# Patient Record
Sex: Female | Born: 1970 | Race: Black or African American | Hispanic: No | Marital: Married | Smoking: Never smoker
Health system: Southern US, Community
[De-identification: ages and names within clinical notes are randomized; demographics above are authoritative.]

## PROBLEM LIST (undated history)

## (undated) DIAGNOSIS — F419 Anxiety disorder, unspecified: Secondary | ICD-10-CM

## (undated) DIAGNOSIS — R6889 Other general symptoms and signs: Secondary | ICD-10-CM

## (undated) DIAGNOSIS — K219 Gastro-esophageal reflux disease without esophagitis: Secondary | ICD-10-CM

## (undated) DIAGNOSIS — M238X9 Other internal derangements of unspecified knee: Secondary | ICD-10-CM

## (undated) DIAGNOSIS — J069 Acute upper respiratory infection, unspecified: Secondary | ICD-10-CM

## (undated) DIAGNOSIS — M5432 Sciatica, left side: Secondary | ICD-10-CM

## (undated) DIAGNOSIS — Z8632 Personal history of gestational diabetes: Secondary | ICD-10-CM

## (undated) DIAGNOSIS — I10 Essential (primary) hypertension: Secondary | ICD-10-CM

## (undated) DIAGNOSIS — IMO0002 Reserved for concepts with insufficient information to code with codable children: Secondary | ICD-10-CM

## (undated) DIAGNOSIS — E079 Disorder of thyroid, unspecified: Secondary | ICD-10-CM

## (undated) DIAGNOSIS — R011 Cardiac murmur, unspecified: Secondary | ICD-10-CM

## (undated) DIAGNOSIS — B9789 Other viral agents as the cause of diseases classified elsewhere: Secondary | ICD-10-CM

## (undated) DIAGNOSIS — I499 Cardiac arrhythmia, unspecified: Secondary | ICD-10-CM

## (undated) DIAGNOSIS — R002 Palpitations: Secondary | ICD-10-CM

## (undated) DIAGNOSIS — M170 Bilateral primary osteoarthritis of knee: Secondary | ICD-10-CM

## (undated) DIAGNOSIS — N912 Amenorrhea, unspecified: Secondary | ICD-10-CM

## (undated) DIAGNOSIS — N189 Chronic kidney disease, unspecified: Secondary | ICD-10-CM

## (undated) DIAGNOSIS — N390 Urinary tract infection, site not specified: Secondary | ICD-10-CM

## (undated) DIAGNOSIS — H9209 Otalgia, unspecified ear: Secondary | ICD-10-CM

## (undated) DIAGNOSIS — H6090 Unspecified otitis externa, unspecified ear: Secondary | ICD-10-CM

## (undated) DIAGNOSIS — R1013 Epigastric pain: Secondary | ICD-10-CM

## (undated) DIAGNOSIS — N926 Irregular menstruation, unspecified: Secondary | ICD-10-CM

## (undated) DIAGNOSIS — M533 Sacrococcygeal disorders, not elsewhere classified: Secondary | ICD-10-CM

## (undated) DIAGNOSIS — M545 Low back pain: Secondary | ICD-10-CM

## (undated) HISTORY — DX: Other viral agents as the cause of diseases classified elsewhere: B97.89

## (undated) HISTORY — DX: Epigastric pain: R10.13

## (undated) HISTORY — DX: Anxiety disorder, unspecified: F41.9

## (undated) HISTORY — DX: Essential (primary) hypertension: I10

## (undated) HISTORY — DX: Cardiac arrhythmia, unspecified: I49.9

## (undated) HISTORY — DX: Chronic kidney disease, unspecified: N18.9

## (undated) HISTORY — DX: Sciatica, left side: M54.32

## (undated) HISTORY — DX: Personal history of gestational diabetes: Z86.32

## (undated) HISTORY — DX: Gastro-esophageal reflux disease without esophagitis: K21.9

## (undated) HISTORY — DX: Unspecified otitis externa, unspecified ear: H60.90

## (undated) HISTORY — DX: Sacrococcygeal disorders, not elsewhere classified: M53.3

## (undated) HISTORY — DX: Reserved for concepts with insufficient information to code with codable children: IMO0002

## (undated) HISTORY — DX: Bilateral primary osteoarthritis of knee: M17.0

## (undated) HISTORY — DX: Disorder of thyroid, unspecified: E07.9

## (undated) HISTORY — DX: Urinary tract infection, site not specified: N39.0

## (undated) HISTORY — DX: Cardiac murmur, unspecified: R01.1

## (undated) HISTORY — DX: Other general symptoms and signs: R68.89

## (undated) HISTORY — DX: Palpitations: R00.2

## (undated) HISTORY — DX: Acute upper respiratory infection, unspecified: J06.9

## (undated) HISTORY — PX: UMBILICAL HERNIA REPAIR: SHX196

## (undated) HISTORY — DX: Otalgia, unspecified ear: H92.09

## (undated) HISTORY — DX: Other internal derangements of unspecified knee: M23.8X9

## (undated) HISTORY — DX: Irregular menstruation, unspecified: N92.6

## (undated) HISTORY — DX: Amenorrhea, unspecified: N91.2

## (undated) HISTORY — DX: Low back pain: M54.5

## (undated) HISTORY — PX: HERNIA REPAIR: SHX51

---

## 2005-10-29 ENCOUNTER — Ambulatory Visit: Payer: Self-pay | Admitting: Family Medicine

## 2006-01-14 ENCOUNTER — Ambulatory Visit: Payer: Self-pay | Admitting: Family Medicine

## 2006-01-14 ENCOUNTER — Other Ambulatory Visit: Admission: RE | Admit: 2006-01-14 | Discharge: 2006-01-14 | Payer: Self-pay | Admitting: Family Medicine

## 2006-03-26 ENCOUNTER — Ambulatory Visit (HOSPITAL_BASED_OUTPATIENT_CLINIC_OR_DEPARTMENT_OTHER): Admission: RE | Admit: 2006-03-26 | Discharge: 2006-03-26 | Payer: Self-pay | Admitting: Surgery

## 2008-04-27 ENCOUNTER — Ambulatory Visit: Payer: Self-pay | Admitting: Family Medicine

## 2008-07-27 ENCOUNTER — Ambulatory Visit: Payer: Self-pay | Admitting: Family Medicine

## 2010-09-04 ENCOUNTER — Ambulatory Visit: Payer: Self-pay | Admitting: Oncology

## 2010-09-12 ENCOUNTER — Ambulatory Visit: Payer: Self-pay | Admitting: Oncology

## 2010-10-05 ENCOUNTER — Ambulatory Visit: Payer: Self-pay | Admitting: Family Medicine

## 2010-10-19 ENCOUNTER — Ambulatory Visit: Payer: Self-pay | Admitting: Family Medicine

## 2011-05-22 ENCOUNTER — Ambulatory Visit: Payer: Self-pay | Admitting: General Surgery

## 2011-06-05 ENCOUNTER — Ambulatory Visit: Payer: Self-pay | Admitting: Family Medicine

## 2011-06-07 ENCOUNTER — Ambulatory Visit (INDEPENDENT_AMBULATORY_CARE_PROVIDER_SITE_OTHER): Payer: Self-pay | Admitting: Family Medicine

## 2011-06-07 ENCOUNTER — Encounter: Payer: Self-pay | Admitting: Family Medicine

## 2011-06-07 ENCOUNTER — Ambulatory Visit (INDEPENDENT_AMBULATORY_CARE_PROVIDER_SITE_OTHER)
Admission: RE | Admit: 2011-06-07 | Discharge: 2011-06-07 | Disposition: A | Discharge status: Home / Self Care | Payer: Self-pay | Source: Ambulatory Visit | Attending: Family Medicine | Admitting: Family Medicine

## 2011-06-07 ENCOUNTER — Other Ambulatory Visit (HOSPITAL_COMMUNITY)
Admission: RE | Admit: 2011-06-07 | Discharge: 2011-06-07 | Disposition: A | Discharge status: Home / Self Care | Payer: 59 | Source: Ambulatory Visit | Attending: Family Medicine | Admitting: Family Medicine

## 2011-06-07 ENCOUNTER — Other Ambulatory Visit: Payer: Self-pay | Admitting: Family Medicine

## 2011-06-07 ENCOUNTER — Encounter: Payer: Self-pay | Admitting: *Deleted

## 2011-06-07 VITALS — BP 110/70 | HR 67 | Temp 98.7°F | Ht 63.0 in | Wt 154.5 lb

## 2011-06-07 DIAGNOSIS — Z0000 Encounter for general adult medical examination without abnormal findings: Secondary | ICD-10-CM | POA: Insufficient documentation

## 2011-06-07 DIAGNOSIS — H60399 Other infective otitis externa, unspecified ear: Secondary | ICD-10-CM

## 2011-06-07 DIAGNOSIS — M545 Low back pain, unspecified: Secondary | ICD-10-CM

## 2011-06-07 DIAGNOSIS — H6090 Unspecified otitis externa, unspecified ear: Secondary | ICD-10-CM

## 2011-06-07 DIAGNOSIS — Z136 Encounter for screening for cardiovascular disorders: Secondary | ICD-10-CM

## 2011-06-07 DIAGNOSIS — Z1159 Encounter for screening for other viral diseases: Secondary | ICD-10-CM | POA: Insufficient documentation

## 2011-06-07 DIAGNOSIS — Z01419 Encounter for gynecological examination (general) (routine) without abnormal findings: Secondary | ICD-10-CM | POA: Insufficient documentation

## 2011-06-07 DIAGNOSIS — E079 Disorder of thyroid, unspecified: Secondary | ICD-10-CM | POA: Insufficient documentation

## 2011-06-07 HISTORY — DX: Unspecified otitis externa, unspecified ear: H60.90

## 2011-06-07 HISTORY — DX: Low back pain, unspecified: M54.50

## 2011-06-07 LAB — LIPID PANEL
Cholesterol: 124 mg/dL (ref 0–200)
HDL: 52.7 mg/dL (ref >39.00)
VLDL: 6 mg/dL (ref 0.0–40.0)

## 2011-06-07 LAB — BASIC METABOLIC PANEL
Calcium: 9.3 mg/dL (ref 8.4–10.5)
GFR: 97.34 mL/min (ref >60.00)
Potassium: 3.6 mEq/L (ref 3.5–5.1)
Sodium: 140 mEq/L (ref 135–145)

## 2011-06-07 LAB — TSH: TSH: 0.45 u[IU]/mL (ref 0.35–5.50)

## 2011-06-07 MED ORDER — CIPROFLOXACIN-DEXAMETHASONE 0.3-0.1 % OT SUSP: 4.0000 [drp] | Freq: Two times a day (BID) | OTIC | Status: AC

## 2011-06-07 NOTE — Patient Instructions (Signed)
Wonderful to meet you. We will call you with results of lab tests next week.

## 2011-06-07 NOTE — Progress Notes (Signed)
Subjective:    Patient ID: Jennifer Ware, female    DOB: 12-Aug-1971, 40 y.o.   MRN: [id number]  HPI  40 yo here female here to establish care and for CPX.  G3P3, no h/o abnormal pap smears. Had mammogram last week, normal.  Per pt, getting them every 6 months to follow some benign appearing calcifications (awaiting records).  Ear itching- started swimming a couple of months ago.  Since then, itching bilateral ear canals, right>left.  Sometimes painful to manipulation.  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  Noticed a knot at left lower lumbar area, becoming painful to touch and manipulation. At time, she feels some pain radiating to both sides, never down her legs. No LE weakness.  Patient Active Problem List  Diagnoses  . Thyroid disease  . Otitis externa  . Low back pain  . Routine general medical examination at a health care facility   Past Medical History  Diagnosis Date  . Thyroid disease   . Hx gestational diabetes    No past surgical history on file. History  Substance Use Topics  . Smoking status: Never Smoker   . Smokeless tobacco: Not on file  . Alcohol Use: Not on file   No family history on file. No Known Allergies No current outpatient prescriptions on file prior to visit.   The PMH, PSH, Social History, Family History, Medications, and allergies have been reviewed in [facility], and have been updated if relevant.    Review of Systems See HPI Patient reports no  vision/ hearing changes,anorexia, weight change, fever ,adenopathy, persistant / recurrent hoarseness, swallowing issues, chest pain, edema,persistant / recurrent cough, hemoptysis, dyspnea(rest, exertional, paroxysmal nocturnal), gastrointestinal  bleeding (melena, rectal bleeding), abdominal pain, excessive heart burn, GU symptoms(dysuria, hematuria, pyuria, voiding/incontinence  Issues) syncope, focal weakness, severe memory loss, concerning skin lesions, depression,  anxiety, abnormal bruising/bleeding, major joint swelling, breast masses or abnormal vaginal bleeding.       Objective:   Physical Exam BP 110/70  Pulse 67  Temp(Src) 98.7 F (37.1 C) (Oral)  Ht 5\' 3"  (1.6 m)  Wt 154 lb 8 oz (70.081 kg)  BMI 27.37 kg/m2  LMP 06/04/2011  General:  Well-developed,well-nourished,in no acute distress; alert,appropriate and cooperative throughout examination Head:  normocephalic and atraumatic.   Eyes:  vision grossly intact, pupils equal, pupils round, and pupils reactive to light.   Ears:  Crusting and erythema of extrernal canals bilaterally, mild tenderness of manipulation   Nose:  no external deformity.   Mouth:  good dentition.   Neck:  No deformities, masses, or tenderness noted. Breasts:  No mass, nodules, thickening, tenderness, bulging, retraction, inflamation, nipple discharge or skin changes noted.   Lungs:  Normal respiratory effort, chest expands symmetrically. Lungs are clear to auscultation, no crackles or wheezes. Heart:  Normal rate and regular rhythm. S1 and S2 normal without gallop, murmur, click, rub or other extra sounds. Abdomen:  Bowel sounds positive,abdomen soft and non-tender without masses, organomegaly or hernias noted. Rectal:  no external abnormalities.   Genitalia:  Pelvic Exam:        External: normal female genitalia without lesions or masses        Vagina: normal without lesions or masses        Cervix: normal without lesions or masses        Adnexa: normal bimanual exam without masses or fullness        Uterus: normal by palpation  Pap smear: performed Msk: palpable firm area, left LSC, neg slr bilaterally Extremities:  No clubbing, cyanosis, edema, or deformity noted with normal full range of motion of all joints.   Neurologic:  alert & oriented X3 and gait normal.   Skin:  Intact without suspicious lesions or rashes Cervical Nodes:  No lymphadenopathy noted Axillary Nodes:  No palpable  lymphadenopathy Psych:  Cognition and judgment appear intact. Alert and cooperative with normal attention span and concentration. No apparent delusions, illusions, hallucinations    Assessment & Plan:   1. Routine general medical examination at a health care facility  Reviewed preventive care protocols, scheduled due services, and updated immunizations Discussed nutrition, exercise, diet, and healthy lifestyle.  Cytology -Pap Smear, Basic Metabolic Panel (BMET)  2. Otitis externa   New.  Ciprodex x 7 days.  Advised trying to keep ears dry after swimming. The patient indicates understanding of these issues and agrees with the plan.    3. Low back pain   Unclear if this is cyst vs muscle spasm. Will get xray to evaluate further. DG Lumbar Spine Complete

## 2011-06-12 ENCOUNTER — Telehealth: Payer: Self-pay | Admitting: Family Medicine

## 2011-06-12 ENCOUNTER — Encounter: Payer: Self-pay | Admitting: *Deleted

## 2011-06-12 NOTE — Telephone Encounter (Signed)
That is a good question and I'm not sure. It felt muscular to me especially since the pain can go into here legs.  I cannot say for sure that it is not a cyst. We could try to do an ultrasound for further evaluation. It is good that the xray was negative.

## 2011-06-12 NOTE — Telephone Encounter (Signed)
Called Pt to schedule Ortho appt. Patient says that you were unclear whether the knot on her back was a cyst so you recommended the xray to confirm. After having the xray now its still not clear what the knot is. She feels like she needs to see a different Dr and not an Ortho. A Dr told her once that it was a cyst, could you pls advise her how to confirm what the knot is. Derm or Surgeon she was thinking but wanted to hear it from you.

## 2011-06-13 NOTE — Telephone Encounter (Signed)
Noted.  It should be for 7-14 days depending on how she is feeling. OK to stop after 7 days if symptoms improved.

## 2011-06-13 NOTE — Telephone Encounter (Signed)
Patient advised as instructed via telephone.  She will continue the drops for another 7 days and contact us if she is no better.

## 2011-06-13 NOTE — Telephone Encounter (Signed)
Called patient to give her your response about the knot on her back. She now wants to think about the [place] and will call me back. But she also asked me to ask you how long she should use the antibiotic ear drops, it just tells her how many drops to put in each ear but not for how long to use it? Pls advise.

## 2011-07-18 ENCOUNTER — Telehealth: Payer: Self-pay | Admitting: Internal Medicine

## 2011-07-18 DIAGNOSIS — H9209 Otalgia, unspecified ear: Secondary | ICD-10-CM

## 2011-07-18 DIAGNOSIS — H6090 Unspecified otitis externa, unspecified ear: Secondary | ICD-10-CM

## 2011-07-18 HISTORY — DX: Otalgia, unspecified ear: H92.09

## 2011-07-18 NOTE — Telephone Encounter (Signed)
Referral placed.

## 2011-07-18 NOTE — Telephone Encounter (Signed)
Patient advised as instructed via telephone.  She would like a referral to ENT.  She will wait to hear from [facility].

## 2011-07-18 NOTE — Telephone Encounter (Signed)
Patient called and stated she finished her antibiotic but both of her ears are still itching and would like to know what to do because the problem is still there.  Please advise.

## 2011-07-18 NOTE — Telephone Encounter (Signed)
Left message on machine at home for patient to return call. 

## 2011-07-18 NOTE — Telephone Encounter (Signed)
I last saw her in October so I would need to re evaluate her ears to be able to advise.

## 2011-09-18 ENCOUNTER — Ambulatory Visit: Payer: 59 | Admitting: Family Medicine

## 2011-09-24 ENCOUNTER — Encounter: Payer: Self-pay | Admitting: Family Medicine

## 2011-09-24 ENCOUNTER — Ambulatory Visit (INDEPENDENT_AMBULATORY_CARE_PROVIDER_SITE_OTHER): Payer: 59 | Admitting: Family Medicine

## 2011-09-24 VITALS — BP 122/80 | HR 69 | Temp 98.1°F | Ht 65.0 in | Wt 155.5 lb

## 2011-09-24 DIAGNOSIS — Z020 Encounter for examination for admission to educational institution: Secondary | ICD-10-CM

## 2011-09-24 DIAGNOSIS — Z0289 Encounter for other administrative examinations: Secondary | ICD-10-CM

## 2011-09-24 LAB — POCT URINALYSIS DIPSTICK
Blood, UA: NEGATIVE
Ketones, UA: NEGATIVE
Protein, UA: NEGATIVE
Spec Grav, UA: 1.01

## 2011-09-24 NOTE — Progress Notes (Signed)
  Subjective:    Patient ID: Jennifer Ware, female    DOB: Dec 12, 1970, 41 y.o.   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  Brings forms with her today. Requires UA and UTD immunizations.    Patient Active Problem List  Diagnoses  . Thyroid disease  . Otitis externa  . Low back pain  . Routine general medical examination at a health care facility  . Ear discomfort   Past Medical History  Diagnosis Date  . Thyroid disease   . Hx gestational diabetes    No past surgical history on file. History  Substance Use Topics  . Smoking status: Never Smoker   . Smokeless tobacco: Not on file  . Alcohol Use: Not on file   No family history on file. No Known Allergies Current Outpatient Prescriptions on File Prior to Visit  Medication Sig Dispense Refill  . Multiple Vitamin (MULTIVITAMIN) tablet Take 1 tablet by mouth daily.         The PMH, PSH, Social History, Family History, Medications, and allergies have been reviewed in [facility], and have been updated if relevant.    Review of Systems See HPI      Objective:   Physical Exam BP 122/80  Pulse 69  Temp(Src) 98.1 F (36.7 C) (Oral)  Ht 5\' 5"  (1.651 m)  Wt 155 lb 8 oz (70.534 kg)  BMI 25.88 kg/m2  General:  Well-developed,well-nourished,in no acute distress; alert,appropriate and cooperative throughout examination Head:  normocephalic and atraumatic.   Eyes:  vision grossly intact, pupils equal, pupils round, and pupils reactive to light.   Nose:  no external deformity.   Mouth:  good dentition.   Neck:  No deformities, masses, or tenderness noted. Breasts:  No mass, nodules, thickening, tenderness, bulging, retraction, inflamation, nipple discharge or skin changes noted.   Lungs:  Normal respiratory effort, chest expands symmetrically. Lungs are clear to auscultation, no crackles or wheezes. Heart:  Normal rate and regular rhythm. S1 and S2 normal without gallop, murmur,  click, rub or other extra sounds. Extremities:  No clubbing, cyanosis, edema, or deformity noted with normal full range of motion of all joints.   Neurologic:  alert & oriented X3 and gait normal.   Skin:  Intact without suspicious lesions or rashes Psych:  Cognition and judgment appear intact. Alert and cooperative with normal attention span and concentration. No apparent delusions, illusions, hallucinations    Assessment & Plan:   1.  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 Immunzations UTD. Forms filled out and returned to pt.

## 2011-09-24 NOTE — Progress Notes (Signed)
Addended by: Gilmer Mor on: 09/24/2011 10:00 AM   Modules accepted: Orders

## 2011-10-22 ENCOUNTER — Ambulatory Visit (INDEPENDENT_AMBULATORY_CARE_PROVIDER_SITE_OTHER): Payer: 59 | Admitting: Family Medicine

## 2011-10-22 ENCOUNTER — Encounter: Payer: Self-pay | Admitting: Family Medicine

## 2011-10-22 VITALS — BP 120/80 | HR 64 | Temp 98.7°F | Wt 160.0 lb

## 2011-10-22 DIAGNOSIS — R300 Dysuria: Secondary | ICD-10-CM

## 2011-10-22 DIAGNOSIS — N912 Amenorrhea, unspecified: Secondary | ICD-10-CM | POA: Insufficient documentation

## 2011-10-22 DIAGNOSIS — R1013 Epigastric pain: Secondary | ICD-10-CM

## 2011-10-22 HISTORY — DX: Amenorrhea, unspecified: N91.2

## 2011-10-22 LAB — POCT URINALYSIS DIPSTICK
Bilirubin, UA: NEGATIVE
Ketones, UA: NEGATIVE
Spec Grav, UA: >=1.03
pH, UA: 6

## 2011-10-22 LAB — POCT URINE PREGNANCY: Preg Test, Ur: NEGATIVE

## 2011-10-22 MED ORDER — ESOMEPRAZOLE MAGNESIUM 40 MG PO CPDR: 40.0000 mg | DELAYED_RELEASE_CAPSULE | Freq: Every day | ORAL | Status: DC

## 2011-10-22 MED ORDER — CIPROFLOXACIN HCL 500 MG PO TABS: 500.0000 mg | ORAL_TABLET | Freq: Two times a day (BID) | ORAL | Status: AC

## 2011-10-22 NOTE — Progress Notes (Signed)
SUBJECTIVE: Jennifer Ware is a 41 y.o. female who complains of urinary frequency, urgency and dysuria x 10 days, without flank pain, fever, chills, or abnormal vaginal discharge or bleeding.   LMP was 09/15/11.  Also, when stomach is empty, feels a "burning sensation" in her stomach and throat. No nausea, vomiting, fevers or diarrhea.  Patient Active Problem List  Diagnoses  . Thyroid disease  . Otitis externa  . Low back pain  . Routine general medical examination at a health care facility  . Ear discomfort  . Amenorrhea   Past Medical History  Diagnosis Date  . Thyroid disease   . Hx gestational diabetes    No past surgical history on file. History  Substance Use Topics  . Smoking status: Never Smoker   . Smokeless tobacco: Not on file  . Alcohol Use: Not on file   No family history on file. No Known Allergies Current Outpatient Prescriptions on File Prior to Visit  Medication Sig Dispense Refill  . Multiple Vitamin (MULTIVITAMIN) tablet Take 1 tablet by mouth daily.         The PMH, PSH, Social History, Family History, Medications, and allergies have been reviewed in [facility], and have been updated if relevant.  OBJECTIVE:  BP 120/80  Pulse 64  Temp(Src) 98.7 F (37.1 C) (Oral)  Wt 160 lb (72.576 kg)  Appears well, in no apparent distress.  Vital signs are normal. The abdomen is soft without tenderness, guarding, mass, rebound or organomegaly. No CVA tenderness or inguinal adenopathy noted. Urine dipstick shows positive for RBC's and positive for leukocytes.    ASSESSMENT/Plan  1.  Dysuria- :UTI uncomplicated without evidence of pyelonephritis-  cipro 500 mg twice daily x 3 days   - also push fluids, may use Pyridium OTC prn. Call or return to clinic prn if these symptoms worsen or fail to improve as anticipated.  2.  Amenorrhea- ? Perimenopause.  Advised to keep journal and we will also want to repeat her Upreg in a few weeks. The patient indicates understanding of  these issues and agrees with the plan.   3.  Dyspepsia-  Trial of nexium 40 mg daily (samples given). See pt instructions for details.

## 2011-10-22 NOTE — Patient Instructions (Signed)
Good to see you. Take your cipro as directed for your urinary tract infection. Increase fiber and water, and try over the counter gas-x or beano for bloating.  Nexium- 1 tablet daily  for next 2 weeks. Let me know how you are feeling in two weeks.

## 2011-10-28 ENCOUNTER — Telehealth: Payer: Self-pay | Admitting: Family Medicine

## 2011-10-28 NOTE — Telephone Encounter (Signed)
[facility] Triage Call Report Triage Record Num: [id number] Operator: Jennifer Ware Patient Name: Jennifer Ware Call Date & Time: 10/28/2011 11:34:06AM Patient Phone: [phone] PCP: Jennifer Ware Patient Gender: Female PCP Fax : [phone] Patient DOB: 08/25/1970 Practice Name: [facility] Reason for Call: Caller: Jennifer Ware/Patient; PCP: Jennifer Ware Nestor Ramp); CB#: [phone]; ; ; Call regarding follow up Reflux Indigestion; states has been taking nexium for ~ 1 week with no improvement in symptoms. Was triaged 10/25/11 and note was sent to office for callback by staff for possible workin appt, but did not hear back from staff at all. Would like appt today if possible. Per Epic, no appts available today; appt sched 10/29/11 0900 with Dr. Dayton Ware. Protocol(s) Used: Heartburn Recommended Outcome per Protocol: See Provider within 24 hours Reason for Outcome: Provider diagnosed heartburn AND symptoms not responding to recommended treatment plan Care Advice: Call EMS 911 if having any of the following symptoms: chest, neck, jaw, arm, shoulder, or upper back pain; or shortness of breath at rest. ~ Do not take aspirin, ibuprofen, ketoprofen, naproxen, etc., or other pain relieving medications until consulting with provider. ~ Talk to provider immediately if having repeated episodes pain/discomfort in lower chest or back; indigestion not relieved with antacids; shortness of breath without chest pain; or unexplained fatigue, weakness or anxiety. ~ Call provider immediately if develop severe pain, black, tarry stools, bloody stools, blood-streaked or coffee ground-looking vomitus, or abdomen swollen. ~ ~ SYMPTOM / CONDITION MANAGEMENT ~ CAUTIONS Discontinue nonprescribed medications and complementary/alternative medications. Continue prescribed medication(s) at ordered dosage/frequency until discussed with provider. ~ Heartburn Relief (Dietary): - Avoid overeating. -  Eat smaller, more frequent meals and chew each bite thoroughly. - Avoid high fat, spicy or gas-producing foods. - Limit liquids/foods that are gastric irritants (fruit juices, caffeinated, carbonated or alcoholic beverages, chocolate and dairy products). - Eat at least three hours before bedtime. ~ Heartburn Relief (Positioning): - Avoid bending over at the waist or lying flat soon after eating. - Avoid clothing that is tight around the abdomen and waist. - Sleeping on stacked pillows or raising the head of bed on 6 inch blocks may help prevent reflux. ~ 03/

## 2011-10-29 ENCOUNTER — Encounter: Payer: Self-pay | Admitting: Family Medicine

## 2011-10-29 ENCOUNTER — Other Ambulatory Visit: Payer: Self-pay | Admitting: Family Medicine

## 2011-10-29 ENCOUNTER — Ambulatory Visit (INDEPENDENT_AMBULATORY_CARE_PROVIDER_SITE_OTHER): Payer: 59 | Admitting: Family Medicine

## 2011-10-29 ENCOUNTER — Encounter: Payer: Self-pay | Admitting: Gastroenterology

## 2011-10-29 DIAGNOSIS — R768 Other specified abnormal immunological findings in serum: Secondary | ICD-10-CM

## 2011-10-29 DIAGNOSIS — R1013 Epigastric pain: Secondary | ICD-10-CM

## 2011-10-29 DIAGNOSIS — R1084 Generalized abdominal pain: Secondary | ICD-10-CM | POA: Insufficient documentation

## 2011-10-29 DIAGNOSIS — R109 Unspecified abdominal pain: Secondary | ICD-10-CM

## 2011-10-29 LAB — COMPREHENSIVE METABOLIC PANEL
AST: 30 U/L (ref 0–37)
Albumin: 3.9 g/dL (ref 3.5–5.2)
Alkaline Phosphatase: 64 U/L (ref 39–117)
Glucose, Bld: 97 mg/dL (ref 70–99)
Potassium: 4.2 mEq/L (ref 3.5–5.1)
Sodium: 138 mEq/L (ref 135–145)
Total Protein: 7.1 g/dL (ref 6.0–8.3)

## 2011-10-29 MED ORDER — AMOXICILLIN 500 MG PO CAPS: 1000.0000 mg | ORAL_CAPSULE | Freq: Two times a day (BID) | ORAL | Status: AC

## 2011-10-29 MED ORDER — CLARITHROMYCIN 500 MG PO TABS: 500.0000 mg | ORAL_TABLET | Freq: Two times a day (BID) | ORAL | Status: AC

## 2011-10-29 NOTE — Progress Notes (Signed)
Addended by: Consuello Masse on: 10/29/2011 04:14 PM   Modules accepted: Orders

## 2011-10-29 NOTE — Progress Notes (Signed)
SUBJECTIVE: Jennifer Ware is a 41 y.o. female here for follow up.  Evaluated on 10/22/2011 with complaint of urinary frequency, urgency and dysuria x 10 days, without flank pain, fever, chills, or abnormal vaginal discharge or bleeding.  UA pos for LE- treated with 3 day course of cipro. Those symptoms have resolved.  Dyspepsia- was also complaining of a "burning sensation" in her stomach and throat when he stomach is empty. No nausea, vomiting, fevers or diarrhea.  No dark stools. Given samples of nexium and advised 2 week trial. She is here today after 1 week trial and burning no improved.  Also now has new symptoms- sharp attacks of diffuse abdominal pain that radiates to her back.  Last for seconds then goes away. When asked where she feels it, she points to her entire abdomen and pelvis.  Periods have also been very irregular- neg U preg last week.  Patient Active Problem List  Diagnoses  . Thyroid disease  . Otitis externa  . Low back pain  . Routine general medical examination at a health care facility  . Ear discomfort  . Amenorrhea  . Dyspepsia   Past Medical History  Diagnosis Date  . Thyroid disease   . Hx gestational diabetes    No past surgical history on file. History  Substance Use Topics  . Smoking status: Never Smoker   . Smokeless tobacco: Not on file  . Alcohol Use: Not on file   No family history on file. No Known Allergies Current Outpatient Prescriptions on File Prior to Visit  Medication Sig Dispense Refill  . ciprofloxacin (CIPRO) 500 MG tablet Take 1 tablet (500 mg total) by mouth 2 (two) times daily.  6 tablet  0  . esomeprazole (NEXIUM) 40 MG capsule Take 1 capsule (40 mg total) by mouth daily.  30 capsule  3  . Multiple Vitamin (MULTIVITAMIN) tablet Take 1 tablet by mouth daily.         The PMH, PSH, Social History, Family History, Medications, and allergies have been reviewed in [facility], and have been updated if relevant.  OBJECTIVE:  BP 120/70   Pulse 86  Temp(Src) 97.9 F (36.6 C) (Oral)  Ht 5\' 3"  (1.6 m)  Wt 159 lb 12.8 oz (72.485 kg)  BMI 28.31 kg/m2  SpO2 99%  Appears well, in no apparent distress.  Vital signs are normal. The abdomen is soft without tenderness, guarding, mass, rebound or organomegaly. No CVA tenderness or inguinal adenopathy noted.     ASSESSMENT/Plan 1. Abdominal pain  US Abdomen Complete, CT Abdomen Pelvis W Contrast, Comprehensive metabolic panel   Differential is wide and etiology unclear- less likely PUD given that PPI has not helped with symptoms. Given the intense and diffuse nature of pain, will order CT of abdomen and pelvis to evaluate further. CMET.

## 2011-10-29 NOTE — Patient Instructions (Signed)
Good to you. Please stop by to see Jennifer Ware on your way out.

## 2011-10-30 ENCOUNTER — Other Ambulatory Visit: Payer: 59

## 2011-10-31 ENCOUNTER — Telehealth: Payer: Self-pay | Admitting: *Deleted

## 2011-10-31 NOTE — Telephone Encounter (Signed)
On my desk

## 2011-10-31 NOTE — Telephone Encounter (Signed)
Pt has brought in a 2nd school form that she needs completed.  I filled in immunizations yesterday, now she needs the doctor's side completed.  Form is on your desk.

## 2011-11-19 ENCOUNTER — Telehealth: Payer: Self-pay | Admitting: Family Medicine

## 2011-11-19 DIAGNOSIS — IMO0002 Reserved for concepts with insufficient information to code with codable children: Secondary | ICD-10-CM

## 2011-11-19 NOTE — Telephone Encounter (Signed)
Advised pt, she agrees to referral, prefers to see someone in [place].

## 2011-11-19 NOTE — Telephone Encounter (Signed)
Noted. Will place referral to GYN.

## 2011-11-19 NOTE — Telephone Encounter (Signed)
Left message asking that patient call back 

## 2011-11-19 NOTE — Telephone Encounter (Signed)
Caller: Aliegha/Patient; PCP: Ruthe Mannan (Nestor Ramp); CB#: [phone]; ; ; Call regarding Vaginal Pain With Intercourse; Has Done All Suggestions by Dr. Dayton Martes; seen in office 10/29/11.  States would like referral to specialist if Dr. Dayton Martes believes it is necessary.  States has excruciating pain on intercourse.  Declines new triage or followup appt at this time.   INFO TO OFFICE FOR PROVIDER REVIEW/REFERRAL/CALLBACK.  MAY REACH PATIENT AT [phone] home, or [phone] cell.

## 2011-11-22 ENCOUNTER — Ambulatory Visit: Payer: Self-pay | Admitting: General Surgery

## 2011-11-25 ENCOUNTER — Encounter: Payer: Self-pay | Admitting: Gastroenterology

## 2011-11-25 ENCOUNTER — Ambulatory Visit (INDEPENDENT_AMBULATORY_CARE_PROVIDER_SITE_OTHER): Payer: 59 | Admitting: Gastroenterology

## 2011-11-25 ENCOUNTER — Ambulatory Visit (INDEPENDENT_AMBULATORY_CARE_PROVIDER_SITE_OTHER): Payer: 59

## 2011-11-25 ENCOUNTER — Other Ambulatory Visit: Payer: 59

## 2011-11-25 VITALS — BP 110/70 | HR 72 | Ht 63.0 in | Wt 160.6 lb

## 2011-11-25 DIAGNOSIS — A048 Other specified bacterial intestinal infections: Secondary | ICD-10-CM

## 2011-11-25 DIAGNOSIS — R52 Pain, unspecified: Secondary | ICD-10-CM

## 2011-11-25 DIAGNOSIS — R1013 Epigastric pain: Secondary | ICD-10-CM

## 2011-11-25 DIAGNOSIS — Z111 Encounter for screening for respiratory tuberculosis: Secondary | ICD-10-CM

## 2011-11-25 NOTE — Progress Notes (Signed)
HPI: This is a  very pleasant 41 year old woman who was born in [place]  Starting about 1 month ago.  She had these symptoms in past but thought it was gas pain.  Eating when very hungry will cause burning, distension.  Tried gas ex type medicines.  Eventually went to PCP, H. Pylori abx for 10 days.  She noticed an improvement after the abx (only one episode of pain since then) still has burning.   Currently not on PPI.  She noticed a mild loosening of her stools after the antibiotics. Not exactly diarrhea. Intermittent tylenol only.  No nsaids.  No caffeine.  Blood tests last month showed H. pylori antigen was positive. She was put on Prevpac type antibiotics.  Review of systems: Pertinent positive and negative review of systems were noted in the above HPI section. Complete review of systems was performed and was otherwise normal.    Past Medical History  Diagnosis Date  . Thyroid disease   . Hx gestational diabetes     Past Surgical History  Procedure Date  . Cesarean section   . Umbilical hernia repair     Current Outpatient Prescriptions  Medication Sig Dispense Refill  . Multiple Vitamin (MULTIVITAMIN) tablet Take 1 tablet by mouth daily.          Allergies as of 11/25/2011  . (No Known Allergies)    Family History  Problem Relation Age of Onset  . Asthma Father     History   Social History  . Marital Status: Married    Spouse Name: N/A    Number of Children: 3  . Years of Education: N/A   Occupational History  . LPN    Social History Main Topics  . Smoking status: Never Smoker   . Smokeless tobacco: Never Used  . Alcohol Use: No  . Drug Use: No  . Sexually Active: Not on file   Other Topics Concern  . 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         Physical Exam: BP 110/70  Pulse 72  Ht 5\' 3"  (1.6 m)  Wt 160 lb 9.6 oz (72.848 kg)  BMI  28.45 kg/m2  LMP 11/11/2011 Constitutional: generally well-appearing Psychiatric: alert and oriented x3 Eyes: extraocular movements intact Mouth: oral pharynx moist, no lesions Neck: supple no lymphadenopathy Cardiovascular: heart regular rate and rhythm Lungs: clear to auscultation bilaterally Abdomen: soft, nontender, nondistended, no obvious ascites, no peritoneal signs, normal bowel sounds Extremities: no lower extremity edema bilaterally Skin: no lesions on visible extremities    Assessment and plan: 41 y.o. female with  dyspepsia, abdominal burning, H. pylori positive by serology.  Prevpac type antibiotics clearly helped her symptoms but have not completely resolved them. She is still bothered by burning periodically. We will proceed with EGD at her soonest convenience to check for persistent H. pyloric, peptic ulcer disease. I recommended she try once daily proton pump inhibitor for now.   I explained to her the antibiotics will often change the character of the bowel movements but this is usually temporary. She is not describing significant diarrhea that is generally seen with Clostridium difficile. I recommended she just observe her bowels and if they worsen or do not get back to normal the next few weeks and we consider stool testing.

## 2011-11-25 NOTE — Patient Instructions (Signed)
You will be set up for an upper endoscopy in [facility] for epiagstric pain, burning. Start once daily prevacid or prilosec or protonix (or their generic equivalent). Best taken 20-30 min before breakfast meal.

## 2011-11-26 ENCOUNTER — Encounter: Payer: 59 | Admitting: Obstetrics & Gynecology

## 2011-11-27 ENCOUNTER — Encounter: Payer: Self-pay | Admitting: Obstetrics & Gynecology

## 2011-11-27 ENCOUNTER — Telehealth: Payer: Self-pay | Admitting: Gastroenterology

## 2011-11-27 ENCOUNTER — Encounter: Payer: Self-pay | Admitting: *Deleted

## 2011-11-27 ENCOUNTER — Ambulatory Visit (INDEPENDENT_AMBULATORY_CARE_PROVIDER_SITE_OTHER): Payer: 59 | Admitting: Obstetrics & Gynecology

## 2011-11-27 VITALS — BP 114/78 | HR 72 | Ht 63.0 in | Wt 155.0 lb

## 2011-11-27 DIAGNOSIS — IMO0002 Reserved for concepts with insufficient information to code with codable children: Secondary | ICD-10-CM

## 2011-11-27 DIAGNOSIS — N941 Unspecified dyspareunia: Secondary | ICD-10-CM

## 2011-11-27 MED ORDER — NITROFURANTOIN MONOHYD MACRO 100 MG PO CAPS: ORAL_CAPSULE | ORAL | Status: DC

## 2011-11-27 NOTE — Telephone Encounter (Signed)
no

## 2011-11-27 NOTE — Patient Instructions (Signed)
Dyspareunia Dyspareunia is pain during sexual intercourse. It is most common in women, but it also happens in men.  CAUSES  Female The pain from this condition is usually felt when anything is put into the vagina, but any part of the genitals may cause pain during sex. Even sitting or wearing pants can cause pain. Sometimes, a cause cannot be found. Some causes of pain during intercourse are:  Infections of the skin around the vagina.   Vaginal infections, such as a yeast, bacterial, or viral infection.   Vaginismus. This is the inability to have anything put in the vagina even when the woman wants it to happen. There is an automatic muscle contraction and pain. The pain of the muscle contraction can be so severe that intercourse is impossible.   Allergic reaction from spermicides, semen, condoms, scented tampons, soaps, douches, and vaginal sprays.   A fluid-filled sac (cyst) on the Bartholin or Skene glands, located at the opening of the vagina.   Scar tissue in the vagina from a surgically enlarged opening (episiotomy) or tearing after delivering a baby.   Vaginal dryness. This is more common in menopause. The normal secretions of the vagina are decreased. Changes in estrogen levels and increased difficulty becoming aroused can cause painful sex. Vaginal dryness can also happen when taking birth control pills.   Thinning of the tissue (atrophy) of the vulva and vagina. This makes the area thinner, smaller, unable to stretch to accommodate a penis, and prone to infection and tearing.   Vulvar vestibulitis or vestibulodynia.This is a condition that causes pain involving the area around the entrance to the vagina.The most common cause in young women is birth control pills.Women with low estrogen levels (postmenopausal women) may also experience this.Other causes include allergic reactions, too many nerve endings, skin conditions, and pelvic muscles that cannot relax.   Vulvar dermatoses.  This includes skin conditions such as lichen sclerosus and lichen planus.   Lack of foreplay to lubricate the vagina. This can cause vaginal dryness.   Noncancerous tumors (fibroids) in the uterus.   Uterus lining tissue growing outside the uterus (endometriosis).   Pregnancy that starts in the fallopian tube (tubal pregnancy).   Pregnancy or breastfeeding your baby. This can cause vaginal dryness.   A tilting or prolapse of the uterus. Prolapse is when weak and stretched muscles around the uterus allow it to fall into the vagina.   Problems with the ovaries, cysts, or scar tissue. This may be worse with certain sexual positions.   Previous surgeries causing adhesions or scar tissue in the vagina or pelvis.   Bladder and intestinal problems.   Psychological problems (such as depression or anxiety). This may make pain worse.   Negative attitudes about sex, experiencing rape, sexual assault, and misinformation about sex. These issues are often related to some types of pain.   Previous pelvic infection, causing scar tissue in the pelvis and on the female organs.   Cyst or tumor on the ovary.   Cancer of the female organs.   Certain medicines.   Medical problems such as diabetes, arthritis, or thyroid disease.  DIAGNOSIS   Your caregiver will take a history and have you describe where the pain is located (outside the vagina, in the vagina, in the pelvis). You may be asked when you experience pain, such as with penetration or with thrusting.   Following this, your caregiver will do a physical exam. Let your caregiver know if the exam is too painful.  During the final part of the female exam, your caregiver will feel your uterus and ovaries with one hand on the abdomen and one finger in your vagina. This is a pelvic exam.   Blood tests, a Pap test, cultures for infection, an ultrasound test, and X-rays may be done. You may need to see a specialist for female problems  (gynecologist).   Your caregiver may do a CT scan, MRI, or laparoscopy. Laparoscopy is a procedure to look into the pelvis with a lighted tube, through a cut (incision) in the abdomen.  TREATMENT  Your caregiver can help you determine the best course of treatment. Sometimes, more testing is done. Continue with the suggested testing until your caregiver feels sure about your diagnosis and how to treat it. Sometimes, it is difficult to find the reason for the pain. The search for the cause and treatment can be frustrating. Treatment often takes several weeks to a few months before you notice any improvement. You may also need to avoid sexual activity until symptoms improve.Continuing to have sex when it hurts can delay healing and actually make the problem worse. The treatment depends on the cause of the pain. Treatment may include:  Medicines such as antibiotics, vaginal or skin creams, hormones, or antidepressants.   Minor or major surgery.   Psychological counseling or group therapy.   Kegel exercises and vaginal dilators to help certain cases of vaginismus (spasms). Do this only if recommended by your caregiver.Kegel exercises can make some problems worse.   Applying lubrication as recommended by your caregiver if you have dryness.   Sex therapy for you and your sex partner.  It is common for the pain to continue after the reason for the pain has been treated. Some reasons for this include a conditioned response. This means the person having the pain becomes so familiar with the pain that the pain continues as a response, even though the cause is removed. Sex therapy can help with this problem. HOME CARE INSTRUCTIONS   Follow your caregiver's instructions about taking medicines, tests, counseling, and follow-up treatment.   Do not use scented tampons, douches, vaginal sprays, or soaps.   Use water-based lubricants for dryness. Oil lubricants can cause irritation.   Do not use  spermicides or condoms that irritate you.   Openly discuss with your partner your sexual experience, your desires, foreplay, and different sexual positions for a more comfortable and enjoyable sexual relationship.   Join group sessions for therapy, if needed.   Practice safe sex at all times.   Empty your bladder before having intercourse.   Try different positions during sexual intercourse.   Take over-the-counter pain medicine recommended by your caregiver before having sexual intercourse.   Do not wear pantyhose. Knee-high and thigh-high hose are okay.   Avoid scrubbing your vulva with a washcloth. Wash the area gently and pat dry with a towel.  SEEK MEDICAL CARE IF:   You develop vaginal bleeding after sexual intercourse.   You develop a lump at the opening of your vagina, even if it is not painful.   You have abnormal vaginal discharge.   You have vaginal dryness.   You have itching or irritation of the vulva or vagina.   You develop a rash or reaction to your medicine.  SEEK IMMEDIATE MEDICAL CARE IF:   You develop severe abdominal pain during or shortly after sexual intercourse. You could have a ruptured ovarian cyst or ruptured tubal pregnancy.   You have a fever.  You have painful or bloody urination.   You have painful sexual intercourse, and you never had it before.   You pass out after having sexual intercourse.  Document Released: 08/18/2007 Document Revised: 07/18/2011 Document Reviewed: 10/29/2010 [facility] Patient Information 2012 [place], [place].

## 2011-11-27 NOTE — Progress Notes (Signed)
History:  41 y.o. [id number] here today for evaluation of dyspareunia and an irregular menstrual period last month.  Dyspareunia has been present since her teenage years, pain is anterior and happens after intercourse.  She reports also being diagnosed with UTIs after intercourse needing multiple doses of antibiotics; reports she was suppression therapy with Macrobid for one year several years ago. Pain is not associated with fevers, nausea, vomiting, or other systemic symptoms. As for her irregular menstrual period; her period last month was two weeks late, had negative urine HCG; next period came around usual time of the months which was only two weeks after the irregular period.  LMP 11/11/11.  No other irregular bleeding, abdominal pain or other concerns.  The following portions of the patient's history were reviewed and updated as appropriate: allergies, current medications, past family history, past medical history, past social history, past surgical history and problem list.  Review of Systems:  Pertinent items are noted in HPI.  Objective:  Physical Exam Blood pressure 114/78, pulse 72, height 5\' 3"  (1.6 m), weight 155 lb (70.308 kg), last menstrual period 11/11/2011. Gen: NAD Abd: Soft, nontender and nondistended Pelvic: Normal appearing external genitalia; normal appearing vaginal mucosa and cervix.  Good bladder and rectum support. No lesions seen. Normal discharge.   Unable to palpate uterus/adnexae secondary to voluntary guarding. No palpable abnormal masses.  Assessment & Plan:  1) UTIs after intercourse: Patient was advised that this can be treated by taking antibiotics with each act of intercourse; Macrobid 100 mg recommended. Also recommended emptying bladder after intercourse.  If UTIs persist, may need evaluation by cystoscopy.  Will also get pelvic ultrasound given equivocal pelvic examination to rule out structural anomalies. 2) Irregular menstrual period x 1: Likely isolated  anovulatory bleeding, will follow up pelvic ultrasound for further evaluation.  If continues, will need endometrial biopsy.  Bleeding precautions reviewed.

## 2011-11-29 ENCOUNTER — Other Ambulatory Visit: Payer: 59 | Admitting: Gastroenterology

## 2011-11-29 ENCOUNTER — Ambulatory Visit (HOSPITAL_COMMUNITY): Payer: 59

## 2011-12-05 ENCOUNTER — Telehealth: Payer: Self-pay | Admitting: Family Medicine

## 2011-12-05 NOTE — Telephone Encounter (Signed)
I am out of the office until Monday but will review it when I get back.  I have not heard an irregular heartbeat when I evaluated her but these arrythmias can come and go.  Let's have her come in to see me and we can do an EKG.

## 2011-12-05 NOTE — Telephone Encounter (Signed)
Pt is returning from an appt with General Surgeon Dr. Evette Cristal on [address]. He advised pt during exam she had an irregular heart beat. He advised her to f/u with her MD. Pt states is is sending over his evaluation. She wants to know if it has arrived yet. Pt wants to know the next step. Pt is asymptomatic at this time. Rn agreed to send this info to MD and advised pt to f/u.

## 2011-12-06 ENCOUNTER — Ambulatory Visit (AMBULATORY_SURGERY_CENTER): Payer: 59 | Admitting: Gastroenterology

## 2011-12-06 ENCOUNTER — Encounter: Payer: Self-pay | Admitting: Gastroenterology

## 2011-12-06 ENCOUNTER — Other Ambulatory Visit: Payer: 59 | Admitting: Gastroenterology

## 2011-12-06 ENCOUNTER — Telehealth: Payer: Self-pay | Admitting: Family Medicine

## 2011-12-06 VITALS — BP 117/78 | HR 76 | Temp 97.5°F | Resp 22

## 2011-12-06 DIAGNOSIS — K2970 Gastritis, unspecified, without bleeding: Secondary | ICD-10-CM

## 2011-12-06 DIAGNOSIS — A048 Other specified bacterial intestinal infections: Secondary | ICD-10-CM

## 2011-12-06 DIAGNOSIS — K2960 Other gastritis without bleeding: Secondary | ICD-10-CM

## 2011-12-06 DIAGNOSIS — R1013 Epigastric pain: Secondary | ICD-10-CM

## 2011-12-06 DIAGNOSIS — R52 Pain, unspecified: Secondary | ICD-10-CM

## 2011-12-06 MED ORDER — SODIUM CHLORIDE 0.9 % IV SOLN: 500.0000 mL | INTRAVENOUS | Status: DC

## 2011-12-06 NOTE — Patient Instructions (Addendum)
You received fentanyl and versed during today's procedure/test. One of these is a narcotic pain medicine and the other is a benzodiazepine similar to valium.  Discharge instructions given with verbal understanding. Handout on Gastritis given. Resume previous medications.YOU HAD AN ENDOSCOPIC PROCEDURE TODAY AT THE [facility]: Refer to the procedure report that was given to you for any specific questions about what was found during the examination.  If the procedure report does not answer your questions, please call your gastroenterologist to clarify.  If you requested that your care partner not be given the details of your procedure findings, then the procedure report has been included in a sealed envelope for you to review at your convenience later.  YOU SHOULD EXPECT: Some feelings of bloating in the abdomen. Passage of more gas than usual.  Walking can help get rid of the air that was put into your GI tract during the procedure and reduce the bloating. If you had a lower endoscopy (such as a colonoscopy or flexible sigmoidoscopy) you may notice spotting of blood in your stool or on the toilet paper. If you underwent a bowel prep for your procedure, then you may not have a normal bowel movement for a few days.  DIET: Your first meal following the procedure should be a light meal and then it is ok to progress to your normal diet.  A half-sandwich or bowl of soup is an example of a good first meal.  Heavy or fried foods are harder to digest and may make you feel nauseous or bloated.  Likewise meals heavy in dairy and vegetables can cause extra gas to form and this can also increase the bloating.  Drink plenty of fluids but you should avoid alcoholic beverages for 24 hours.  ACTIVITY: Your care partner should take you home directly after the procedure.  You should plan to take it easy, moving slowly for the rest of the day.  You can resume normal activity the day after the procedure however  you should NOT DRIVE or use heavy machinery for 24 hours (because of the sedation medicines used during the test).    SYMPTOMS TO REPORT IMMEDIATELY: A gastroenterologist can be reached at any hour.  During normal business hours, 8:30 AM to 5:00 PM Monday through Friday, call [phone].  After hours and on weekends, please call the GI answering service at [phone] who will take a message and have the physician on call contact you.   Following upper endoscopy (EGD)  Vomiting of blood or coffee ground material  New chest pain or pain under the shoulder blades  Painful or persistently difficult swallowing  New shortness of breath  Fever of 100F or higher  Black, tarry-looking stools  FOLLOW UP: If any biopsies were taken you will be contacted by phone or by letter within the next 1-3 weeks.  Call your gastroenterologist if you have not heard about the biopsies in 3 weeks.  Our staff will call the home number listed on your records the next business day following your procedure to check on you and address any questions or concerns that you may have at that time regarding the information given to you following your procedure. This is a courtesy call and so if there is no answer at the home number and we have not heard from you through the emergency physician on call, we will assume that you have returned to your regular daily activities without incident.  SIGNATURES/CONFIDENTIALITY: You and/or your care partner  have signed paperwork which will be entered into your electronic medical record.  These signatures attest to the fact that that the information above on your After Visit Summary has been reviewed and is understood.  Full responsibility of the confidentiality of this discharge information lies with you and/or your care-partner.

## 2011-12-06 NOTE — Telephone Encounter (Signed)
Caller: Colleen/Patient; PCP: Ruthe Mannan (Nestor Ramp); CB#: [phone]; Call regarding; Pt missed a call back from the ofc. Per EPIC: Delilah Shan, CMA left msg. Per Dr. Jovita Gamma, may sched pt an EKG to evaluate an irr heart beat noted per her surgeon. Pls call.

## 2011-12-06 NOTE — Op Note (Signed)
[facility] [address] [place], [place]  [address]  ENDOSCOPY PROCEDURE REPORT  PATIENT:  Jennifer, Ware  MR#:  [id number] BIRTHDATE:  1971/04/02, 40 yrs. old  GENDER:  female ENDOSCOPIST:  Rachael Fee, MD Referred by:  Ruthe Mannan, M.D. PROCEDURE DATE:  12/06/2011 PROCEDURE:  EGD with biopsy, 43239 ASA CLASS:  Class II INDICATIONS:  GERD like dyspepsia, recent H. pylori serology +, treated with prevpac Abx but still with symptoms MEDICATIONS:  Fentanyl 75 mcg IV, These medications were titrated to patient response per physician's verbal order, Versed 8 mg IV TOPICAL ANESTHETIC:  Cetacaine Spray  DESCRIPTION OF PROCEDURE:   After the risks benefits and alternatives of the procedure were thoroughly explained, informed consent was obtained.  The LB GIF-H180 [id number] endoscope was introduced through the mouth and advanced to the second portion of the duodenum, without limitations.  The instrument was slowly withdrawn as the mucosa was fully examined. <<PROCEDUREIMAGES>> There was mild, non-specific gastritis. Biopsies taken and sent to pathology (jar 1) (see image3).  Otherwise the examination was normal (see image1, image2, image4, and image5).    Retroflexed views revealed no abnormalities.    The scope was then withdrawn from the patient and the procedure completed. COMPLICATIONS:  None  ENDOSCOPIC IMPRESSION: 1) Mild gastritis, biopsies taken to check for residual h. pylori 2) Otherwise normal examination  RECOMMENDATIONS: 1) Await pathology results  ______________________________ Rachael Fee, MD  n. eSIGNED:   Rachael Fee at 12/06/2011 01:44 PM  [place]ris[place], [id number]

## 2011-12-06 NOTE — Telephone Encounter (Signed)
LMOVM to return call.

## 2011-12-06 NOTE — Progress Notes (Signed)
Patient did not experience any of the following events: a burn prior to discharge; a fall within the facility; wrong site/side/patient/procedure/implant event; or a hospital transfer or hospital admission upon discharge from the facility. (G8907) Patient did not have preoperative order for IV antibiotic SSI prophylaxis. (G8918)  

## 2011-12-06 NOTE — Telephone Encounter (Signed)
Please make sure she has appt scheduled for EKG and follow up

## 2011-12-09 ENCOUNTER — Telehealth: Payer: Self-pay

## 2011-12-09 NOTE — Telephone Encounter (Signed)
  Follow up Call-  Call back number 12/06/2011  Post procedure Call Back phone  # [phone]  Permission to leave phone message Yes     Patient questions:  Do you have a fever, pain , or abdominal swelling? no Pain Score  0 *  Have you tolerated food without any problems? yes  Have you been able to return to your normal activities? yes  Do you have any questions about your discharge instructions: Diet   no Medications  no Follow up visit  no  Do you have questions or concerns about your Care? no  Actions: * If pain score is 4 or above: No action needed, pain <4.

## 2011-12-09 NOTE — Telephone Encounter (Signed)
Spoke with patient.  Appt scheduled for tomorrow morning

## 2011-12-10 ENCOUNTER — Encounter: Payer: Self-pay | Admitting: Family Medicine

## 2011-12-10 ENCOUNTER — Encounter: Payer: Self-pay | Admitting: Gastroenterology

## 2011-12-10 ENCOUNTER — Ambulatory Visit (INDEPENDENT_AMBULATORY_CARE_PROVIDER_SITE_OTHER): Payer: 59 | Admitting: Family Medicine

## 2011-12-10 VITALS — BP 120/90 | HR 68 | Temp 98.4°F | Wt 156.0 lb

## 2011-12-10 DIAGNOSIS — I499 Cardiac arrhythmia, unspecified: Secondary | ICD-10-CM

## 2011-12-10 NOTE — Progress Notes (Signed)
  Subjective:    Patient ID: Jennifer Ware, female    DOB: 15-Jan-1971, 41 y.o.   MRN: [id number]  HPI  41 yo here for ? Abnormal heart rhythm.  Was at another physician's office, Dr  Evette Cristal and was told that she had an abnormal heart rhythm. Was told to follow up with PCP.  No CP, SOB, dizziness, blurred vision. No DOE.  NO h/o cardiac arythmias.  Patient Active Problem List  Diagnoses  . Thyroid disease  . Otitis externa  . Low back pain  . Ear discomfort  . Amenorrhea  . Dyspepsia  . Abdominal pain  . Abnormal heart rhythm   Past Medical History  Diagnosis Date  . Thyroid disease   . Hx gestational diabetes   . Anxiety    Past Surgical History  Procedure Date  . Umbilical hernia repair   . Hernia repair     umbilical  . Cesarean section     x3   History  Substance Use Topics  . Smoking status: Never Smoker   . Smokeless tobacco: Never Used  . Alcohol Use: No   Family History  Problem Relation Age of Onset  . Asthma Father   . Colon cancer Neg Hx    No Known Allergies Current Outpatient Prescriptions on File Prior to Visit  Medication Sig Dispense Refill  . Cranberry 1000 MG CAPS Take 500 mg by mouth once.      . Fluocinolone Acetonide 0.01 % OIL       . Multiple Vitamin (MULTIVITAMIN) tablet Take 1 tablet by mouth daily.        . nitrofurantoin, macrocrystal-monohydrate, (MACROBID) 100 MG capsule Take as directed in office  30 capsule  5  . vitamin C (ASCORBIC ACID) 500 MG tablet Take 500 mg by mouth daily.      Review of Systems See HPI     Objective:   Physical Exam BP 120/90  Pulse 68  Temp(Src) 98.4 F (36.9 C) (Oral)  Wt 156 lb (70.761 kg)  LMP 11/11/2011  General:  Well-developed,well-nourished,in no acute distress; alert,appropriate and cooperative throughout examination Head:  normocephalic and atraumatic.   Nose:  no external deformity.   Mouth:  good dentition.   Neck:  No deformities, masses, or tenderness noted. Breasts:  No  mass, nodules, thickening, tenderness, bulging, retraction, inflamation, nipple discharge or skin changes noted.   Lungs:  Normal respiratory effort, chest expands symmetrically. Lungs are clear to auscultation, no crackles or wheezes. Heart:  Normal rate and regular rhythm. S1 and S2 normal without gallop, murmur, click, rub or other extra sounds. Psych:  Cognition and judgment appear intact. Alert and cooperative with normal attention span and concentration. No apparent delusions, illusions, hallucinations     Assessment & Plan:   1. Abnormal heart rhythm  EKG 12-Lead   Reassurance provided- NSR on exam and EKG. EKG does show occasional PACs which may be what Dr. Evette Cristal heard on exam. No further work up needed.

## 2011-12-13 ENCOUNTER — Ambulatory Visit (HOSPITAL_COMMUNITY): Payer: 59

## 2011-12-17 ENCOUNTER — Ambulatory Visit: Payer: 59 | Admitting: Obstetrics & Gynecology

## 2011-12-19 ENCOUNTER — Ambulatory Visit (HOSPITAL_COMMUNITY)
Admission: RE | Admit: 2011-12-19 | Discharge: 2011-12-19 | Disposition: A | Discharge status: Home / Self Care | Payer: 59 | Source: Ambulatory Visit | Attending: Obstetrics & Gynecology | Admitting: Obstetrics & Gynecology

## 2011-12-19 DIAGNOSIS — N941 Unspecified dyspareunia: Secondary | ICD-10-CM

## 2011-12-19 DIAGNOSIS — IMO0002 Reserved for concepts with insufficient information to code with codable children: Secondary | ICD-10-CM | POA: Insufficient documentation

## 2011-12-19 DIAGNOSIS — N949 Unspecified condition associated with female genital organs and menstrual cycle: Secondary | ICD-10-CM | POA: Insufficient documentation

## 2011-12-19 DIAGNOSIS — N938 Other specified abnormal uterine and vaginal bleeding: Secondary | ICD-10-CM | POA: Insufficient documentation

## 2011-12-19 DIAGNOSIS — D259 Leiomyoma of uterus, unspecified: Secondary | ICD-10-CM | POA: Insufficient documentation

## 2011-12-23 ENCOUNTER — Telehealth: Payer: Self-pay | Admitting: Gastroenterology

## 2011-12-23 NOTE — Telephone Encounter (Signed)
Pt was notified that a letter was mailed to her with her results.  I did read her the letter with the results.  The pt still complained of epigastric pain and burning.  She is taking omeprazole once daily.  I advised her to increase to twice daily and call back if no better in a week.  Pt agreed

## 2011-12-24 ENCOUNTER — Encounter: Payer: Self-pay | Admitting: Family Medicine

## 2011-12-24 ENCOUNTER — Ambulatory Visit (INDEPENDENT_AMBULATORY_CARE_PROVIDER_SITE_OTHER): Payer: 59 | Admitting: Family Medicine

## 2011-12-24 VITALS — BP 111/82 | HR 77 | Ht 63.0 in | Wt 157.0 lb

## 2011-12-24 DIAGNOSIS — N390 Urinary tract infection, site not specified: Secondary | ICD-10-CM | POA: Insufficient documentation

## 2011-12-24 HISTORY — DX: Urinary tract infection, site not specified: N39.0

## 2011-12-24 NOTE — Patient Instructions (Signed)

## 2011-12-24 NOTE — Progress Notes (Signed)
  Subjective:    Patient ID: Jennifer Ware, female    DOB: 21-Feb-1971, 41 y.o.   MRN: [id number]  HPI Returns for f/u.  Previously seen and treated by Dr. Macon Large for recurrent UTI following intercourse and dyspareunia.  Placed on macrobid po x 1 post intercourse.  Pelvic sonogram was negative.  Pt. Reports no abd. Pain since starting on Macrobid.  She is concerned about chronic use.   Review of Systems Neg. For fever, chills, nausea,vomiting, abd or pelvic pain.    Objective:   Physical Exam  Alert, cooperative, NAD Abd-soft, NT     Assessment & Plan:   1. Chronic UTI    Sx's improved with Macrobid prophylaxis.  Will continue for now.  Reassured about chronic use of medication which is primarily concentrated and excreted in urine.  Additionally, she is using so sporadically, should be fine.

## 2013-06-21 ENCOUNTER — Encounter: Payer: 59 | Admitting: Internal Medicine

## 2013-06-21 ENCOUNTER — Encounter: Payer: Self-pay | Admitting: Internal Medicine

## 2013-06-21 ENCOUNTER — Ambulatory Visit (INDEPENDENT_AMBULATORY_CARE_PROVIDER_SITE_OTHER): Payer: BC Managed Care – PPO | Admitting: Internal Medicine

## 2013-06-21 VITALS — BP 112/78 | HR 64 | Temp 98.1°F | Ht 63.0 in | Wt 158.5 lb

## 2013-06-21 DIAGNOSIS — M791 Myalgia, unspecified site: Secondary | ICD-10-CM

## 2013-06-21 DIAGNOSIS — IMO0001 Reserved for inherently not codable concepts without codable children: Secondary | ICD-10-CM

## 2013-06-21 DIAGNOSIS — Z0000 Encounter for general adult medical examination without abnormal findings: Secondary | ICD-10-CM

## 2013-06-21 DIAGNOSIS — R002 Palpitations: Secondary | ICD-10-CM

## 2013-06-21 MED ORDER — FLUOCINOLONE ACETONIDE 0.01 % OT OIL: 2.0000 [drp] | TOPICAL_OIL | Freq: Two times a day (BID) | OTIC | Status: DC

## 2013-06-21 MED ORDER — DICLOFENAC SODIUM 1 % TD GEL: 2.0000 g | Freq: Four times a day (QID) | TRANSDERMAL | Status: DC

## 2013-06-21 NOTE — Patient Instructions (Signed)

## 2013-06-21 NOTE — Progress Notes (Signed)
Pre-visit discussion using our clinic review tool. No additional management support is needed unless otherwise documented below in the visit note.  

## 2013-06-21 NOTE — Progress Notes (Signed)
Subjective:    Patient ID: Jennifer Ware, female    DOB: 03-22-1971, 42 y.o.   MRN: [id number]  HPI  Pt presents to the clinic today for her annual exam. She does c/o frequent anxiety attacks and heart palpitations. Her last ECG was 04/2013. She wants a referral to a cardiologist. She does not want to take anything for her anxiety because she doesn't want to be "dependant upon a pill".  Flu: 05/2013 Tetanus: 2012 LMP: 06/2013 Pap Smear: 2013 Mammogram: 2013 Eye Doctor: 2012 Dentist: 2012  Review of Systems      Past Medical History  Diagnosis Date  . Thyroid disease   . Hx gestational diabetes   . Anxiety     Current Outpatient Prescriptions  Medication Sig Dispense Refill  . BIOTIN 5000 PO Take by mouth daily.      . Calcium 600-200 MG-UNIT per tablet Take 2 tablets by mouth daily.      . Cranberry 1000 MG CAPS Take 500 mg by mouth as needed.       . Fluocinolone Acetonide 0.01 % OIL       . Multiple Vitamins-Minerals (ANTIOXIDANT PO) Take by mouth daily.      . nitrofurantoin, macrocrystal-monohydrate, (MACROBID) 100 MG capsule as needed. Take as directed in office      . vitamin C (ASCORBIC ACID) 500 MG tablet Take 500 mg by mouth daily.      . Multiple Vitamin (MULTIVITAMIN) tablet Take 1 tablet by mouth daily.         No current facility-administered medications for this visit.    No Known Allergies  Family History  Problem Relation Age of Onset  . Asthma Father   . Colon cancer Neg Hx     History   Social History  . Marital Status: Married    Spouse Name: N/A    Number of Children: 3  . Years of Education: N/A   Occupational History  . LPN    Social History Main Topics  . Smoking status: Never Smoker   . Smokeless tobacco: Never Used  . Alcohol Use: No  . Drug Use: No  . Sexual Activity: Yes    Partners: Male    Birth Control/ Protection: None   Other Topics Concern  . Not on file   Social History Narrative   Originally from [place].   Has  lived in area for 14 years.   3 children.   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       Constitutional: Denies fever, malaise, fatigue, headache or abrupt weight changes.  HEENT: Denies eye pain, eye redness, ear pain, ringing in the ears, wax buildup, runny nose, nasal congestion, bloody nose, or sore throat. Respiratory: Denies difficulty breathing, shortness of breath, cough or sputum production.   Cardiovascular: Denies chest pain, chest tightness, palpitations or swelling in the hands or feet.  Gastrointestinal: Denies abdominal pain, bloating, constipation, diarrhea or blood in the stool.  GU: Denies urgency, frequency, pain with urination, burning sensation, blood in urine, odor or discharge. Musculoskeletal: Denies decrease in range of motion, difficulty with gait, muscle pain or joint pain and swelling.  Skin: Denies redness, rashes, lesions or ulcercations.  Neurological: Denies dizziness, difficulty with memory, difficulty with speech or problems with balance and coordination.   No other specific complaints in a complete review of systems (except as listed in HPI above).  Objective:   Physical Exam  BP 112/78  Pulse 64  Temp(Src) 98.1 F (36.7  C) (Oral)  Ht 5\' 3"  (1.6 m)  Wt 158 lb 8 oz (71.895 kg)  BMI 28.08 kg/m2  LMP 06/20/2013 Wt Readings from Last 3 Encounters:  06/21/13 158 lb 8 oz (71.895 kg)  12/24/11 157 lb (71.215 kg)  12/10/11 156 lb (70.761 kg)    General: Appears her stated age, well developed, well nourished in NAD. Skin: Warm, dry and intact. No rashes, lesions or ulcerations noted. HEENT: Head: normal shape and size; Eyes: sclera white, no icterus, conjunctiva pink, PERRLA and EOMs intact; Ears: Tm's gray and intact, normal light reflex; Nose: mucosa pink and moist, septum midline; Throat/Mouth: Teeth present, mucosa pink and moist, no exudate, lesions or ulcerations noted.  Neck: Normal range of motion. Neck supple, trachea midline. No massses,  lumps or present. Thyromegaly noted. Cardiovascular: Normal rate and rhythm. S1,S2 noted.  No murmur, rubs or gallops noted. No JVD or BLE edema. No carotid bruits noted. Pulmonary/Chest: Normal effort and positive vesicular breath sounds. No respiratory distress. No wheezes, rales or ronchi noted.  Abdomen: Soft and nontender. Normal bowel sounds, no bruits noted. No distention or masses noted. Liver, spleen and kidneys non palpable. Musculoskeletal: Normal range of motion. No signs of joint swelling. No difficulty with gait.  Neurological: Alert and oriented. Cranial nerves II-XII intact. Coordination normal. +DTRs bilaterally. Psychiatric: Mood and affect normal. Behavior is normal. Judgment and thought content normal.          Assessment & Plan:   Preventative Health Maintenance:  All HM UTD Will obtain screening blood work including TSH for thyromegaly EKG- mild atrial enlargement otherwise normal Referred to cardiology per pt request although palpitations likely anxiety related  RTC in 1 year for annual exam or sooner if needed

## 2013-06-22 ENCOUNTER — Ambulatory Visit: Payer: BC Managed Care – PPO | Admitting: Cardiovascular Disease

## 2013-06-22 LAB — LIPID PANEL
Cholesterol: 159 mg/dL (ref 0–200)
HDL: 56.6 mg/dL (ref >39.00)
LDL Cholesterol: 98 mg/dL (ref 0–99)
Total CHOL/HDL Ratio: 3
Triglycerides: 23 mg/dL (ref 0.0–149.0)
VLDL: 4.6 mg/dL (ref 0.0–40.0)

## 2013-06-22 LAB — CBC
HCT: 40.2 % (ref 36.0–46.0)
Hemoglobin: 13.3 g/dL (ref 12.0–15.0)
MCHC: 33.2 g/dL (ref 30.0–36.0)
MCV: 82.7 fl (ref 78.0–100.0)
Platelets: 109 10*3/uL — ABNORMAL LOW (ref 150.0–400.0)
RBC: 4.86 Mil/uL (ref 3.87–5.11)

## 2013-06-22 LAB — COMPREHENSIVE METABOLIC PANEL
AST: 21 U/L (ref 0–37)
Alkaline Phosphatase: 67 U/L (ref 39–117)
BUN: 10 mg/dL (ref 6–23)
Creatinine, Ser: 0.8 mg/dL (ref 0.4–1.2)
Glucose, Bld: 81 mg/dL (ref 70–99)
Total Bilirubin: 1.4 mg/dL — ABNORMAL HIGH (ref 0.3–1.2)
Total Protein: 7.5 g/dL (ref 6.0–8.3)

## 2013-06-24 ENCOUNTER — Telehealth: Payer: Self-pay

## 2013-06-24 NOTE — Telephone Encounter (Signed)
Pt left v/m and is concerned about platelet level and wants to know what to do to increase platelets. Please advise.

## 2013-06-25 NOTE — Telephone Encounter (Signed)
Her levels are not low enough to be concerned. If she is taking an aspirin daily she can stop that. Other than that, we will just continue to keep an eye on her levels.

## 2013-06-28 NOTE — Telephone Encounter (Signed)
Patient notified as instructed by telephone. 

## 2013-07-12 ENCOUNTER — Telehealth: Payer: Self-pay

## 2013-07-12 NOTE — Telephone Encounter (Signed)
I cannot find it in the data base for epic- I found it in up to date-it is basically the active ingredient in ben gay and icy hot ---perhaps because of that it is not in the epic data base  Any ideas?  Thanks

## 2013-07-12 NOTE — Telephone Encounter (Signed)
Pt having back pain; voltaren gel is too expensive for pt to purchase and pt wants Dr to order analgesic balm, Methyl salicylate menthol to [facility] in [place] phone # [phone] or fax # [phone]. Pt request cb.

## 2013-07-13 NOTE — Telephone Encounter (Signed)
Not sure if it's the same but there is a few Rxs in Epic for plain Methyl salicylate with out the menthol, please advise

## 2013-07-13 NOTE — Telephone Encounter (Signed)
I saw that - if she wants to try that I will px   Otherwise I adv she get ben gay otc - that is equiv to the prior

## 2013-07-14 ENCOUNTER — Ambulatory Visit: Payer: BC Managed Care – PPO | Admitting: Cardiovascular Disease

## 2013-07-14 ENCOUNTER — Encounter: Payer: Self-pay | Admitting: Family Medicine

## 2013-07-14 ENCOUNTER — Ambulatory Visit: Payer: Self-pay | Admitting: Family Medicine

## 2013-07-14 MED ORDER — METHYL SALICYLATE 25 % EX CREA: TOPICAL_CREAM | CUTANEOUS | Status: DC

## 2013-07-14 NOTE — Telephone Encounter (Signed)
Pt notified and she does want to try the plain Methyl Salicylate, please send Rx, pt will check with pharmacy tomorrow to see when ready for pick up (doesn't need call back if you are going to write Rx)  Pt declined to try to use the OTC med she wants Rx

## 2013-07-14 NOTE — Telephone Encounter (Signed)
done

## 2013-08-10 ENCOUNTER — Encounter: Payer: 59 | Admitting: Family Medicine

## 2013-09-07 ENCOUNTER — Ambulatory Visit (INDEPENDENT_AMBULATORY_CARE_PROVIDER_SITE_OTHER): Payer: BC Managed Care – PPO | Admitting: Family Medicine

## 2013-09-07 ENCOUNTER — Encounter: Payer: Self-pay | Admitting: *Deleted

## 2013-09-07 ENCOUNTER — Encounter: Payer: Self-pay | Admitting: Family Medicine

## 2013-09-07 VITALS — BP 118/82 | HR 82 | Temp 98.5°F | Wt 161.0 lb

## 2013-09-07 DIAGNOSIS — J069 Acute upper respiratory infection, unspecified: Secondary | ICD-10-CM | POA: Insufficient documentation

## 2013-09-07 DIAGNOSIS — B9789 Other viral agents as the cause of diseases classified elsewhere: Principal | ICD-10-CM

## 2013-09-07 HISTORY — DX: Acute upper respiratory infection, unspecified: J06.9

## 2013-09-07 MED ORDER — FLUOCINOLONE ACETONIDE 0.01 % OT OIL: 2.0000 [drp] | TOPICAL_OIL | Freq: Two times a day (BID) | OTIC | Status: DC

## 2013-09-07 MED ORDER — GUAIFENESIN-CODEINE 100-10 MG/5ML PO SYRP: 5.0000 mL | ORAL_SOLUTION | Freq: Every evening | ORAL | Status: DC | PRN

## 2013-09-07 NOTE — Patient Instructions (Addendum)
Sounds like you have a viral upper respiratory infection. Antibiotics are not needed for this.  Viral infections usually take 7-10 days to resolve.  The cough can last a few weeks to go away. Use medication as prescribed: cheratussin for cough at night time. May use ibuporfen 400-600 mg at a time with food for throat pain. May also use nasal saline irrigation. Push fluids and plenty of rest. If you are not improving as expected, or if you have high fevers (>101.5) or difficulty swallowing or worsening productive cough or if prolonged symptoms past 10 days, let me know. Call clinic with questions.  Good to see you today.

## 2013-09-07 NOTE — Progress Notes (Signed)
Pre-visit discussion using our clinic review tool. No additional management support is needed unless otherwise documented below in the visit note.  

## 2013-09-07 NOTE — Assessment & Plan Note (Addendum)
Anticipate viral given short duration. cheratussin for cough at night time. See pt instructions for plan Red flags to return discussed.

## 2013-09-07 NOTE — Progress Notes (Signed)
   Subjective:    Patient ID: Jennifer Ware, female    DOB: Aug 25, 1970, 43 y.o.   MRN: [id number]  HPI CC: cough  4d h/o cough, congestion in head and chest, choking sensation on left side of neck, sore throat.  +PNdrainage.  Some L ear pain.  No fevers/chills,HA, tooth pain, nausea, abd pain.  No body aches or myalgias.  Has tried OTC cough medicines which haven't helped.  Has tried tylenol.  + sick contacts at work - works at [employer]. No smokers at home. Did get flu shot this year. No h/o asthma.  Past Medical History  Diagnosis Date  . Thyroid disease   . Hx gestational diabetes   . Anxiety   . GERD (gastroesophageal reflux disease)      Review of Systems Per HPI    Objective:   Physical Exam  Nursing note and vitals reviewed. Constitutional: She appears well-developed and well-nourished. No distress.  HENT:  Head: Normocephalic and atraumatic.  Right Ear: Hearing, external ear and ear canal normal.  Left Ear: Hearing, external ear and ear canal normal.  Nose: Mucosal edema present. No rhinorrhea. Right sinus exhibits no maxillary sinus tenderness and no frontal sinus tenderness. Left sinus exhibits no maxillary sinus tenderness and no frontal sinus tenderness.  Mouth/Throat: Uvula is midline, oropharynx is clear and moist and mucous membranes are normal. No oropharyngeal exudate, posterior oropharyngeal edema, posterior oropharyngeal erythema or tonsillar abscesses.  Mild fluid behind bilateral TMs Nasal mucosal inflammation  Eyes: Conjunctivae and EOM are normal. Pupils are equal, round, and reactive to light. No scleral icterus.  Neck: Normal range of motion. Neck supple.  Cardiovascular: Normal rate, regular rhythm, normal heart sounds and intact distal pulses.   No murmur heard. Pulmonary/Chest: Effort normal and breath sounds normal. No respiratory distress. She has no wheezes. She has no rales.  Lymphadenopathy:    She has no cervical adenopathy.  Skin: Skin is  warm and dry. No rash noted.       Assessment & Plan:

## 2013-09-07 NOTE — Addendum Note (Signed)
Addended by: Ria Bush on: 09/07/2013 04:41 PM   Modules accepted: Orders

## 2013-09-10 ENCOUNTER — Telehealth: Payer: Self-pay | Admitting: Family Medicine

## 2013-09-10 ENCOUNTER — Encounter: Payer: Self-pay | Admitting: *Deleted

## 2013-09-10 NOTE — Telephone Encounter (Signed)
Ok to write work note as requested.  If she wants to stop rx for cough syrup she can.  Has she tried OTC Delsym?  We could try rx for tussionex which is also a codeine cough syrup but a little stronger.  Let me know

## 2013-09-10 NOTE — Telephone Encounter (Signed)
Pt saw Dr. Danise Mina on 09/07/13.  She called and said that guaiFENesin-codeine syrup has not helped and that she thinks it has increased her cough.  She is still having trouble sleeping r/t her cough.    She is also working this Saturday and Sunday 7a-7p and would like a work excuse since she is not feeling better.

## 2013-09-10 NOTE — Telephone Encounter (Signed)
Spoke to pt and informed her that work note is available for her to pickup at the front desk. She states that she will d/c Rx for cough med and try delsym otc

## 2013-10-27 ENCOUNTER — Other Ambulatory Visit: Payer: Self-pay | Admitting: Obstetrics & Gynecology

## 2013-11-18 ENCOUNTER — Ambulatory Visit: Payer: Self-pay

## 2014-03-04 ENCOUNTER — Encounter: Payer: Self-pay | Admitting: Family Medicine

## 2014-03-04 ENCOUNTER — Ambulatory Visit (INDEPENDENT_AMBULATORY_CARE_PROVIDER_SITE_OTHER): Payer: 59 | Admitting: Family Medicine

## 2014-03-04 VITALS — BP 104/74 | HR 85 | Temp 98.2°F | Ht 63.0 in | Wt 155.5 lb

## 2014-03-04 DIAGNOSIS — R300 Dysuria: Secondary | ICD-10-CM

## 2014-03-04 DIAGNOSIS — N926 Irregular menstruation, unspecified: Secondary | ICD-10-CM

## 2014-03-04 DIAGNOSIS — N390 Urinary tract infection, site not specified: Secondary | ICD-10-CM

## 2014-03-04 HISTORY — DX: Irregular menstruation, unspecified: N92.6

## 2014-03-04 LAB — POCT UA - MICROSCOPIC ONLY

## 2014-03-04 LAB — POCT URINALYSIS DIPSTICK
BILIRUBIN UA: NEGATIVE
GLUCOSE UA: NEGATIVE
KETONES UA: NEGATIVE
Leukocytes, UA: NEGATIVE
Nitrite, UA: NEGATIVE
Protein, UA: NEGATIVE
Spec Grav, UA: 1.025
Urobilinogen, UA: 0.2
pH, UA: 6

## 2014-03-04 LAB — POCT URINE PREGNANCY: PREG TEST UR: NEGATIVE

## 2014-03-04 MED ORDER — FLUOCINOLONE ACETONIDE 0.01 % OT OIL: 2.0000 [drp] | TOPICAL_OIL | Freq: Two times a day (BID) | OTIC | Status: DC

## 2014-03-04 MED ORDER — CIPROFLOXACIN HCL 250 MG PO TABS: 250.0000 mg | ORAL_TABLET | Freq: Two times a day (BID) | ORAL | Status: DC

## 2014-03-04 NOTE — Assessment & Plan Note (Signed)
Pt has neutral ua dip but pos micro for rbc and wbc  Takes macrobid proph after intercourse Will give cipro 5 d course for this uti Update if no imp  Disc water intake

## 2014-03-04 NOTE — Progress Notes (Signed)
Pre visit review using our clinic review tool, if applicable. No additional management support is needed unless otherwise documented below in the visit note. 

## 2014-03-04 NOTE — Patient Instructions (Signed)
Take the cipro as directed for uti Drink lots of water  Update if not starting to improve in a week or if worsening   I sent medicines to the pharmacy  Make appt with your gyn doctor for an annual visit since your periods are irregular

## 2014-03-04 NOTE — Assessment & Plan Note (Signed)
Missed menses times one ? If perimenopausal change Neg upreg today  Disc contraception-not interested/ briefly disc sterilization as well She will f/u with her gyn

## 2014-03-04 NOTE — Progress Notes (Signed)
Subjective:    Patient ID: Jennifer Ware, female    DOB: Nov 12, 1970, 43 y.o.   MRN: [id number]  HPI Here for urinary symptoms   Started burning to urinate yesterday , some mild frequency Some itching (she usually gets this with uti)  No vaginal d/c -does not think she has yeast  No nausea or vomiting  No fever   Missed a period last month   One before that was shorter than usual (june 9th)  She does not use any birth control   Has a hx of chronic uti On macrobid - took one yesterday (she is supposed to take after intercourse or with symptoms)  Results for orders placed in visit on 03/04/14  POCT URINALYSIS DIPSTICK      Result Value Ref Range   Color, UA Amber     Clarity, UA cloudy     Glucose, UA neg.     Bilirubin, UA neg.     Ketones, UA neg.     Spec Grav, UA 1.025     Blood, UA Trace     pH, UA 6.0     Protein, UA neg.     Urobilinogen, UA 0.2     Nitrite, UA neg.     Leukocytes, UA Negative    POCT URINE PREGNANCY      Result Value Ref Range   Preg Test, Ur Negative    urine micro showed wbc and bacteria   Also needs refill for fluoicnolone oil .01% - helps her ear itching while she is here  Patient Active Problem List   Diagnosis Date Noted  . Irregular periods/menstrual cycles 03/04/2014  . Viral URI with cough 09/07/2013  . Chronic UTI 12/24/2011  . Abnormal heart rhythm 12/10/2011  . Thyroid disease    Past Medical History  Diagnosis Date  . Thyroid disease   . Hx gestational diabetes   . Anxiety   . GERD (gastroesophageal reflux disease)    Past Surgical History  Procedure Laterality Date  . Umbilical hernia repair    . Hernia repair      umbilical  . Cesarean section      x3   History  Substance Use Topics  . Smoking status: Never Smoker   . Smokeless tobacco: Never Used  . Alcohol Use: No   Family History  Problem Relation Age of Onset  . Asthma Father   . Colon cancer Neg Hx    No Known Allergies Current Outpatient  Prescriptions on File Prior to Visit  Medication Sig Dispense Refill  . BIOTIN 5000 PO Take by mouth daily.      . Calcium 600-200 MG-UNIT per tablet Take 2 tablets by mouth daily.      . Cranberry 1000 MG CAPS Take 500 mg by mouth as needed.       . diclofenac sodium (VOLTAREN) 1 % GEL Apply 2 g topically 4 (four) times daily.  1 Tube  0  . Methyl Salicylate 25 % CREA Apply to affected areas three times daily as needed  1 Tube  3  . Multiple Vitamin (MULTIVITAMIN) tablet Take 1 tablet by mouth daily.        . Multiple Vitamins-Minerals (ANTIOXIDANT PO) Take by mouth daily.      . nitrofurantoin, macrocrystal-monohydrate, (MACROBID) 100 MG capsule TAKE AS DIRECTED IN OFFICE  30 capsule  1  . vitamin C (ASCORBIC ACID) 500 MG tablet Take 500 mg by mouth daily.       No  current facility-administered medications on file prior to visit.    Review of Systems    Review of Systems  Constitutional: Negative for fever, appetite change, fatigue and unexpected weight change.  Eyes: Negative for pain and visual disturbance.  Respiratory: Negative for cough and shortness of breath.   Cardiovascular: Negative for cp or palpitations    Gastrointestinal: Negative for nausea, diarrhea and constipation.  Genitourinary: pos  for urgency and frequency. pos for dysuria/neg for hematuria or flank pain , neg for vaginal d/c Skin: Negative for pallor or rash   Neurological: Negative for weakness, light-headedness, numbness and headaches.  Hematological: Negative for adenopathy. Does not bruise/bleed easily.  Psychiatric/Behavioral: Negative for dysphoric mood. The patient is not nervous/anxious.      Objective:   Physical Exam  Constitutional: She appears well-developed and well-nourished. No distress.  overwt and well app  HENT:  Head: Normocephalic and atraumatic.  Mouth/Throat: Oropharynx is clear and moist.  Eyes: Conjunctivae and EOM are normal. Pupils are equal, round, and reactive to light.  Neck:  Normal range of motion. Neck supple.  Cardiovascular: Normal rate and regular rhythm.   Pulmonary/Chest: Effort normal and breath sounds normal.  Abdominal: Soft. Bowel sounds are normal. She exhibits no distension and no mass. There is tenderness in the suprapubic area. There is no rebound, no guarding and no CVA tenderness.  Musculoskeletal: She exhibits no edema.  Neurological: She is alert.  Skin: Skin is warm and dry. No rash noted.  Psychiatric: She has a normal mood and affect.          Assessment & Plan:   Problem List Items Addressed This Visit     Genitourinary   Chronic UTI     Pt has neutral ua dip but pos micro for rbc and wbc  Takes macrobid proph after intercourse Will give cipro 5 d course for this uti Update if no imp  Disc water intake     Relevant Orders      POCT UA - Microscopic Only (Completed)     Other   Irregular periods/menstrual cycles     Missed menses times one ? If perimenopausal change Neg upreg today  Disc contraception-not interested/ briefly disc sterilization as well She will f/u with her gyn      Other Visit Diagnoses   Dysuria    -  Primary    Relevant Orders       POCT urinalysis dipstick (Completed)       POCT UA - Microscopic Only (Completed)    Missed period        Relevant Orders       POCT urine pregnancy (Completed)

## 2014-03-08 ENCOUNTER — Telehealth: Payer: Self-pay | Admitting: Family Medicine

## 2014-03-08 MED ORDER — FLUCONAZOLE 150 MG PO TABS: 150.0000 mg | ORAL_TABLET | Freq: Once | ORAL | Status: DC

## 2014-03-08 NOTE — Telephone Encounter (Signed)
Pt.notified

## 2014-03-08 NOTE — Telephone Encounter (Signed)
Pt is in office, she said it is just vaginal itching, she thinks she does have a yeast infection. Pt has tried OTC medications for vaginal itching/yeast and it's not helping. Pt is requesting Rx sent to [facility]

## 2014-03-08 NOTE — Telephone Encounter (Signed)
Left message with family requesting pt to call office back 

## 2014-03-08 NOTE — Telephone Encounter (Signed)
Vaginal itching or all over itching ? Does she think she has a yeast infection? What med did she try otc? Thanks

## 2014-03-08 NOTE — Telephone Encounter (Signed)
Thanks - I will send a px for diflucan to her pharmacy Follow up if no improvement

## 2014-03-08 NOTE — Telephone Encounter (Signed)
Pt was seen 7/24 for uti. Pt is now experiencing itching, no burning. Pt has tried OTC medications. Please advise. Tax adviser.  [phone]

## 2014-04-12 ENCOUNTER — Ambulatory Visit: Payer: 59 | Admitting: Obstetrics & Gynecology

## 2014-04-19 ENCOUNTER — Ambulatory Visit (INDEPENDENT_AMBULATORY_CARE_PROVIDER_SITE_OTHER): Payer: 59 | Admitting: Obstetrics & Gynecology

## 2014-04-19 ENCOUNTER — Encounter: Payer: Self-pay | Admitting: Obstetrics & Gynecology

## 2014-04-19 VITALS — BP 117/89 | HR 80 | Ht 63.0 in | Wt 152.0 lb

## 2014-04-19 DIAGNOSIS — N390 Urinary tract infection, site not specified: Secondary | ICD-10-CM

## 2014-04-19 DIAGNOSIS — Z01419 Encounter for gynecological examination (general) (routine) without abnormal findings: Secondary | ICD-10-CM

## 2014-04-19 DIAGNOSIS — IMO0002 Reserved for concepts with insufficient information to code with codable children: Secondary | ICD-10-CM

## 2014-04-19 DIAGNOSIS — Z124 Encounter for screening for malignant neoplasm of cervix: Secondary | ICD-10-CM

## 2014-04-19 DIAGNOSIS — B3731 Acute candidiasis of vulva and vagina: Secondary | ICD-10-CM

## 2014-04-19 DIAGNOSIS — B373 Candidiasis of vulva and vagina: Secondary | ICD-10-CM

## 2014-04-19 DIAGNOSIS — Z113 Encounter for screening for infections with a predominantly sexual mode of transmission: Secondary | ICD-10-CM

## 2014-04-19 DIAGNOSIS — Z1151 Encounter for screening for human papillomavirus (HPV): Secondary | ICD-10-CM

## 2014-04-19 DIAGNOSIS — N926 Irregular menstruation, unspecified: Secondary | ICD-10-CM

## 2014-04-19 HISTORY — DX: Reserved for concepts with insufficient information to code with codable children: IMO0002

## 2014-04-19 MED ORDER — NITROFURANTOIN MONOHYD MACRO 100 MG PO CAPS: ORAL_CAPSULE | ORAL | Status: DC

## 2014-04-19 MED ORDER — FLUCONAZOLE 150 MG PO TABS: 150.0000 mg | ORAL_TABLET | Freq: Once | ORAL | Status: DC

## 2014-04-19 MED ORDER — MICONAZOLE NITRATE 200 MG VA SUPP: 200.0000 mg | Freq: Every day | VAGINAL | Status: DC

## 2014-04-19 NOTE — Progress Notes (Signed)
    GYNECOLOGY CLINIC ANNUAL PREVENTATIVE CARE ENCOUNTER NOTE  Subjective:     Jennifer Ware is a 43 y.o. G7P3003 female here for a routine annual gynecologic exam.  Current complaints: irregular menstrual cycles with irregular intervals between cycles.  Since 12/2013; her periods came 12/23/13, 01/18/14, 03/04/14, 03/17/14, 04/10/14 and is still having spotting from last period. Periods last about one week. No heaving bleeding, no dysmenorrhea.  Normal recent TSH analysis.  Pelvic ultrasound in 12/2011 showed 2 cm solitary myometrial leiomyoma at the right fundus.  Reports significant dyspareunia despite using lubrication, has been having it since coitarche.  Had three cesarean sections, no alleviation of pain. Pain has worsened somewhat over the years.   Also wants refill of Macrobid for UTI suppression; gets frequent UTIs after intercourse.    Also reports frequent yeast infections, wants prescribed.  Gynecologic History Patient's last menstrual period was 04/10/2014. Contraception: none Last Pap: 06/07/11. Results were: normal with negative HRHPV Last mammogram: 07/14/13. Results were: normal. Done at [facility], report scanned into EPIC.  Obstetric History OB History  Gravida Para Term Preterm AB SAB TAB Ectopic Multiple Living  3 3 3       3     # Outcome Date GA Lbr Len/2nd Weight Sex Delivery Anes PTL Lv  3 TRM 2005    M LTCS     2 TRM 2002    M LTCS     1 TRM 1999    F LTCS        The following portions of the patient's history were reviewed and updated as appropriate: allergies, current medications, past family history, past medical history, past social history, past surgical history and problem list.  Review of Systems Pertinent items are noted in HPI.    Objective:   BP 117/89  Pulse 80  Ht 5\' 3"  (1.6 m)  Wt 152 lb (68.947 kg)  BMI 26.93 kg/m2  LMP 04/10/2014 GENERAL: Well-developed, well-nourished female in no acute distress.  HEENT: Normocephalic, atraumatic. Sclerae  anicteric.  NECK: Supple. Normal thyroid.  LUNGS: Clear to auscultation bilaterally.  HEART: Regular rate and rhythm. BREASTS: Symmetric in size. No masses, skin changes, nipple drainage, or lymphadenopathy. ABDOMEN: Soft, nontender, nondistended. No organomegaly. PELVIC: Normal external female genitalia. Vagina is pink and rugated, diffuse tenderness palpation of all sides of the vagina.  Pink discharge, no active bleeding. Normal cervix contour.  Pap smear obtained. Uterus is normal in size. No adnexal mass or tenderness.  EXTREMITIES: No cyanosis, clubbing, or edema, 2+ distal pulses.   Assessment:   Annual gynecologic examination Dyspareunia Frequent UTIs after intercourse/frequent yeast infections   Plan:   Pap done, will follow up results and manage accordingly. Macrobid refilled, Miconazole and Diflucan ordered to be taken as needed. Irregular menses could be secondary to age, perimenopausal.  Normal TSH.  No menorrhagia. Will continue to observe. Declines hormonal intervention. Unclear etiology of dyspareunia, no structural anomaly seen. Patient is deciding whether she wants a pelvic ultrasound for further evaluation of her pain/irregular menses, undecided for now. Will call once she makes decision. If she desires this, it will be ordered for her.   Verita Schneiders, MD, [place] Attending [place] for [employer], [place]

## 2014-04-19 NOTE — Patient Instructions (Signed)
Preventive Care for Adults A healthy lifestyle and preventive care can promote health and wellness. Preventive health guidelines for women include the following key practices.  A routine yearly physical is a good way to check with your health care provider about your health and preventive screening. It is a chance to share any concerns and updates on your health and to receive a thorough exam.  Visit your dentist for a routine exam and preventive care every 6 months. Brush your teeth twice a day and floss once a day. Good oral hygiene prevents tooth decay and gum disease.  The frequency of eye exams is based on your age, health, family medical history, use of contact lenses, and other factors. Follow your health care provider's recommendations for frequency of eye exams.  Eat a healthy diet. Foods like vegetables, fruits, whole grains, low-fat dairy products, and lean protein foods contain the nutrients you need without too many calories. Decrease your intake of foods high in solid fats, added sugars, and salt. Eat the right amount of calories for you.Get information about a proper diet from your health care provider, if necessary.  Regular physical exercise is one of the most important things you can do for your health. Most adults should get at least 150 minutes of moderate-intensity exercise (any activity that increases your heart rate and causes you to sweat) each week. In addition, most adults need muscle-strengthening exercises on 2 or more days a week.  Maintain a healthy weight. The body mass index (BMI) is a screening tool to identify possible weight problems. It provides an estimate of body fat based on height and weight. Your health care provider can find your BMI and can help you achieve or maintain a healthy weight.For adults 20 years and older:  A BMI below 18.5 is considered underweight.  A BMI of 18.5 to 24.9 is normal.  A BMI of 25 to 29.9 is considered overweight.  A BMI of  30 and above is considered obese.  Maintain normal blood lipids and cholesterol levels by exercising and minimizing your intake of saturated fat. Eat a balanced diet with plenty of fruit and vegetables. Blood tests for lipids and cholesterol should begin at age 20 and be repeated every 5 years. If your lipid or cholesterol levels are high, you are over 50, or you are at high risk for heart disease, you may need your cholesterol levels checked more frequently.Ongoing high lipid and cholesterol levels should be treated with medicines if diet and exercise are not working.  If you smoke, find out from your health care provider how to quit. If you do not use tobacco, do not start.  Lung cancer screening is recommended for adults aged 55-80 years who are at high risk for developing lung cancer because of a history of smoking. A yearly low-dose CT scan of the lungs is recommended for people who have at least a 30-pack-year history of smoking and are a current smoker or have quit within the past 15 years. A pack year of smoking is smoking an average of 1 pack of cigarettes a day for 1 year (for example: 1 pack a day for 30 years or 2 packs a day for 15 years). Yearly screening should continue until the smoker has stopped smoking for at least 15 years. Yearly screening should be stopped for people who develop a health problem that would prevent them from having lung cancer treatment.  If you are pregnant, do not drink alcohol. If you are breastfeeding,   be very cautious about drinking alcohol. If you are not pregnant and choose to drink alcohol, do not have more than 1 drink per day. One drink is considered to be 12 ounces (355 mL) of beer, 5 ounces (148 mL) of wine, or 1.5 ounces (44 mL) of liquor.  Avoid use of street drugs. Do not share needles with anyone. Ask for help if you need support or instructions about stopping the use of drugs.  High blood pressure causes heart disease and increases the risk of  stroke. Your blood pressure should be checked at least every 1 to 2 years. Ongoing high blood pressure should be treated with medicines if weight loss and exercise do not work.  If you are 75-52 years old, ask your health care provider if you should take aspirin to prevent strokes.  Diabetes screening involves taking a blood sample to check your fasting blood sugar level. This should be done once every 3 years, after age 15, if you are within normal weight and without risk factors for diabetes. Testing should be considered at a younger age or be carried out more frequently if you are overweight and have at least 1 risk factor for diabetes.  Breast cancer screening is essential preventive care for women. You should practice "breast self-awareness." This means understanding the normal appearance and feel of your breasts and may include breast self-examination. Any changes detected, no matter how small, should be reported to a health care provider. Women in their 58s and 30s should have a clinical breast exam (CBE) by a health care provider as part of a regular health exam every 1 to 3 years. After age 16, women should have a CBE every year. Starting at age 53, women should consider having a mammogram (breast X-ray test) every year. Women who have a family history of breast cancer should talk to their health care provider about genetic screening. Women at a high risk of breast cancer should talk to their health care providers about having an MRI and a mammogram every year.  Breast cancer gene (BRCA)-related cancer risk assessment is recommended for women who have family members with BRCA-related cancers. BRCA-related cancers include breast, ovarian, tubal, and peritoneal cancers. Having family members with these cancers may be associated with an increased risk for harmful changes (mutations) in the breast cancer genes BRCA1 and BRCA2. Results of the assessment will determine the need for genetic counseling and  BRCA1 and BRCA2 testing.  Routine pelvic exams to screen for cancer are no longer recommended for nonpregnant women who are considered low risk for cancer of the pelvic organs (ovaries, uterus, and vagina) and who do not have symptoms. Ask your health care provider if a screening pelvic exam is right for you.  If you have had past treatment for cervical cancer or a condition that could lead to cancer, you need Pap tests and screening for cancer for at least 20 years after your treatment. If Pap tests have been discontinued, your risk factors (such as having a new sexual partner) need to be reassessed to determine if screening should be resumed. Some women have medical problems that increase the chance of getting cervical cancer. In these cases, your health care provider may recommend more frequent screening and Pap tests.  The HPV test is an additional test that may be used for cervical cancer screening. The HPV test looks for the virus that can cause the cell changes on the cervix. The cells collected during the Pap test can be  tested for HPV. The HPV test could be used to screen women aged 30 years and older, and should be used in women of any age who have unclear Pap test results. After the age of 30, women should have HPV testing at the same frequency as a Pap test.  Colorectal cancer can be detected and often prevented. Most routine colorectal cancer screening begins at the age of 50 years and continues through age 75 years. However, your health care provider may recommend screening at an earlier age if you have risk factors for colon cancer. On a yearly basis, your health care provider may provide home test kits to check for hidden blood in the stool. Use of a small camera at the end of a tube, to directly examine the colon (sigmoidoscopy or colonoscopy), can detect the earliest forms of colorectal cancer. Talk to your health care provider about this at age 50, when routine screening begins. Direct  exam of the colon should be repeated every 5-10 years through age 75 years, unless early forms of pre-cancerous polyps or small growths are found.  People who are at an increased risk for hepatitis B should be screened for this virus. You are considered at high risk for hepatitis B if:  You were born in a country where hepatitis B occurs often. Talk with your health care provider about which countries are considered high risk.  Your parents were born in a high-risk country and you have not received a shot to protect against hepatitis B (hepatitis B vaccine).  You have HIV or AIDS.  You use needles to inject street drugs.  You live with, or have sex with, someone who has hepatitis B.  You get hemodialysis treatment.  You take certain medicines for conditions like cancer, organ transplantation, and autoimmune conditions.  Hepatitis C blood testing is recommended for all people born from 1945 through 1965 and any individual with known risks for hepatitis C.  Practice safe sex. Use condoms and avoid high-risk sexual practices to reduce the spread of sexually transmitted infections (STIs). STIs include gonorrhea, chlamydia, syphilis, trichomonas, herpes, HPV, and human immunodeficiency virus (HIV). Herpes, HIV, and HPV are viral illnesses that have no cure. They can result in disability, cancer, and death.  You should be screened for sexually transmitted illnesses (STIs) including gonorrhea and chlamydia if:  You are sexually active and are younger than 24 years.  You are older than 24 years and your health care provider tells you that you are at risk for this type of infection.  Your sexual activity has changed since you were last screened and you are at an increased risk for chlamydia or gonorrhea. Ask your health care provider if you are at risk.  If you are at risk of being infected with HIV, it is recommended that you take a prescription medicine daily to prevent HIV infection. This is  called preexposure prophylaxis (PrEP). You are considered at risk if:  You are a heterosexual woman, are sexually active, and are at increased risk for HIV infection.  You take drugs by injection.  You are sexually active with a partner who has HIV.  Talk with your health care provider about whether you are at high risk of being infected with HIV. If you choose to begin PrEP, you should first be tested for HIV. You should then be tested every 3 months for as long as you are taking PrEP.  Osteoporosis is a disease in which the bones lose minerals and strength   with aging. This can result in serious bone fractures or breaks. The risk of osteoporosis can be identified using a bone density scan. Women ages 65 years and over and women at risk for fractures or osteoporosis should discuss screening with their health care providers. Ask your health care provider whether you should take a calcium supplement or vitamin D to reduce the rate of osteoporosis.  Menopause can be associated with physical symptoms and risks. Hormone replacement therapy is available to decrease symptoms and risks. You should talk to your health care provider about whether hormone replacement therapy is right for you.  Use sunscreen. Apply sunscreen liberally and repeatedly throughout the day. You should seek shade when your shadow is shorter than you. Protect yourself by wearing long sleeves, pants, a wide-brimmed hat, and sunglasses year round, whenever you are outdoors.  Once a month, do a whole body skin exam, using a mirror to look at the skin on your back. Tell your health care provider of new moles, moles that have irregular borders, moles that are larger than a pencil eraser, or moles that have changed in shape or color.  Stay current with required vaccines (immunizations).  Influenza vaccine. All adults should be immunized every year.  Tetanus, diphtheria, and acellular pertussis (Td, Tdap) vaccine. Pregnant women should  receive 1 dose of Tdap vaccine during each pregnancy. The dose should be obtained regardless of the length of time since the last dose. Immunization is preferred during the 27th-36th week of gestation. An adult who has not previously received Tdap or who does not know her vaccine status should receive 1 dose of Tdap. This initial dose should be followed by tetanus and diphtheria toxoids (Td) booster doses every 10 years. Adults with an unknown or incomplete history of completing a 3-dose immunization series with Td-containing vaccines should begin or complete a primary immunization series including a Tdap dose. Adults should receive a Td booster every 10 years.  Varicella vaccine. An adult without evidence of immunity to varicella should receive 2 doses or a second dose if she has previously received 1 dose. Pregnant females who do not have evidence of immunity should receive the first dose after pregnancy. This first dose should be obtained before leaving the health care facility. The second dose should be obtained 4-8 weeks after the first dose.  Human papillomavirus (HPV) vaccine. Females aged 13-26 years who have not received the vaccine previously should obtain the 3-dose series. The vaccine is not recommended for use in pregnant females. However, pregnancy testing is not needed before receiving a dose. If a female is found to be pregnant after receiving a dose, no treatment is needed. In that case, the remaining doses should be delayed until after the pregnancy. Immunization is recommended for any person with an immunocompromised condition through the age of 26 years if she did not get any or all doses earlier. During the 3-dose series, the second dose should be obtained 4-8 weeks after the first dose. The third dose should be obtained 24 weeks after the first dose and 16 weeks after the second dose.  Zoster vaccine. One dose is recommended for adults aged 60 years or older unless certain conditions are  present.  Measles, mumps, and rubella (MMR) vaccine. Adults born before 1957 generally are considered immune to measles and mumps. Adults born in 1957 or later should have 1 or more doses of MMR vaccine unless there is a contraindication to the vaccine or there is laboratory evidence of immunity to   each of the three diseases. A routine second dose of MMR vaccine should be obtained at least 28 days after the first dose for students attending postsecondary schools, health care workers, or international travelers. People who received inactivated measles vaccine or an unknown type of measles vaccine during 1963-1967 should receive 2 doses of MMR vaccine. People who received inactivated mumps vaccine or an unknown type of mumps vaccine before 1979 and are at high risk for mumps infection should consider immunization with 2 doses of MMR vaccine. For females of childbearing age, rubella immunity should be determined. If there is no evidence of immunity, females who are not pregnant should be vaccinated. If there is no evidence of immunity, females who are pregnant should delay immunization until after pregnancy. Unvaccinated health care workers born before 1957 who lack laboratory evidence of measles, mumps, or rubella immunity or laboratory confirmation of disease should consider measles and mumps immunization with 2 doses of MMR vaccine or rubella immunization with 1 dose of MMR vaccine.  Pneumococcal 13-valent conjugate (PCV13) vaccine. When indicated, a person who is uncertain of her immunization history and has no record of immunization should receive the PCV13 vaccine. An adult aged 19 years or older who has certain medical conditions and has not been previously immunized should receive 1 dose of PCV13 vaccine. This PCV13 should be followed with a dose of pneumococcal polysaccharide (PPSV23) vaccine. The PPSV23 vaccine dose should be obtained at least 8 weeks after the dose of PCV13 vaccine. An adult aged 19  years or older who has certain medical conditions and previously received 1 or more doses of PPSV23 vaccine should receive 1 dose of PCV13. The PCV13 vaccine dose should be obtained 1 or more years after the last PPSV23 vaccine dose.  Pneumococcal polysaccharide (PPSV23) vaccine. When PCV13 is also indicated, PCV13 should be obtained first. All adults aged 65 years and older should be immunized. An adult younger than age 65 years who has certain medical conditions should be immunized. Any person who resides in a nursing home or long-term care facility should be immunized. An adult smoker should be immunized. People with an immunocompromised condition and certain other conditions should receive both PCV13 and PPSV23 vaccines. People with human immunodeficiency virus (HIV) infection should be immunized as soon as possible after diagnosis. Immunization during chemotherapy or radiation therapy should be avoided. Routine use of PPSV23 vaccine is not recommended for American Indians, [place] Natives, or people younger than 65 years unless there are medical conditions that require PPSV23 vaccine. When indicated, people who have unknown immunization and have no record of immunization should receive PPSV23 vaccine. One-time revaccination 5 years after the first dose of PPSV23 is recommended for people aged 19-64 years who have chronic kidney failure, nephrotic syndrome, asplenia, or immunocompromised conditions. People who received 1-2 doses of PPSV23 before age 65 years should receive another dose of PPSV23 vaccine at age 65 years or later if at least 5 years have passed since the previous dose. Doses of PPSV23 are not needed for people immunized with PPSV23 at or after age 65 years.  Meningococcal vaccine. Adults with asplenia or persistent complement component deficiencies should receive 2 doses of quadrivalent meningococcal conjugate (MenACWY-D) vaccine. The doses should be obtained at least 2 months apart.  Microbiologists working with certain meningococcal bacteria, military recruits, people at risk during an outbreak, and people who travel to or live in countries with a high rate of meningitis should be immunized. A first-year college student up through age   21 years who is living in a residence hall should receive a dose if she did not receive a dose on or after her 16th birthday. Adults who have certain high-risk conditions should receive one or more doses of vaccine.  Hepatitis A vaccine. Adults who wish to be protected from this disease, have certain high-risk conditions, work with hepatitis A-infected animals, work in hepatitis A research labs, or travel to or work in countries with a high rate of hepatitis A should be immunized. Adults who were previously unvaccinated and who anticipate close contact with an international adoptee during the first 60 days after arrival in the [place] from a country with a high rate of hepatitis A should be immunized.  Hepatitis B vaccine. Adults who wish to be protected from this disease, have certain high-risk conditions, may be exposed to blood or other infectious body fluids, are household contacts or sex partners of hepatitis B positive people, are clients or workers in certain care facilities, or travel to or work in countries with a high rate of hepatitis B should be immunized.  Haemophilus influenzae type b (Hib) vaccine. A previously unvaccinated person with asplenia or sickle cell disease or having a scheduled splenectomy should receive 1 dose of Hib vaccine. Regardless of previous immunization, a recipient of a hematopoietic stem cell transplant should receive a 3-dose series 6-12 months after her successful transplant. Hib vaccine is not recommended for adults with HIV infection. Preventive Services / Frequency Ages 64 to 68 years  Blood pressure check.** / Every 1 to 2 years.  Lipid and cholesterol check.** / Every 5 years beginning at age  22.  Clinical breast exam.** / Every 3 years for women in their 88s and 53s.  BRCA-related cancer risk assessment.** / For women who have family members with a BRCA-related cancer (breast, ovarian, tubal, or peritoneal cancers).  Pap test.** / Every 2 years from ages 90 through 51. Every 3 years starting at age 21 through age 56 or 3 with a history of 3 consecutive normal Pap tests.  HPV screening.** / Every 3 years from ages 24 through ages 1 to 46 with a history of 3 consecutive normal Pap tests.  Hepatitis C blood test.** / For any individual with known risks for hepatitis C.  Skin self-exam. / Monthly.  Influenza vaccine. / Every year.  Tetanus, diphtheria, and acellular pertussis (Tdap, Td) vaccine.** / Consult your health care provider. Pregnant women should receive 1 dose of Tdap vaccine during each pregnancy. 1 dose of Td every 10 years.  Varicella vaccine.** / Consult your health care provider. Pregnant females who do not have evidence of immunity should receive the first dose after pregnancy.  HPV vaccine. / 3 doses over 6 months, if 72 and younger. The vaccine is not recommended for use in pregnant females. However, pregnancy testing is not needed before receiving a dose.  Measles, mumps, rubella (MMR) vaccine.** / You need at least 1 dose of MMR if you were born in 1957 or later. You may also need a 2nd dose. For females of childbearing age, rubella immunity should be determined. If there is no evidence of immunity, females who are not pregnant should be vaccinated. If there is no evidence of immunity, females who are pregnant should delay immunization until after pregnancy.  Pneumococcal 13-valent conjugate (PCV13) vaccine.** / Consult your health care provider.  Pneumococcal polysaccharide (PPSV23) vaccine.** / 1 to 2 doses if you smoke cigarettes or if you have certain conditions.  Meningococcal vaccine.** /  1 dose if you are age 19 to 21 years and a first-year college  student living in a residence hall, or have one of several medical conditions, you need to get vaccinated against meningococcal disease. You may also need additional booster doses.  Hepatitis A vaccine.** / Consult your health care provider.  Hepatitis B vaccine.** / Consult your health care provider.  Haemophilus influenzae type b (Hib) vaccine.** / Consult your health care provider. Ages 40 to 64 years  Blood pressure check.** / Every 1 to 2 years.  Lipid and cholesterol check.** / Every 5 years beginning at age 20 years.  Lung cancer screening. / Every year if you are aged 55-80 years and have a 30-pack-year history of smoking and currently smoke or have quit within the past 15 years. Yearly screening is stopped once you have quit smoking for at least 15 years or develop a health problem that would prevent you from having lung cancer treatment.  Clinical breast exam.** / Every year after age 40 years.  BRCA-related cancer risk assessment.** / For women who have family members with a BRCA-related cancer (breast, ovarian, tubal, or peritoneal cancers).  Mammogram.** / Every year beginning at age 40 years and continuing for as long as you are in good health. Consult with your health care provider.  Pap test.** / Every 3 years starting at age 30 years through age 65 or 70 years with a history of 3 consecutive normal Pap tests.  HPV screening.** / Every 3 years from ages 30 years through ages 65 to 70 years with a history of 3 consecutive normal Pap tests.  Fecal occult blood test (FOBT) of stool. / Every year beginning at age 50 years and continuing until age 75 years. You may not need to do this test if you get a colonoscopy every 10 years.  Flexible sigmoidoscopy or colonoscopy.** / Every 5 years for a flexible sigmoidoscopy or every 10 years for a colonoscopy beginning at age 50 years and continuing until age 75 years.  Hepatitis C blood test.** / For all people born from 1945 through  1965 and any individual with known risks for hepatitis C.  Skin self-exam. / Monthly.  Influenza vaccine. / Every year.  Tetanus, diphtheria, and acellular pertussis (Tdap/Td) vaccine.** / Consult your health care provider. Pregnant women should receive 1 dose of Tdap vaccine during each pregnancy. 1 dose of Td every 10 years.  Varicella vaccine.** / Consult your health care provider. Pregnant females who do not have evidence of immunity should receive the first dose after pregnancy.  Zoster vaccine.** / 1 dose for adults aged 60 years or older.  Measles, mumps, rubella (MMR) vaccine.** / You need at least 1 dose of MMR if you were born in 1957 or later. You may also need a 2nd dose. For females of childbearing age, rubella immunity should be determined. If there is no evidence of immunity, females who are not pregnant should be vaccinated. If there is no evidence of immunity, females who are pregnant should delay immunization until after pregnancy.  Pneumococcal 13-valent conjugate (PCV13) vaccine.** / Consult your health care provider.  Pneumococcal polysaccharide (PPSV23) vaccine.** / 1 to 2 doses if you smoke cigarettes or if you have certain conditions.  Meningococcal vaccine.** / Consult your health care provider.  Hepatitis A vaccine.** / Consult your health care provider.  Hepatitis B vaccine.** / Consult your health care provider.  Haemophilus influenzae type b (Hib) vaccine.** / Consult your health care provider. Ages 65   years and over  Blood pressure check.** / Every 1 to 2 years.  Lipid and cholesterol check.** / Every 5 years beginning at age 22 years.  Lung cancer screening. / Every year if you are aged 73-80 years and have a 30-pack-year history of smoking and currently smoke or have quit within the past 15 years. Yearly screening is stopped once you have quit smoking for at least 15 years or develop a health problem that would prevent you from having lung cancer  treatment.  Clinical breast exam.** / Every year after age 4 years.  BRCA-related cancer risk assessment.** / For women who have family members with a BRCA-related cancer (breast, ovarian, tubal, or peritoneal cancers).  Mammogram.** / Every year beginning at age 40 years and continuing for as long as you are in good health. Consult with your health care provider.  Pap test.** / Every 3 years starting at age 9 years through age 34 or 91 years with 3 consecutive normal Pap tests. Testing can be stopped between 65 and 70 years with 3 consecutive normal Pap tests and no abnormal Pap or HPV tests in the past 10 years.  HPV screening.** / Every 3 years from ages 57 years through ages 64 or 45 years with a history of 3 consecutive normal Pap tests. Testing can be stopped between 65 and 70 years with 3 consecutive normal Pap tests and no abnormal Pap or HPV tests in the past 10 years.  Fecal occult blood test (FOBT) of stool. / Every year beginning at age 15 years and continuing until age 17 years. You may not need to do this test if you get a colonoscopy every 10 years.  Flexible sigmoidoscopy or colonoscopy.** / Every 5 years for a flexible sigmoidoscopy or every 10 years for a colonoscopy beginning at age 86 years and continuing until age 71 years.  Hepatitis C blood test.** / For all people born from [birth year] through 1966 and any individual with known risks for hepatitis C.  Osteoporosis screening.** / A one-time screening for women ages 83 years and over and women at risk for fractures or osteoporosis.  Skin self-exam. / Monthly.  Influenza vaccine. / Every year.  Tetanus, diphtheria, and acellular pertussis (Tdap/Td) vaccine.** / 1 dose of Td every 10 years.  Varicella vaccine.** / Consult your health care provider.  Zoster vaccine.** / 1 dose for adults aged 61 years or older.  Pneumococcal 13-valent conjugate (PCV13) vaccine.** / Consult your health care provider.  Pneumococcal  polysaccharide (PPSV23) vaccine.** / 1 dose for all adults aged 28 years and older.  Meningococcal vaccine.** / Consult your health care provider.  Hepatitis A vaccine.** / Consult your health care provider.  Hepatitis B vaccine.** / Consult your health care provider.  Haemophilus influenzae type b (Hib) vaccine.** / Consult your health care provider. ** Family history and personal history of risk and conditions may change your health care provider's recommendations. Document Released: 09/24/2001 Document Revised: 12/13/2013 Document Reviewed: 12/24/2010 [facility] Patient Information 2015 [place], [place]. This information is not intended to replace advice given to you by your health care provider. Make sure you discuss any questions you have with your health care provider.

## 2014-04-21 LAB — CYTOLOGY - PAP

## 2014-04-25 ENCOUNTER — Encounter: Payer: Self-pay | Admitting: Cardiovascular Disease

## 2014-04-25 ENCOUNTER — Ambulatory Visit (INDEPENDENT_AMBULATORY_CARE_PROVIDER_SITE_OTHER): Payer: 59 | Admitting: Cardiovascular Disease

## 2014-04-25 VITALS — BP 120/84 | HR 76 | Ht 63.0 in | Wt 157.5 lb

## 2014-04-25 DIAGNOSIS — I10 Essential (primary) hypertension: Secondary | ICD-10-CM

## 2014-04-25 DIAGNOSIS — R002 Palpitations: Secondary | ICD-10-CM

## 2014-04-25 HISTORY — DX: Essential (primary) hypertension: I10

## 2014-04-25 NOTE — Progress Notes (Signed)
Jennifer Ware Date of Birth  1971/07/11       [facility] Office [address], [place], [place]  [address]   [place], [place]  [address] [address]   Fax  [phone]     Fax [phone]  Problem List: 1. Palpitations 2. Murmur  History of Present Illness:  Jennifer Ware is a 43 y.o. from [place].  Recent episodes of palptations.  Typically at night, wakes her up in the AM.   No associated Cp or dyspnea.   She has also been told that she has a heart murmur.  Tries to eat a good diet.   Exercises  Non- smoker  Rare ETOH.  3 children, ages 59, 67, 59   Current Outpatient Prescriptions on File Prior to Visit  Medication Sig Dispense Refill  . BIOTIN 5000 PO Take by mouth daily.      . Calcium 600-200 MG-UNIT per tablet Take 2 tablets by mouth daily.      . Cranberry 1000 MG CAPS Take 500 mg by mouth as needed.       . cyanocobalamin 1000 MCG tablet Take 100 mcg by mouth daily.      . fluconazole (DIFLUCAN) 150 MG tablet Take 1 tablet (150 mg total) by mouth once. Can take additional dose three days later if symptoms persist  1 tablet  3  . Ginkgo Biloba 40 MG TABS Take 1 tablet by mouth daily.      Marland Kitchen glucosamine-chondroitin 500-400 MG tablet Take 1 tablet by mouth 2 (two) times daily.      . Lecithin 1200 MG CAPS Take 1 capsule by mouth as needed.       . Methyl Salicylate 25 % CREA Apply to affected areas three times daily as needed  1 Tube  3  . miconazole (MICOTIN) 200 MG vaginal suppository Place 1 suppository (200 mg total) vaginally at bedtime. For three nights  3 suppository  4  . Multiple Vitamin (MULTIVITAMIN) tablet Take 1 tablet by mouth daily.        . Multiple Vitamins-Minerals (ANTIOXIDANT PO) Take by mouth daily.      . nitrofurantoin, macrocrystal-monohydrate, (MACROBID) 100 MG capsule Take as directed for UTI suppression  30 capsule  10  . pantoprazole (PROTONIX) 20 MG tablet  Take 20 mg by mouth daily.      Marland Kitchen VITAMIN A PO Take 1 tablet by mouth daily.       No current facility-administered medications on file prior to visit.    No Known Allergies  Past Medical History  Diagnosis Date  . Thyroid disease   . Hx gestational diabetes   . Anxiety   . GERD (gastroesophageal reflux disease)   . Heart murmur     Past Surgical History  Procedure Laterality Date  . Umbilical hernia repair    . Hernia repair      umbilical  . Cesarean section      x3    History  Smoking status  . Never Smoker   Smokeless tobacco  . Never Used    History  Alcohol Use No    Family History  Problem Relation Age of Onset  . Asthma Father   . Colon cancer Neg Hx     Reviw of Systems:  Reviewed in the HPI.  All other systems are negative.  Physical Exam: Blood pressure 118/100, pulse 76, height 5'  3" (1.6 m), weight 157 lb 8 oz (71.442 kg), last menstrual period 04/10/2014. Wt Readings from Last 3 Encounters:  04/25/14 157 lb 8 oz (71.442 kg)  04/19/14 152 lb (68.947 kg)  03/04/14 155 lb 8 oz (70.534 kg)     General: Well developed, well nourished, in no acute distress.  Head: Normocephalic, atraumatic, sclera non-icteric, mucus membranes are moist,   Neck: Supple. Carotids are 2 + without bruits. No JVD   Lungs: Clear   Heart: RR, normal S1S2  Abdomen: Soft, non-tender, non-distended with normal bowel sounds.  Msk:  Strength and tone are normal   Extremities: No clubbing or cyanosis. No edema.  Distal pedal pulses are 2+ and equal    Neuro: CN II - XII intact.  Alert and oriented X 3.   Psych:  Normal   ECG: Sept. 14, 2015:  NSR at 38, normal ECg   Assessment / Plan:

## 2014-04-25 NOTE — Patient Instructions (Signed)
Your physician wants you to follow-up in:   As needed   Your physician recommends that you continue on your current medications as directed. Please refer to the Current Medication list given to you today.

## 2014-04-25 NOTE — Assessment & Plan Note (Signed)
Jennifer Ware  presents for further evaluation of her hypertension. She has mild diastolic hypertension. She admits to eating a bit more salt than she should. She exercises but has only been exercising sporatically  recently because of her busy schedule. 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    I have encouraged her to work on a low salt diet and to exercise regularly. If her BP remains elevated I have asked her to call his back. She may need low-dose HCTZ and potassium. I've encouraged her to eat a high potassium diet.  She also complains of some palpitations that occur primarily at night. I suspect that these are premature ventricular contractions. Her TSH was normal last November. We discussed putting a [facility] monitor  her but at this point I think her palpitations are benign and do not need any further evaluation.    I will see her as needed.

## 2014-06-13 ENCOUNTER — Encounter: Payer: Self-pay | Admitting: Cardiovascular Disease

## 2014-07-27 ENCOUNTER — Ambulatory Visit (INDEPENDENT_AMBULATORY_CARE_PROVIDER_SITE_OTHER): Payer: 59 | Admitting: Family Medicine

## 2014-07-27 ENCOUNTER — Encounter: Payer: Self-pay | Admitting: Family Medicine

## 2014-07-27 ENCOUNTER — Ambulatory Visit (INDEPENDENT_AMBULATORY_CARE_PROVIDER_SITE_OTHER)
Admission: RE | Admit: 2014-07-27 | Discharge: 2014-07-27 | Disposition: A | Discharge status: Home / Self Care | Payer: 59 | Source: Ambulatory Visit | Attending: Family Medicine | Admitting: Family Medicine

## 2014-07-27 VITALS — BP 106/66 | HR 82 | Temp 98.0°F | Ht 62.75 in | Wt 155.5 lb

## 2014-07-27 DIAGNOSIS — M238X9 Other internal derangements of unspecified knee: Secondary | ICD-10-CM

## 2014-07-27 DIAGNOSIS — M2390 Unspecified internal derangement of unspecified knee: Secondary | ICD-10-CM

## 2014-07-27 DIAGNOSIS — R6889 Other general symptoms and signs: Secondary | ICD-10-CM | POA: Insufficient documentation

## 2014-07-27 DIAGNOSIS — Z0000 Encounter for general adult medical examination without abnormal findings: Secondary | ICD-10-CM

## 2014-07-27 DIAGNOSIS — Z1239 Encounter for other screening for malignant neoplasm of breast: Secondary | ICD-10-CM

## 2014-07-27 DIAGNOSIS — I10 Essential (primary) hypertension: Secondary | ICD-10-CM

## 2014-07-27 DIAGNOSIS — R002 Palpitations: Secondary | ICD-10-CM

## 2014-07-27 HISTORY — DX: Other internal derangements of unspecified knee: M23.8X9

## 2014-07-27 HISTORY — DX: Other general symptoms and signs: R68.89

## 2014-07-27 LAB — COMPREHENSIVE METABOLIC PANEL
ALBUMIN: 3.7 g/dL (ref 3.5–5.2)
ALT: 17 U/L (ref 0–35)
AST: 18 U/L (ref 0–37)
Alkaline Phosphatase: 67 U/L (ref 39–117)
BUN: 15 mg/dL (ref 6–23)
CALCIUM: 8.9 mg/dL (ref 8.4–10.5)
CHLORIDE: 105 meq/L (ref 96–112)
CO2: 25 meq/L (ref 19–32)
Creatinine, Ser: 0.8 mg/dL (ref 0.4–1.2)
GFR: 95.9 mL/min (ref >60.00)
Glucose, Bld: 152 mg/dL — ABNORMAL HIGH (ref 70–99)
POTASSIUM: 3.6 meq/L (ref 3.5–5.1)
Sodium: 135 mEq/L (ref 135–145)
Total Bilirubin: 0.8 mg/dL (ref 0.2–1.2)
Total Protein: 7 g/dL (ref 6.0–8.3)

## 2014-07-27 LAB — CBC WITH DIFFERENTIAL/PLATELET
BASOS ABS: 0 10*3/uL (ref 0.0–0.1)
BASOS PCT: 0.4 % (ref 0.0–3.0)
Eosinophils Absolute: 0.1 10*3/uL (ref 0.0–0.7)
Eosinophils Relative: 2.1 % (ref 0.0–5.0)
HCT: 38.1 % (ref 36.0–46.0)
HEMOGLOBIN: 12.5 g/dL (ref 12.0–15.0)
LYMPHS PCT: 48 % — AB (ref 12.0–46.0)
Lymphs Abs: 2.4 10*3/uL (ref 0.7–4.0)
MCHC: 32.8 g/dL (ref 30.0–36.0)
MCV: 80.6 fl (ref 78.0–100.0)
Monocytes Absolute: 0.4 10*3/uL (ref 0.1–1.0)
Monocytes Relative: 8.4 % (ref 3.0–12.0)
NEUTROS PCT: 41.1 % — AB (ref 43.0–77.0)
Neutro Abs: 2.1 10*3/uL (ref 1.4–7.7)
Platelets: 145 10*3/uL — ABNORMAL LOW (ref 150.0–400.0)
RBC: 4.73 Mil/uL (ref 3.87–5.11)
RDW: 14.7 % (ref 11.5–15.5)
WBC: 5 10*3/uL (ref 4.0–10.5)

## 2014-07-27 LAB — LIPID PANEL
CHOL/HDL RATIO: 3
Cholesterol: 164 mg/dL (ref 0–200)
HDL: 51.4 mg/dL (ref >39.00)
LDL Cholesterol: 106 mg/dL — ABNORMAL HIGH (ref 0–99)
NONHDL: 112.6
Triglycerides: 35 mg/dL (ref 0.0–149.0)
VLDL: 7 mg/dL (ref 0.0–40.0)

## 2014-07-27 LAB — T4, FREE: FREE T4: 0.93 ng/dL (ref 0.60–1.60)

## 2014-07-27 LAB — VITAMIN B12

## 2014-07-27 LAB — TSH: TSH: 1.23 u[IU]/mL (ref 0.35–4.50)

## 2014-07-27 MED ORDER — FLUOCINOLONE ACETONIDE 0.01 % OT OIL: TOPICAL_OIL | OTIC | Status: DC

## 2014-07-27 NOTE — Assessment & Plan Note (Signed)
Well controlled on current rx. No changes made. 

## 2014-07-27 NOTE — Assessment & Plan Note (Signed)
Reviewed preventive care protocols, scheduled due services, and updated immunizations Discussed nutrition, exercise, diet, and healthy lifestyle.  Orders Placed This Encounter  Procedures  . MM Digital Screening  . DG Knee Complete 4 Views Right  . CBC with Differential  . Comprehensive metabolic panel  . Lipid panel  . TSH  . T4, Free  . Vitamin B12

## 2014-07-27 NOTE — Assessment & Plan Note (Signed)
New- check labs today- CBC, thyroid function, B12 for initial work up. The patient indicates understanding of these issues and agrees with the plan.

## 2014-07-27 NOTE — Patient Instructions (Addendum)
Great to see you. Happy holidays.  Please call to schedule your mammogram.  We will call you with your results and you can view them online.

## 2014-07-27 NOTE — Assessment & Plan Note (Signed)
Reassured by recent cardiology evaluation. She is considering heart monitor but will call Dr. Acie Fredrickson directly if she decides to proceed with this.

## 2014-07-27 NOTE — Progress Notes (Signed)
Pre visit review using our clinic review tool, if applicable. No additional management support is needed unless otherwise documented below in the visit note. 

## 2014-07-27 NOTE — Progress Notes (Signed)
Subjective:   Patient ID: Jennifer Ware, female    DOB: 10/30/1970, 43 y.o.   MRN: [id number]  Jennifer Ware is a pleasant 43 y.o. year old female who presents to clinic today with Annual Exam  on 07/27/2014  HPI:   Doing well.  3 kids are doing well.  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  Pap smear 04/22/14- Dr. 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 Acie Fredrickson on 04/25/2014- note reviewed. Felt they were benign PVCs but did suggest [facility] monitor if symptoms persisted.  Feeling cold- has been ongoing for past several years.  Notices that she is usually the coldest person in the room. Denies any other symptoms of hypo or hyperthyroidism other than thinning hair.  Knee crepitus right > left, has noticed for past 2 years.  Has started to hurt as well after she has been standing for a long time. No swelling, no redness or warmth, no pain with palpitations. Has tried anything for it other than taking chondroitin which has been helping for past year. Lab Results  Component Value Date   CHOL 159 06/21/2013   HDL 56.60 06/21/2013   LDLCALC 98 06/21/2013   TRIG 23.0 06/21/2013   CHOLHDL 3 06/21/2013   Lab Results  Component Value Date   CREATININE 0.8 06/21/2013   Lab Results  Component Value Date   TSH 0.68 06/21/2013   Lab Results  Component Value Date   NA 137 06/21/2013   K 3.7 06/21/2013   CL 103 06/21/2013   CO2 29 06/21/2013   Lab Results  Component Value Date   WBC 5.6 06/21/2013   HGB 13.3 06/21/2013   HCT 40.2 06/21/2013   MCV 82.7 06/21/2013   PLT 109.0* 06/21/2013   Current Outpatient Prescriptions on File Prior to Visit  Medication Sig Dispense Refill  . Ascorbic Acid (VITAMIN C) 1000 MG tablet Take 1,000 mg by mouth daily.    Marland Kitchen BIOTIN 5000 PO Take by mouth daily.    . Calcium 600-200 MG-UNIT per tablet Take 2 tablets by mouth daily.    . Cranberry 1000 MG CAPS  Take 500 mg by mouth as needed.     . cyanocobalamin 1000 MCG tablet Take 100 mcg by mouth daily.    . Ginkgo Biloba 40 MG TABS Take 1 tablet by mouth daily.    Marland Kitchen glucosamine-chondroitin 500-400 MG tablet Take 1 tablet by mouth 2 (two) times daily.    . Lecithin 1200 MG CAPS Take 1 capsule by mouth as needed.     . Methyl Salicylate 25 % CREA Apply to affected areas three times daily as needed 1 Tube 3  . miconazole (MICOTIN) 200 MG vaginal suppository Place 1 suppository (200 mg total) vaginally at bedtime. For three nights 3 suppository 4  . Multiple Vitamin (MULTIVITAMIN) tablet Take 1 tablet by mouth daily.      . Multiple Vitamins-Minerals (ANTIOXIDANT PO) Take by mouth daily.    . nitrofurantoin, macrocrystal-monohydrate, (MACROBID) 100 MG capsule Take as directed for UTI suppression 30 capsule 10  . pantoprazole (PROTONIX) 20 MG tablet Take 20 mg by mouth daily.    Marland Kitchen VITAMIN A PO Take 1 tablet by mouth daily.     No current facility-administered medications on file prior to visit.    No Known Allergies  Past Medical History  Diagnosis Date  . Thyroid disease   . Hx gestational diabetes   . Anxiety   .  GERD (gastroesophageal reflux disease)   . Heart murmur     Past Surgical History  Procedure Laterality Date  . Umbilical hernia repair    . Hernia repair      umbilical  . Cesarean section      x3    Family History  Problem Relation Age of Onset  . Asthma Father   . Colon cancer Neg Hx     History   Social History  . Marital Status: Married    Spouse Name: N/A    Number of Children: 3  . Years of Education: N/A   Occupational History  . LPN    Social History Main Topics  . Smoking status: Never Smoker   . Smokeless tobacco: Never Used  . Alcohol Use: No  . Drug Use: No  . Sexual Activity:    Partners: Male    Birth Control/ Protection: None   Other Topics Concern  . Not on file   Social History Narrative   Originally from [place].   Has lived in  area for 14 years.   3 children.   Works as a Marine scientist for [employer].     The PMH, PSH, Social History, Family History, Medications, and allergies have been reviewed in [facility], and have been updated if relevant.   Review of Systems  Constitutional: Positive for fatigue. Negative for fever, chills, appetite change and unexpected weight change.  HENT: Negative.   Eyes: Negative.   Respiratory: Negative.   Cardiovascular: Positive for palpitations. Negative for chest pain and leg swelling.  Gastrointestinal: Negative.   Endocrine: Positive for cold intolerance. Negative for heat intolerance, polydipsia, polyphagia and polyuria.  Genitourinary: Negative.   Musculoskeletal: Negative for myalgias, neck pain and neck stiffness.  Skin: Negative.   Neurological: Negative.   Hematological: Negative.   Psychiatric/Behavioral: Negative.   All other systems reviewed and are negative.      Objective:    BP 106/66 mmHg  Pulse 82  Temp(Src) 98 F (36.7 C) (Oral)  Ht 5' 2.75" (1.594 m)  Wt 155 lb 8 oz (70.534 kg)  BMI 27.76 kg/m2  SpO2 98%  LMP 07/19/2014 Wt Readings from Last 3 Encounters:  07/27/14 155 lb 8 oz (70.534 kg)  04/25/14 157 lb 8 oz (71.442 kg)  04/19/14 152 lb (68.947 kg)     Physical Exam  Constitutional: She is oriented to person, place, and time. She appears well-developed and well-nourished. No distress.  HENT:  Head: Normocephalic and atraumatic.  Eyes: Pupils are equal, round, and reactive to light.  Neck: Normal range of motion. Neck supple. No thyromegaly present.  Cardiovascular: Normal rate, regular rhythm and normal heart sounds.   Pulmonary/Chest: Effort normal and breath sounds normal. No respiratory distress. She has no wheezes. She has no rales. She exhibits no tenderness.  Abdominal: Soft.  Musculoskeletal:       Right knee: She exhibits normal range of motion, no swelling, no effusion, no ecchymosis, no deformity, no laceration, no erythema, normal alignment,  normal patellar mobility, normal meniscus and no MCL laxity. No tenderness found. No medial joint line tenderness noted.       Left knee: Normal.  +crepitus bilateral knees  Neurological: She is alert and oriented to person, place, and time. No cranial nerve deficit. Coordination normal.  Skin: Skin is warm and dry.  Psychiatric: She has a normal mood and affect. Her behavior is normal. Judgment and thought content normal.  Nursing note and vitals reviewed.  Assessment & Plan:   Well woman exam (no gynecological exam) - Plan: CBC with Differential, Comprehensive metabolic panel, Lipid panel  Essential hypertension  Screening for breast cancer - Plan: MM Digital Screening  Sensation of feeling cold - Plan: CBC with Differential, TSH, T4, Free, Vitamin B12  Knee crepitus, unspecified laterality  Palpitations No Follow-up on file.

## 2014-07-27 NOTE — Addendum Note (Signed)
Addended by: Marchia Bond on: 07/27/2014 04:25 PM   Modules accepted: Miquel Dunn

## 2014-07-27 NOTE — Assessment & Plan Note (Signed)
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 Will xray one knee (right worse), for further evaluation. May benefit from injections. The patient indicates understanding of these issues and agrees with the plan.

## 2014-08-03 ENCOUNTER — Ambulatory Visit: Payer: Self-pay | Admitting: Family Medicine

## 2014-08-03 ENCOUNTER — Telehealth: Payer: Self-pay | Admitting: Family Medicine

## 2014-08-03 DIAGNOSIS — M238X9 Other internal derangements of unspecified knee: Secondary | ICD-10-CM

## 2014-08-03 NOTE — Telephone Encounter (Signed)
Pt is requesting a referral to Ortho for her knees

## 2014-08-03 NOTE — Telephone Encounter (Signed)
Referral placed.

## 2014-08-04 ENCOUNTER — Encounter: Payer: Self-pay | Admitting: Family Medicine

## 2014-08-08 ENCOUNTER — Telehealth: Payer: Self-pay | Admitting: Family Medicine

## 2014-08-08 ENCOUNTER — Other Ambulatory Visit: Payer: Self-pay | Admitting: Internal Medicine

## 2014-08-08 DIAGNOSIS — R928 Other abnormal and inconclusive findings on diagnostic imaging of breast: Secondary | ICD-10-CM

## 2014-08-08 NOTE — Telephone Encounter (Signed)
Pt called requesting mammogram results.  I see the order in her chart but no results.  Please call pt once results are available.

## 2014-08-09 NOTE — Telephone Encounter (Signed)
Pt will be contacted upon receiving results

## 2014-08-15 ENCOUNTER — Encounter: Payer: Self-pay | Admitting: Family Medicine

## 2014-08-15 ENCOUNTER — Ambulatory Visit: Payer: Self-pay | Admitting: Family Medicine

## 2014-08-16 ENCOUNTER — Telehealth: Payer: Self-pay | Admitting: Family Medicine

## 2014-08-16 DIAGNOSIS — N390 Urinary tract infection, site not specified: Secondary | ICD-10-CM

## 2014-08-16 NOTE — Telephone Encounter (Signed)
Patient returned your call.  Call her back at [phone] or [phone].

## 2014-08-16 NOTE — Telephone Encounter (Signed)
Pt called requesting referral to urology.

## 2014-08-16 NOTE — Telephone Encounter (Signed)
Lm on pts vm requesting a call back 

## 2014-08-16 NOTE — Telephone Encounter (Signed)
Referral placed.

## 2014-08-16 NOTE — Telephone Encounter (Signed)
For what symptoms?

## 2014-08-16 NOTE — Telephone Encounter (Signed)
Spoke to pt who states that she is having dysuria and has Hx of chronic UTI. Had previous appt with [facility] 02/2014. Advised pt I would contact her to inform her if OV is first required due to amount of time since last visit. Pt advised referral may be delayed due to volume, if placed.

## 2014-08-24 ENCOUNTER — Ambulatory Visit: Payer: Self-pay | Admitting: Family Medicine

## 2014-08-24 HISTORY — PX: BREAST BIOPSY: SHX20

## 2014-08-25 ENCOUNTER — Encounter: Payer: Self-pay | Admitting: Family Medicine

## 2014-08-31 ENCOUNTER — Encounter: Payer: Self-pay | Admitting: Family Medicine

## 2014-09-06 ENCOUNTER — Other Ambulatory Visit: Payer: Self-pay | Admitting: Family Medicine

## 2014-09-06 DIAGNOSIS — R599 Enlarged lymph nodes, unspecified: Secondary | ICD-10-CM

## 2014-11-25 ENCOUNTER — Other Ambulatory Visit: Payer: Self-pay | Admitting: Family Medicine

## 2014-11-25 DIAGNOSIS — R599 Enlarged lymph nodes, unspecified: Secondary | ICD-10-CM

## 2014-12-05 LAB — SURGICAL PATHOLOGY

## 2014-12-27 ENCOUNTER — Ambulatory Visit
Admission: RE | Admit: 2014-12-27 | Discharge: 2014-12-27 | Disposition: A | Discharge status: Home / Self Care | Payer: 59 | Source: Ambulatory Visit | Attending: Ophthalmology | Admitting: Ophthalmology

## 2014-12-27 ENCOUNTER — Other Ambulatory Visit: Payer: Self-pay | Admitting: Ophthalmology

## 2014-12-27 DIAGNOSIS — R7611 Nonspecific reaction to tuberculin skin test without active tuberculosis: Secondary | ICD-10-CM

## 2015-01-20 ENCOUNTER — Ambulatory Visit (INDEPENDENT_AMBULATORY_CARE_PROVIDER_SITE_OTHER): Payer: 59 | Admitting: Internal Medicine

## 2015-01-20 ENCOUNTER — Encounter: Payer: Self-pay | Admitting: Internal Medicine

## 2015-01-20 VITALS — BP 112/78 | HR 81 | Temp 98.2°F | Wt 158.0 lb

## 2015-01-20 DIAGNOSIS — J309 Allergic rhinitis, unspecified: Secondary | ICD-10-CM | POA: Diagnosis not present

## 2015-01-20 LAB — CBC
HCT: 37.8 % (ref 36.0–46.0)
Hemoglobin: 12.3 g/dL (ref 12.0–15.0)
MCH: 27.2 pg (ref 26.0–34.0)
MCHC: 32.5 g/dL (ref 30.0–36.0)
MCV: 83.4 fL (ref 78.0–100.0)
MPV: 11 fL (ref 8.6–12.4)
Platelets: 160 10*3/uL (ref 150–400)
RBC: 4.53 MIL/uL (ref 3.87–5.11)
RDW: 14.6 % (ref 11.5–15.5)
WBC: 5.6 10*3/uL (ref 4.0–10.5)

## 2015-01-20 NOTE — Patient Instructions (Addendum)
Take Zyrtec once a day at night Take Flonase once a day in the morning  Allergic Rhinitis Allergic rhinitis is when the mucous membranes in the nose respond to allergens. Allergens are particles in the air that cause your body to have an allergic reaction. This causes you to release allergic antibodies. Through a chain of events, these eventually cause you to release histamine into the blood stream. Although meant to protect the body, it is this release of histamine that causes your discomfort, such as frequent sneezing, congestion, and an itchy, runny nose.  CAUSES  Seasonal allergic rhinitis (hay fever) is caused by pollen allergens that may come from grasses, trees, and weeds. Year-round allergic rhinitis (perennial allergic rhinitis) is caused by allergens such as house dust mites, pet dander, and mold spores.  SYMPTOMS   Nasal stuffiness (congestion).  Itchy, runny nose with sneezing and tearing of the eyes. DIAGNOSIS  Your health care provider can help you determine the allergen or allergens that trigger your symptoms. If you and your health care provider are unable to determine the allergen, skin or blood testing may be used. TREATMENT  Allergic rhinitis does not have a cure, but it can be controlled by:  Medicines and allergy shots (immunotherapy).  Avoiding the allergen. Hay fever may often be treated with antihistamines in pill or nasal spray forms. Antihistamines block the effects of histamine. There are over-the-counter medicines that may help with nasal congestion and swelling around the eyes. Check with your health care provider before taking or giving this medicine.  If avoiding the allergen or the medicine prescribed do not work, there are many new medicines your health care provider can prescribe. Stronger medicine may be used if initial measures are ineffective. Desensitizing injections can be used if medicine and avoidance does not work. Desensitization is when a patient is  given ongoing shots until the body becomes less sensitive to the allergen. Make sure you follow up with your health care provider if problems continue. HOME CARE INSTRUCTIONS It is not possible to completely avoid allergens, but you can reduce your symptoms by taking steps to limit your exposure to them. It helps to know exactly what you are allergic to so that you can avoid your specific triggers. SEEK MEDICAL CARE IF:   You have a fever.  You develop a cough that does not stop easily (persistent).  You have shortness of breath.  You start wheezing.  Symptoms interfere with normal daily activities. Document Released: 04/23/2001 Document Revised: 08/03/2013 Document Reviewed: 04/05/2013 [facility] Patient Information 2015 [place], [place]. This information is not intended to replace advice given to you by your health care provider. Make sure you discuss any questions you have with your health care provider.

## 2015-01-20 NOTE — Progress Notes (Signed)
Pre visit review using our clinic review tool, if applicable. No additional management support is needed unless otherwise documented below in the visit note. 

## 2015-01-20 NOTE — Progress Notes (Signed)
HPI  Pt presents to the clinic today with c/o nasal congestion, scratchy throat and cough. This started 1 week ago. She is blowing clear mucous out of her nose. The cough is productive at time with thick yellow mucous. She denies shortness of breath. She denies fever, chills or body aches. He has tried Nyquil OTC. She has no history of seasonal allergies. She has not had sick contacts. She does not smoke.  Review of Systems    Past Medical History  Diagnosis Date  . Thyroid disease   . Hx gestational diabetes   . Anxiety   . GERD (gastroesophageal reflux disease)   . Heart murmur     Family History  Problem Relation Age of Onset  . Asthma Father   . Colon cancer Neg Hx     History   Social History  . Marital Status: Married    Spouse Name: N/A  . Number of Children: 3  . Years of Education: N/A   Occupational History  . LPN    Social History Main Topics  . Smoking status: Never Smoker   . Smokeless tobacco: Never Used  . Alcohol Use: No  . Drug Use: No  . Sexual Activity:    Partners: Male    Birth Control/ Protection: None   Other Topics Concern  . Not on file   Social History Narrative   Originally from [place].   Has lived in area for 14 years.   3 children.   Works as a Marine scientist for [employer].      No Known Allergies   Constitutional: Positive headache, fatigue. Denies fever or abrupt weight changes.  HEENT:  Positive nasal congestion and sore throat. Denies eye redness, ear pain, ringing in the ears, wax buildup, runny nose or bloody nose. Respiratory: Positive cough. Denies difficulty breathing or shortness of breath.  Cardiovascular: Denies chest pain, chest tightness, palpitations or swelling in the hands or feet.   No other specific complaints in a complete review of systems (except as listed in HPI above).  Objective:  BP 112/78 mmHg  Pulse 81  Temp(Src) 98.2 F (36.8 C) (Oral)  Wt 158 lb (71.668 kg)  SpO2 98%  LMP  (Approximate)   General:  Appears her stated age, well developed, well nourished in NAD. HEENT: Head: normal shape and size, no sinus tenderness noted; Eyes: sclera white, no icterus, conjunctiva pink; Ears: Tm's gray and intact, normal light reflex; Nose: mucosa boggy and moist, septum midline; Throat/Mouth: + PND. Teeth present, mucosa pink and moist, no exudate noted, no lesions or ulcerations noted.  Neck: No adenopathy. Cardiovascular: Normal rate and rhythm. S1,S2 noted.  Pulmonary/Chest: Normal effort and positive vesicular breath sounds. No respiratory distress. No wheezes, rales or ronchi noted.      Assessment & Plan:   Allergic Rhinitis:  Can use a Neti Pot which can be purchased from your local drug store. Flonase 2 sprays each nostril for 3 days and then as needed. Augmentin BID for 10 days Zyrtec nightly x 1 week CBC today  RTC as needed or if symptoms persist.

## 2015-02-23 ENCOUNTER — Other Ambulatory Visit: Payer: 59

## 2015-05-08 ENCOUNTER — Ambulatory Visit
Admission: RE | Admit: 2015-05-08 | Discharge: 2015-05-08 | Disposition: A | Discharge status: Home / Self Care | Payer: 59 | Source: Ambulatory Visit | Attending: Family Medicine | Admitting: Family Medicine

## 2015-05-08 ENCOUNTER — Ambulatory Visit: Payer: 59

## 2015-05-08 ENCOUNTER — Other Ambulatory Visit: Payer: 59

## 2015-05-08 ENCOUNTER — Other Ambulatory Visit: Payer: Self-pay | Admitting: Family Medicine

## 2015-05-08 DIAGNOSIS — R599 Enlarged lymph nodes, unspecified: Secondary | ICD-10-CM

## 2015-05-08 DIAGNOSIS — R591 Generalized enlarged lymph nodes: Secondary | ICD-10-CM | POA: Diagnosis not present

## 2015-07-31 ENCOUNTER — Ambulatory Visit (INDEPENDENT_AMBULATORY_CARE_PROVIDER_SITE_OTHER): Payer: 59 | Admitting: Family Medicine

## 2015-07-31 ENCOUNTER — Other Ambulatory Visit (HOSPITAL_COMMUNITY)
Admission: RE | Admit: 2015-07-31 | Discharge: 2015-07-31 | Disposition: A | Discharge status: Home / Self Care | Payer: 59 | Source: Ambulatory Visit | Attending: Family Medicine | Admitting: Family Medicine

## 2015-07-31 ENCOUNTER — Other Ambulatory Visit: Payer: Self-pay | Admitting: Family Medicine

## 2015-07-31 ENCOUNTER — Encounter: Payer: Self-pay | Admitting: Family Medicine

## 2015-07-31 VITALS — BP 120/74 | HR 76 | Temp 97.7°F | Ht 62.0 in | Wt 143.8 lb

## 2015-07-31 DIAGNOSIS — Z113 Encounter for screening for infections with a predominantly sexual mode of transmission: Secondary | ICD-10-CM | POA: Insufficient documentation

## 2015-07-31 DIAGNOSIS — E079 Disorder of thyroid, unspecified: Secondary | ICD-10-CM | POA: Diagnosis not present

## 2015-07-31 DIAGNOSIS — N760 Acute vaginitis: Secondary | ICD-10-CM | POA: Diagnosis present

## 2015-07-31 DIAGNOSIS — I10 Essential (primary) hypertension: Secondary | ICD-10-CM | POA: Diagnosis not present

## 2015-07-31 DIAGNOSIS — Z1151 Encounter for screening for human papillomavirus (HPV): Secondary | ICD-10-CM | POA: Insufficient documentation

## 2015-07-31 DIAGNOSIS — Z01419 Encounter for gynecological examination (general) (routine) without abnormal findings: Secondary | ICD-10-CM | POA: Insufficient documentation

## 2015-07-31 DIAGNOSIS — B373 Candidiasis of vulva and vagina: Secondary | ICD-10-CM

## 2015-07-31 DIAGNOSIS — Z0000 Encounter for general adult medical examination without abnormal findings: Secondary | ICD-10-CM | POA: Diagnosis not present

## 2015-07-31 DIAGNOSIS — B3731 Acute candidiasis of vulva and vagina: Secondary | ICD-10-CM

## 2015-07-31 LAB — CBC WITH DIFFERENTIAL/PLATELET
BASOS ABS: 0 10*3/uL (ref 0.0–0.1)
Basophils Relative: 0.3 % (ref 0.0–3.0)
Eosinophils Absolute: 0.1 10*3/uL (ref 0.0–0.7)
Eosinophils Relative: 0.9 % (ref 0.0–5.0)
HCT: 41.4 % (ref 36.0–46.0)
HEMOGLOBIN: 13.6 g/dL (ref 12.0–15.0)
LYMPHS ABS: 2 10*3/uL (ref 0.7–4.0)
Lymphocytes Relative: 31.3 % (ref 12.0–46.0)
MCHC: 32.8 g/dL (ref 30.0–36.0)
MCV: 86.6 fl (ref 78.0–100.0)
MONO ABS: 0.4 10*3/uL (ref 0.1–1.0)
MONOS PCT: 6.5 % (ref 3.0–12.0)
NEUTROS PCT: 61 % (ref 43.0–77.0)
Neutro Abs: 3.9 10*3/uL (ref 1.4–7.7)
Platelets: 148 10*3/uL — ABNORMAL LOW (ref 150.0–400.0)
RBC: 4.79 Mil/uL (ref 3.87–5.11)
RDW: 14.4 % (ref 11.5–15.5)
WBC: 6.4 10*3/uL (ref 4.0–10.5)

## 2015-07-31 LAB — LIPID PANEL
Cholesterol: 161 mg/dL (ref 0–200)
HDL: 59.7 mg/dL (ref >39.00)
LDL Cholesterol: 93 mg/dL (ref 0–99)
NONHDL: 100.91
Total CHOL/HDL Ratio: 3
Triglycerides: 39 mg/dL (ref 0.0–149.0)
VLDL: 7.8 mg/dL (ref 0.0–40.0)

## 2015-07-31 LAB — HEMOGLOBIN A1C: Hgb A1c MFr Bld: 5.2 % (ref 4.6–6.5)

## 2015-07-31 LAB — VITAMIN D 25 HYDROXY (VIT D DEFICIENCY, FRACTURES): VITD: 79.86 ng/mL (ref 30.00–100.00)

## 2015-07-31 LAB — TSH: TSH: 1.25 u[IU]/mL (ref 0.35–4.50)

## 2015-07-31 MED ORDER — MICONAZOLE NITRATE 200 MG VA SUPP: 200.0000 mg | Freq: Every day | VAGINAL | Status: DC

## 2015-07-31 MED ORDER — FLUOCINOLONE ACETONIDE 0.01 % OT OIL: TOPICAL_OIL | OTIC | Status: DC

## 2015-07-31 NOTE — Patient Instructions (Signed)
Great to see you. Happy Holidays.  We will call you with your results and you can view them online. 

## 2015-07-31 NOTE — Assessment & Plan Note (Signed)
Reviewed preventive care protocols, scheduled due services, and updated immunizations Discussed nutrition, exercise, diet, and healthy lifestyle.  Discussed USPSTF recommendations of cervical cancer screening.  She is aware that interval of 3 years is recommended but pt would prefer to have pap smear done today.  Orders Placed This Encounter  Procedures  . CBC with Differential/Platelet  . Comprehensive metabolic panel  . Lipid panel  . TSH  . Vitamin D, 25-hydroxy  . HIV antibody (with reflex)

## 2015-07-31 NOTE — Progress Notes (Signed)
Pre visit review using our clinic review tool, if applicable. No additional management support is needed unless otherwise documented below in the visit note. 

## 2015-07-31 NOTE — Addendum Note (Signed)
Addended by: Modena Nunnery on: 07/31/2015 02:43 PM   Modules accepted: Orders

## 2015-07-31 NOTE — Progress Notes (Signed)
Subjective:   Patient ID: Jennifer Ware, female    DOB: Mar 11, 1971, 44 y.o.   MRN: [id number]  Jennifer Ware is a pleasant 44 y.o. year old female who presents to clinic today with Annual Exam and Menstrual Problem  on 07/31/2015  HPI:   Doing well. Has no complaints.  Pap smear 04/22/14- Dr. Harolyn Rutherford.  She would like for me to do her pap smear today. Mammogram 05/08/15   Has lost weight- per pt, intentional- she has been making smarter food choice.  Lab Results  Component Value Date   CHOL 164 07/27/2014   HDL 51.40 07/27/2014   LDLCALC 106* 07/27/2014   TRIG 35.0 07/27/2014   CHOLHDL 3 07/27/2014   Lab Results  Component Value Date   CREATININE 0.8 07/27/2014   Lab Results  Component Value Date   TSH 1.23 07/27/2014   Lab Results  Component Value Date   NA 135 07/27/2014   K 3.6 07/27/2014   CL 105 07/27/2014   CO2 25 07/27/2014   Lab Results  Component Value Date   WBC 5.6 01/20/2015   HGB 12.3 01/20/2015   HCT 37.8 01/20/2015   MCV 83.4 01/20/2015   PLT 160 01/20/2015   Current Outpatient Prescriptions on File Prior to Visit  Medication Sig Dispense Refill  . Ascorbic Acid (VITAMIN C) 1000 MG tablet Take 1,000 mg by mouth daily.    Marland Kitchen BIOTIN 5000 PO Take by mouth daily.    . Calcium 600-200 MG-UNIT per tablet Take 2 tablets by mouth daily.    . Cranberry 1000 MG CAPS Take 500 mg by mouth as needed.     . cyanocobalamin 1000 MCG tablet Take 100 mcg by mouth daily.    . Ginkgo Biloba 40 MG TABS Take 1 tablet by mouth daily.    Marland Kitchen glucosamine-chondroitin 500-400 MG tablet Take 1 tablet by mouth 2 (two) times daily.    . Lecithin 1200 MG CAPS Take 1 capsule by mouth as needed.     . meloxicam (MOBIC) 15 MG tablet Take 15 mg by mouth daily.    . Methyl Salicylate 25 % CREA Apply to affected areas three times daily as needed 1 Tube 3  . Multiple Vitamin (MULTIVITAMIN) tablet Take 1 tablet by mouth daily.      . Multiple Vitamins-Minerals (ANTIOXIDANT PO) Take  by mouth daily.    . pantoprazole (PROTONIX) 20 MG tablet Take 20 mg by mouth daily.    Marland Kitchen VITAMIN A PO Take 1 tablet by mouth daily.     No current facility-administered medications on file prior to visit.    No Known Allergies  Past Medical History  Diagnosis Date  . Thyroid disease   . Hx gestational diabetes   . Anxiety   . GERD (gastroesophageal reflux disease)   . Heart murmur     Past Surgical History  Procedure Laterality Date  . Umbilical hernia repair    . Hernia repair      umbilical  . Cesarean section      x3  . Breast biopsy Right 08/24/2014    negative, u/s core    Family History  Problem Relation Age of Onset  . Asthma Father   . Colon cancer Neg Hx     Social History   Social History  . Marital Status: Married    Spouse Name: N/A  . Number of Children: 3  . Years of Education: N/A   Occupational History  . LPN  Social History Main Topics  . Smoking status: Never Smoker   . Smokeless tobacco: Never Used  . Alcohol Use: No  . Drug Use: No  . Sexual Activity:    Partners: Male    Birth Control/ Protection: None   Other Topics Concern  . Not on file   Social History Narrative   Originally from [place].   Has lived in area for 14 years.   3 children.   Works as a Marine scientist for [employer].     The PMH, PSH, Social History, Family History, Medications, and allergies have been reviewed in [facility], and have been updated if relevant.   Review of Systems  Constitutional: Negative for fever, chills, appetite change, fatigue and unexpected weight change.  HENT: Negative.   Eyes: Negative.   Respiratory: Negative.   Cardiovascular: Negative for chest pain, palpitations and leg swelling.  Gastrointestinal: Negative.   Endocrine: Negative for cold intolerance, heat intolerance, polydipsia, polyphagia and polyuria.  Genitourinary: Negative.   Musculoskeletal: Negative for myalgias, neck pain and neck stiffness.  Skin: Negative.   Neurological: Negative.     Hematological: Negative.   Psychiatric/Behavioral: Negative.   All other systems reviewed and are negative.      Objective:    BP 120/74 mmHg  Pulse 76  Temp(Src) 97.7 F (36.5 C) (Oral)  Ht 5\' 2"  (1.575 m)  Wt 143 lb 12 oz (65.205 kg)  BMI 26.29 kg/m2  SpO2 98%  LMP 07/13/2015 Wt Readings from Last 3 Encounters:  07/31/15 143 lb 12 oz (65.205 kg)  01/20/15 158 lb (71.668 kg)  07/27/14 155 lb 8 oz (70.534 kg)     Physical Exam  Constitutional: She is oriented to person, place, and time. She appears well-developed and well-nourished. No distress.  HENT:  Head: Normocephalic and atraumatic.  Eyes: Pupils are equal, round, and reactive to light.  Neck: Normal range of motion. Neck supple. No thyromegaly present.  Cardiovascular: Normal rate, regular rhythm and normal heart sounds.   Pulmonary/Chest: Effort normal and breath sounds normal. No respiratory distress. She has no wheezes. She has no rales. She exhibits no tenderness.  Abdominal: Soft.  Genitourinary: Rectum normal, vagina normal and uterus normal. No breast swelling, discharge or bleeding. There is no rash on the right labia. There is no rash on the left labia. Cervix exhibits no motion tenderness. Right adnexum displays no mass, no tenderness and no fullness. Left adnexum displays no mass, no tenderness and no fullness.  Musculoskeletal:       Right knee: She exhibits normal range of motion, no swelling, no effusion, no ecchymosis, no deformity, no laceration, no erythema, normal alignment, normal patellar mobility, normal meniscus and no MCL laxity. No tenderness found. No medial joint line tenderness noted.       Left knee: Normal.  Neurological: She is alert and oriented to person, place, and time. No cranial nerve deficit. Coordination normal.  Skin: Skin is warm and dry.  Psychiatric: She has a normal mood and affect. Her behavior is normal. Judgment and thought content normal.  Nursing note and vitals  reviewed.         Assessment & Plan:   Well woman exam (no gynecological exam) - Plan: CBC with Differential/Platelet, Comprehensive metabolic panel, Lipid panel, TSH, Vitamin D, 25-hydroxy, HIV antibody (with reflex)  Essential hypertension  Thyroid disease  Candidiasis of vagina - Plan: miconazole (MICOTIN) 200 MG vaginal suppository No Follow-up on file.

## 2015-08-01 ENCOUNTER — Telehealth: Payer: Self-pay | Admitting: Family Medicine

## 2015-08-01 ENCOUNTER — Encounter: Payer: Self-pay | Admitting: *Deleted

## 2015-08-01 LAB — HIV ANTIBODY (ROUTINE TESTING W REFLEX): HIV 1&2 Ab, 4th Generation: NONREACTIVE

## 2015-08-01 NOTE — Telephone Encounter (Signed)
Please call pt- I am not sure what she is requesting.

## 2015-08-01 NOTE — Telephone Encounter (Signed)
Pt called back after I spoke with her. She would also like to have food allergy igg labs added if possible. She c/o pain and occasional rashes after eating, she couldn't specify any certain type of food that causes this. Is this ok to add as well?

## 2015-08-01 NOTE — Telephone Encounter (Signed)
Pt called back, I spoke with her and she said she wanted to have IGG test done due to generalized pain she's been having throughout her body. The test can be run off the blood that was sent to [facility] yesterday. Is it ok to add?

## 2015-08-01 NOTE — Telephone Encounter (Signed)
Yes ok to add.

## 2015-08-01 NOTE — Telephone Encounter (Signed)
Pt called asking for Dr. Deborra Medina to add an IGG lab onto the labs she had drawn yesterday. The best number to reach her is [phone].

## 2015-08-02 ENCOUNTER — Encounter: Payer: Self-pay | Admitting: *Deleted

## 2015-08-02 LAB — IGG: IGG (IMMUNOGLOBIN G), SERUM: 1710 mg/dL — AB (ref 690–1700)

## 2015-08-02 LAB — CYTOLOGY - PAP

## 2015-08-02 NOTE — Telephone Encounter (Signed)
[facility] called this am, and labs added. Pending results.

## 2015-08-03 LAB — CERVICOVAGINAL ANCILLARY ONLY
BACTERIAL VAGINITIS: NEGATIVE
CANDIDA VAGINITIS: NEGATIVE

## 2015-08-04 LAB — CERVICOVAGINAL ANCILLARY ONLY: HERPES (WINDOWPATH): NEGATIVE

## 2015-08-08 LAB — COMPREHENSIVE METABOLIC PANEL
ALBUMIN: 4.2 g/dL (ref 3.5–5.2)
ALK PHOS: 72 U/L (ref 39–117)
ALT: 21 U/L (ref 0–35)
AST: 21 U/L (ref 0–37)
BILIRUBIN TOTAL: 0.6 mg/dL (ref 0.2–1.2)
BUN: 14 mg/dL (ref 6–23)
CO2: 29 mEq/L (ref 19–32)
Calcium: 10.1 mg/dL (ref 8.4–10.5)
Chloride: 102 mEq/L (ref 96–112)
Creatinine, Ser: 0.82 mg/dL (ref 0.40–1.20)
GFR: 96.81 mL/min (ref >60.00)
GLUCOSE: 97 mg/dL (ref 70–99)
Potassium: 3.4 mEq/L — ABNORMAL LOW (ref 3.5–5.1)
SODIUM: 138 meq/L (ref 135–145)
TOTAL PROTEIN: 7.5 g/dL (ref 6.0–8.3)

## 2015-08-08 LAB — IGG FOOD PANEL
ALLERGEN EGG WHITE IGG: 2.3 ug/mL — AB (ref <2.0)
Allergen, Milk, IgG: 9.06 ug/mL — ABNORMAL HIGH (ref <0.15)
Beef, IgG: 8.1 ug/mL — ABNORMAL HIGH (ref <2.0)
Peanut, IgG: <0.15 ug/mL (ref <0.15)
Wheat, IgG: <0.15 ug/mL (ref <0.15)

## 2015-10-24 ENCOUNTER — Telehealth: Payer: Self-pay

## 2015-10-24 DIAGNOSIS — Z1239 Encounter for other screening for malignant neoplasm of breast: Secondary | ICD-10-CM

## 2015-10-24 DIAGNOSIS — N6310 Unspecified lump in the right breast, unspecified quadrant: Secondary | ICD-10-CM

## 2015-10-24 NOTE — Telephone Encounter (Signed)
pt left v/m requesting order for diagnostic mammogram [facility]. Pt request cb. Pt last annual exam 07/31/15.

## 2015-10-25 NOTE — Telephone Encounter (Signed)
Order placed

## 2015-10-27 ENCOUNTER — Telehealth: Payer: Self-pay | Admitting: Family Medicine

## 2015-10-27 NOTE — Telephone Encounter (Signed)
Patient returned Jennifer Ware's call to schedule diagnostic mammogram.

## 2015-11-01 NOTE — Telephone Encounter (Signed)
Appointment 4/10 Left message letting pt know about date and time

## 2015-11-01 NOTE — Addendum Note (Signed)
Addended by: Lucille Passy on: 11/01/2015 09:58 AM   Modules accepted: Orders

## 2015-11-01 NOTE — Telephone Encounter (Signed)
Spoke with pt She stated no new problems  She also stated she need to have both breast done  Spoke with jamie @ [facility] to see what order needed to be done She stated pt needs bilateral screening mammogram

## 2015-11-01 NOTE — Telephone Encounter (Signed)
If new problem needs to have the position

## 2015-11-01 NOTE — Telephone Encounter (Signed)
Please call pt to get more information. 

## 2015-11-01 NOTE — Telephone Encounter (Signed)
Order entered

## 2015-11-01 NOTE — Telephone Encounter (Signed)
i spoke with Jennifer Ware @ [facility] She wanted to know if the pt is having a new mass on right breast.  If not the order needs to be changed to bilateral screen.  Her right breast was cleared 9/16.

## 2015-11-20 ENCOUNTER — Ambulatory Visit: Payer: 59 | Attending: Family Medicine

## 2015-12-18 ENCOUNTER — Ambulatory Visit
Admission: RE | Admit: 2015-12-18 | Discharge: 2015-12-18 | Disposition: A | Discharge status: Home / Self Care | Payer: 59 | Source: Ambulatory Visit | Attending: Family Medicine | Admitting: Family Medicine

## 2015-12-18 ENCOUNTER — Other Ambulatory Visit: Payer: Self-pay | Admitting: Family Medicine

## 2015-12-18 DIAGNOSIS — Z1239 Encounter for other screening for malignant neoplasm of breast: Secondary | ICD-10-CM

## 2015-12-18 DIAGNOSIS — Z1231 Encounter for screening mammogram for malignant neoplasm of breast: Secondary | ICD-10-CM | POA: Insufficient documentation

## 2016-01-22 ENCOUNTER — Other Ambulatory Visit: Payer: Self-pay

## 2016-01-22 DIAGNOSIS — N390 Urinary tract infection, site not specified: Secondary | ICD-10-CM

## 2016-01-22 NOTE — Telephone Encounter (Signed)
Pt left v/m requesting refill; left v/m requesting cb; need name of med. Last annual 07/31/15.

## 2016-01-24 MED ORDER — NITROFURANTOIN MONOHYD MACRO 100 MG PO CAPS: ORAL_CAPSULE | ORAL | Status: DC

## 2016-01-24 NOTE — Telephone Encounter (Signed)
Pt requesting refill macrobid to [facility]. Pt said she takes as prn preventative med for UTI. Pt does not have any symptoms now. Pt said Dr Deborra Medina had filled before; I spoke with Ovid Curd at [facility] and last refilled by Dr Harolyn Rutherford # [id number] on 01/30/15. When asked how pt takes med (ie one tab daily). Pt said no she just takes when she thinks she needs it. Pt request cb when refilled. Last annual 07/31/15.

## 2016-01-24 NOTE — Telephone Encounter (Signed)
Pt left vm requesting cb. Left v/m for pt to cb.

## 2016-05-28 DIAGNOSIS — H52223 Regular astigmatism, bilateral: Secondary | ICD-10-CM | POA: Diagnosis not present

## 2016-05-28 DIAGNOSIS — H524 Presbyopia: Secondary | ICD-10-CM | POA: Diagnosis not present

## 2016-06-26 ENCOUNTER — Ambulatory Visit (INDEPENDENT_AMBULATORY_CARE_PROVIDER_SITE_OTHER): Payer: 59 | Admitting: Family Medicine

## 2016-06-26 ENCOUNTER — Encounter: Payer: Self-pay | Admitting: Family Medicine

## 2016-06-26 VITALS — BP 100/80 | HR 82 | Temp 98.3°F | Ht 62.0 in | Wt 145.8 lb

## 2016-06-26 DIAGNOSIS — M5432 Sciatica, left side: Secondary | ICD-10-CM

## 2016-06-26 NOTE — Progress Notes (Signed)
Dr. Frederico Hamman T. Laetitia Schnepf, MD, [facility] [place], [address] Phone: [phone] Fax: [phone]  06/26/2016  Patient: Jennifer Ware, MRN: [id number], DOB: 29-Jan-1971, 45 y.o.  Primary Physician:  Arnette Norris, MD   Chief Complaint  Patient presents with  . Leg Pain    From Hip down left leg   Subjective:   Jennifer Ware is a 45 y.o. very pleasant female patient who presents with the following:  Radicular pain down her left leg. When walking a long time, will get some sharp pains and pulsating.  This is been ongoing for a few months, she is having some pain in the posterior aspect of her pelvis without pain in the groin.  She is also having some radicular pain down the posterior aspect of her left leg.  She is not having any clear numbness or weakness, but she is having pain down the posterior aspect of the leg the on the knee.  No trauma or accident.  No prior spine surgery.  She has most of this is worse when she is up on her feet walking, she works as a Marine scientist on hard floors.  Sometimes this is worse with a sitting position.  With doing activity worse.   Past Medical History, Surgical History, Social History, Family History, Problem List, Medications, and Allergies have been reviewed and updated if relevant.  Patient Active Problem List   Diagnosis Date Noted  . Well woman exam (no gynecological exam) 07/27/2014  . HTN (hypertension) 04/25/2014  . Dyspareunia 04/19/2014  . Irregular periods/menstrual cycles 03/04/2014  . Chronic UTI 12/24/2011  . Abnormal heart rhythm 12/10/2011  . Thyroid disease     Past Medical History:  Diagnosis Date  . Anxiety   . GERD (gastroesophageal reflux disease)   . Heart murmur   . Hx gestational diabetes   . Thyroid disease     Past Surgical History:  Procedure Laterality Date  . BREAST BIOPSY Right 08/24/2014   negative, u/s core  . CESAREAN SECTION     x3  . HERNIA  REPAIR     umbilical  . UMBILICAL HERNIA REPAIR      Social History   Social History  . Marital status: Married    Spouse name: N/A  . Number of children: 3  . Years of education: N/A   Occupational History  . LPN [employer]   Social History Main Topics  . Smoking status: Never Smoker  . Smokeless tobacco: Never Used  . Alcohol use No  . Drug use: No  . Sexual activity: Yes    Partners: Male    Birth control/ protection: None   Other Topics Concern  . Not on file   Social History Narrative   Originally from [place].   Has lived in area for 14 years.   3 children.   Works as a Marine scientist for [employer].      Family History  Problem Relation Age of Onset  . Asthma Father   . Colon cancer Neg Hx     Allergies  Allergen Reactions  . Flagyl [Metronidazole]   . Sulfa Antibiotics     Medication list reviewed and updated in full in [facility].  GEN: No fevers, chills. Nontoxic. Primarily MSK c/o today. MSK: Detailed in the HPI GI: tolerating PO intake without difficulty Neuro: No numbness, parasthesias, or tingling associated. Otherwise the pertinent positives of the ROS are noted above.   Objective:  BP 100/80   Pulse 82   Temp 98.3 F (36.8 C) (Oral)   Ht 5\' 2"  (1.575 m)   Wt 145 lb 12 oz (66.1 kg)   LMP 05/30/2016   BMI 26.66 kg/m    GEN: Well-developed,well-nourished,in no acute distress; alert,appropriate and cooperative throughout examination HEENT: Normocephalic and atraumatic without obvious abnormalities. Ears, externally no deformities PULM: Breathing comfortably in no respiratory distress EXT: No clubbing, cyanosis, or edema PSYCH: Normally interactive. Cooperative during the interview. Pleasant. Friendly and conversant. Not anxious or depressed appearing. Normal, full affect.  Range of motion at  the waist: Flexion: normal Extension: normal Lateral bending: normal Rotation: all normal  No echymosis or edema Rises to examination table  with no difficulty Gait: non antalgic  Inspection/Deformity: N Paraspinus Tenderness: mild l4-s1 b Some pain in the buttocks region - L  B Ankle Dorsiflexion (L5,4): 5/5 B Great Toe Dorsiflexion (L5,4): 5/5 Heel Walk (L5): WNL Toe Walk (S1): WNL Rise/Squat (L4): WNL  SENSORY B Medial Foot (L4): WNL B Dorsum (L5): WNL B Lateral (S1): WNL Light Touch: WNL Pinprick: WNL  REFLEXES Knee (L4): 2+ Ankle (S1): 2+  B SLR, seated: neg B SLR, supine: neg B FABER: neg B Reverse FABER: + B Greater Troch: NT B Log Roll: neg B Stork: NT B Sciatic Notch: mild pain  Radiology: No results found.   Assessment and Plan:   Sciatica, left side  >25 minutes spent in face to face time with patient, >50% spent in counselling or coordination of care  Left-sided sciatica.  More likely piriformis syndrome, cannot fully exclude spinal origin of pathology.  The patient would like to be as conservative as possible, which I think is not entirely unreasonable.  She is still having symptoms in 4-6 weeks, recommended follow-up.  I gave her an extensive back and piriformis rehabilitation program to work on at home.  Follow-up: No Follow-up on file.  Signed,  [name]. Vito Beg, MD     Medication List       Accurate as of 06/26/16 11:59 PM. Always use your most recent med list.          ANTIOXIDANT PO Take by mouth daily.   BIOTIN 5000 PO Take by mouth daily.   Calcium 600-200 MG-UNIT tablet Take 2 tablets by mouth daily.   Cranberry 1000 MG Caps Take 500 mg by mouth as needed.   cyanocobalamin 1000 MCG tablet Take 100 mcg by mouth daily.   Fluocinolone Acetonide 0.01 % Oil 2 drops to affected ear, No more than 1 week at a time   Ginkgo Biloba 40 MG Tabs Take 1 tablet by mouth daily.   glucosamine-chondroitin 500-400 MG tablet Take 1 tablet by mouth 2 (two) times daily.   Lecithin 1200 MG Caps Take 1 capsule by mouth as needed.   meloxicam 15 MG tablet Commonly  known as:  MOBIC Take 15 mg by mouth daily.   Methyl Salicylate 25 % Crea Apply to affected areas three times daily as needed   miconazole 200 MG vaginal suppository Commonly known as:  MICOTIN Place 1 suppository (200 mg total) vaginally at bedtime. For three nights   multivitamin tablet Take 1 tablet by mouth daily.   nitrofurantoin (macrocrystal-monohydrate) 100 MG capsule Commonly known as:  MACROBID Take as directed for UTI suppression   pantoprazole 20 MG tablet Commonly known as:  PROTONIX Take 20 mg by mouth daily.   VITAMIN A PO Take 1 tablet by mouth daily.   vitamin C  1000 MG tablet Take 1,000 mg by mouth daily.

## 2016-06-26 NOTE — Progress Notes (Signed)
Pre visit review using our clinic review tool, if applicable. No additional management support is needed unless otherwise documented below in the visit note. 

## 2016-07-31 ENCOUNTER — Encounter: Payer: 59 | Admitting: Family Medicine

## 2016-08-07 ENCOUNTER — Encounter: Payer: Self-pay | Admitting: Internal Medicine

## 2016-08-07 ENCOUNTER — Ambulatory Visit (INDEPENDENT_AMBULATORY_CARE_PROVIDER_SITE_OTHER): Payer: 59 | Admitting: Internal Medicine

## 2016-08-07 VITALS — BP 108/80 | HR 74 | Temp 98.6°F | Ht 62.75 in | Wt 146.0 lb

## 2016-08-07 DIAGNOSIS — Z23 Encounter for immunization: Secondary | ICD-10-CM

## 2016-08-07 DIAGNOSIS — Z298 Encounter for other specified prophylactic measures: Secondary | ICD-10-CM

## 2016-08-07 DIAGNOSIS — N390 Urinary tract infection, site not specified: Secondary | ICD-10-CM

## 2016-08-07 DIAGNOSIS — Z0000 Encounter for general adult medical examination without abnormal findings: Secondary | ICD-10-CM

## 2016-08-07 DIAGNOSIS — K219 Gastro-esophageal reflux disease without esophagitis: Secondary | ICD-10-CM

## 2016-08-07 DIAGNOSIS — M170 Bilateral primary osteoarthritis of knee: Secondary | ICD-10-CM

## 2016-08-07 DIAGNOSIS — Z124 Encounter for screening for malignant neoplasm of cervix: Secondary | ICD-10-CM

## 2016-08-07 HISTORY — DX: Bilateral primary osteoarthritis of knee: M17.0

## 2016-08-07 LAB — COMPREHENSIVE METABOLIC PANEL
ALBUMIN: 3.9 g/dL (ref 3.5–5.2)
ALT: 36 U/L — ABNORMAL HIGH (ref 0–35)
AST: 26 U/L (ref 0–37)
Alkaline Phosphatase: 71 U/L (ref 39–117)
BUN: 13 mg/dL (ref 6–23)
CHLORIDE: 103 meq/L (ref 96–112)
CO2: 30 mEq/L (ref 19–32)
Calcium: 9.4 mg/dL (ref 8.4–10.5)
Creatinine, Ser: 0.85 mg/dL (ref 0.40–1.20)
GFR: 92.46 mL/min (ref >60.00)
GLUCOSE: 101 mg/dL — AB (ref 70–99)
POTASSIUM: 4 meq/L (ref 3.5–5.1)
SODIUM: 138 meq/L (ref 135–145)
Total Bilirubin: 0.4 mg/dL (ref 0.2–1.2)
Total Protein: 7.2 g/dL (ref 6.0–8.3)

## 2016-08-07 LAB — CBC
HEMATOCRIT: 40.5 % (ref 36.0–46.0)
Hemoglobin: 13.8 g/dL (ref 12.0–15.0)
MCHC: 34 g/dL (ref 30.0–36.0)
MCV: 85.2 fl (ref 78.0–100.0)
Platelets: 115 10*3/uL — ABNORMAL LOW (ref 150.0–400.0)
RBC: 4.75 Mil/uL (ref 3.87–5.11)
RDW: 13.7 % (ref 11.5–15.5)
WBC: 3.2 10*3/uL — ABNORMAL LOW (ref 4.0–10.5)

## 2016-08-07 LAB — LIPID PANEL
Cholesterol: 149 mg/dL (ref 0–200)
HDL: 60.9 mg/dL (ref >39.00)
LDL Cholesterol: 81 mg/dL (ref 0–99)
NONHDL: 88.32
Total CHOL/HDL Ratio: 2
Triglycerides: 35 mg/dL (ref 0.0–149.0)
VLDL: 7 mg/dL (ref 0.0–40.0)

## 2016-08-07 LAB — TSH: TSH: 1.02 u[IU]/mL (ref 0.35–4.50)

## 2016-08-07 LAB — HEMOGLOBIN A1C: Hgb A1c MFr Bld: 5.3 % (ref 4.6–6.5)

## 2016-08-07 MED ORDER — FLUOCINOLONE ACETONIDE 0.01 % OT OIL: TOPICAL_OIL | OTIC | 3 refills | Status: DC

## 2016-08-07 MED ORDER — MELOXICAM 15 MG PO TABS: 15.0000 mg | ORAL_TABLET | Freq: Every day | ORAL | 11 refills | Status: DC

## 2016-08-07 MED ORDER — NITROFURANTOIN MONOHYD MACRO 100 MG PO CAPS: ORAL_CAPSULE | ORAL | 1 refills | Status: DC

## 2016-08-07 NOTE — Assessment & Plan Note (Signed)
Meloxicam refilled today Encouraged her to stay active

## 2016-08-07 NOTE — Assessment & Plan Note (Signed)
Currently not an issue Will monitor 

## 2016-08-07 NOTE — Patient Instructions (Signed)

## 2016-08-07 NOTE — Progress Notes (Signed)
Subjective:    Patient ID: Jennifer Ware, female    DOB: 01/07/1971, 45 y.o.   MRN: [id number]  HPI  Pt, of Dr. Hulen Shouts, presents to the clinic today for her annual exam. She is also due to follow up chronic conditions.  Chronic UTI: Intermittent. She has Macrobid to take daily but reports she only takes it as needed. She is requesting a refill of the Macrobid today.  OA bilateral knees: She reports her knees crack and pop. It is worse if she tries to bend down or walk up stairs. She takes Meloxicam with good relief. She is requesting a refill of this today.  GERD: She is not sure what triggers this. She is no longer taking Protonix.   She is also requesting malaria prophylaxis. She is travelling to [place] in March or April 2018. She does not know her exact travel dates.  Flu: 04/2016 Tetanus: > 10 years ago Pap Smear: 07/2015 Mammogram: 12/2015 Vision Screening: annually Dentist: biannually  Diet: She does eat meat. She consumes fruits and veggies daily. She does eat some fried foods. She drinks mostly water. Exercise: She walks and does weight lifting at home 3 days a week.   Review of Systems      Past Medical History:  Diagnosis Date  . Anxiety   . GERD (gastroesophageal reflux disease)   . Heart murmur   . Hx gestational diabetes   . Thyroid disease     Current Outpatient Prescriptions  Medication Sig Dispense Refill  . Ascorbic Acid (VITAMIN C) 1000 MG tablet Take 1,000 mg by mouth daily.    Marland Kitchen BIOTIN 5000 PO Take by mouth daily.    . Calcium 600-200 MG-UNIT per tablet Take 2 tablets by mouth daily.    . Cranberry 1000 MG CAPS Take 500 mg by mouth as needed.     . cyanocobalamin 1000 MCG tablet Take 100 mcg by mouth daily.    . Fluocinolone Acetonide 0.01 % OIL 2 drops to affected ear, No more than 1 week at a time 1 Bottle 3  . Ginkgo Biloba 40 MG TABS Take 1 tablet by mouth daily.    Marland Kitchen glucosamine-chondroitin 500-400 MG tablet Take 1 tablet by mouth 2 (two) times  daily.    . Lecithin 1200 MG CAPS Take 1 capsule by mouth as needed.     . meloxicam (MOBIC) 15 MG tablet Take 15 mg by mouth daily.    . Methyl Salicylate 25 % CREA Apply to affected areas three times daily as needed 1 Tube 3  . miconazole (MICOTIN) 200 MG vaginal suppository Place 1 suppository (200 mg total) vaginally at bedtime. For three nights 3 suppository 4  . Multiple Vitamin (MULTIVITAMIN) tablet Take 1 tablet by mouth daily.      . Multiple Vitamins-Minerals (ANTIOXIDANT PO) Take by mouth daily.    . nitrofurantoin, macrocrystal-monohydrate, (MACROBID) 100 MG capsule Take as directed for UTI suppression 30 capsule 1  . pantoprazole (PROTONIX) 20 MG tablet Take 20 mg by mouth daily.    Marland Kitchen VITAMIN A PO Take 1 tablet by mouth daily.     No current facility-administered medications for this visit.     Allergies  Allergen Reactions  . Flagyl [Metronidazole]   . Sulfa Antibiotics     Family History  Problem Relation Age of Onset  . Asthma Father   . Colon cancer Neg Hx     Social History   Social History  . Marital status: Married  Spouse name: N/A  . Number of children: 3  . Years of education: N/A   Occupational History  . LPN [employer]   Social History Main Topics  . Smoking status: Never Smoker  . Smokeless tobacco: Never Used  . Alcohol use No  . Drug use: No  . Sexual activity: Yes    Partners: Male    Birth control/ protection: None   Other Topics Concern  . Not on file   Social History Narrative   Originally from [place].   Has lived in area for 14 years.   3 children.   Works as a Marine scientist for [employer].       Constitutional: Denies fever, malaise, fatigue, headache or abrupt weight changes.  HEENT: Pt reports itchy ears and scratchy throat. Denies eye pain, eye redness, ear pain, ringing in the ears, wax buildup, runny nose, nasal congestion, bloody nose, or sore throat. Respiratory: Denies difficulty breathing, shortness of breath, cough or  sputum production.   Cardiovascular: Denies chest pain, chest tightness, palpitations or swelling in the hands or feet.  Gastrointestinal: Denies abdominal pain, bloating, constipation, diarrhea or blood in the stool.  GU: Denies urgency, frequency, pain with urination, burning sensation, blood in urine, odor or discharge. Musculoskeletal: Pt reports bilateral knee pain. Denies decrease in range of motion, difficulty with gait, muscle pain or joint swelling.  Skin: Denies redness, rashes, lesions or ulcercations.  Neurological: Denies dizziness, difficulty with memory, difficulty with speech or problems with balance and coordination.  Psych: Denies anxiety, depression, SI/HI.  No other specific complaints in a complete review of systems (except as listed in HPI above).  Objective:   Physical Exam  BP 108/80   Pulse 74   Temp 98.6 F (37 C) (Oral)   Ht 5' 2.75" (1.594 m)   Wt 146 lb (66.2 kg)   LMP 07/24/2016   SpO2 100%   BMI 26.07 kg/m  Wt Readings from Last 3 Encounters:  08/07/16 146 lb (66.2 kg)  06/26/16 145 lb 12 oz (66.1 kg)  07/31/15 143 lb 12 oz (65.2 kg)    General: Appears her stated age, well developed, well nourished in NAD. Skin: Warm, dry and intact.  HEENT: Head: normal shape and size; Eyes: sclera white, no icterus, conjunctiva pink, PERRLA and EOMs intact; Ears: Tm's gray and intact, normal light reflex; Throat/Mouth: Teeth present, mucosa pink and moist, no exudate, lesions or ulcerations noted.  Neck:  Neck supple, trachea midline. No masses, lumps present. Thyromegaly noted.  Cardiovascular: Normal rate and rhythm. S1,S2 noted.  No murmur, rubs or gallops noted. No JVD or BLE edema.  Pulmonary/Chest: Normal effort and positive vesicular breath sounds. No respiratory distress. No wheezes, rales or ronchi noted.  Abdomen: Soft and nontender. Normal bowel sounds. No distention or masses noted. Liver, spleen and kidneys non palpable. Pelvic: Normal female  anatomy. Cervix without changes. No discharge or odor present. No CMT. Adnexa non palpable. Breast symmetrical. No masses or lumps noted. No discharge expressed from the nipple. Musculoskeletal: Normal range of motion. Strength 5/5 BUE/BLE. No difficulty with gait.  Neurological: Alert and oriented. Cranial nerves II-XII grossly intact. Coordination normal.  Psychiatric: Mood and affect normal. Behavior is normal. Judgment and thought content normal.     BMET    Component Value Date/Time   NA 138 07/31/2015 1438   K 3.4 (L) 07/31/2015 1438   CL 102 07/31/2015 1438   CO2 29 07/31/2015 1438   GLUCOSE 97 07/31/2015 1438   BUN  14 07/31/2015 1438   CREATININE 0.82 07/31/2015 1438   CALCIUM 10.1 07/31/2015 1438    Lipid Panel     Component Value Date/Time   CHOL 161 07/31/2015 1438   TRIG 39.0 07/31/2015 1438   HDL 59.70 07/31/2015 1438   CHOLHDL 3 07/31/2015 1438   VLDL 7.8 07/31/2015 1438   LDLCALC 93 07/31/2015 1438    CBC    Component Value Date/Time   WBC 6.4 07/31/2015 1438   RBC 4.79 07/31/2015 1438   HGB 13.6 07/31/2015 1438   HCT 41.4 07/31/2015 1438   PLT 148.0 (L) 07/31/2015 1438   MCV 86.6 07/31/2015 1438   MCH 27.2 01/20/2015 1434   MCHC 32.8 07/31/2015 1438   RDW 14.4 07/31/2015 1438   LYMPHSABS 2.0 07/31/2015 1438   MONOABS 0.4 07/31/2015 1438   EOSABS 0.1 07/31/2015 1438   BASOSABS 0.0 07/31/2015 1438    Hgb A1C Lab Results  Component Value Date   HGBA1C 5.2 07/31/2015         Assessment & Plan:   Preventative Health Maintenance:  Flu shot UTD Tdap today Pap smear today, upon her request Mammogram UTD Encouraged her to consume a balanced diet and exercise regimen Advised her to see an eye doctor and dentist at least annually Will check CBC, CMET, Lipid, TSH and A1C today  Malaria prophylaxis:  Advised her that I have to know her exact travel dates in order to get her an RX for malaria prophylaxis She will call back with this  info  RTC in 1 year for annual exam with PCP Webb Silversmith, NP

## 2016-08-07 NOTE — Assessment & Plan Note (Signed)
Advised her Macrobid was to be taken daily for prophylaxis, not prn Refilled today

## 2016-08-09 ENCOUNTER — Other Ambulatory Visit (HOSPITAL_COMMUNITY)
Admission: RE | Admit: 2016-08-09 | Discharge: 2016-08-09 | Disposition: A | Discharge status: Home / Self Care | Payer: 59 | Source: Ambulatory Visit | Attending: Family Medicine | Admitting: Family Medicine

## 2016-08-09 DIAGNOSIS — Z01419 Encounter for gynecological examination (general) (routine) without abnormal findings: Secondary | ICD-10-CM | POA: Insufficient documentation

## 2016-08-09 NOTE — Addendum Note (Signed)
Addended by: Lurlean Nanny on: 08/09/2016 08:56 AM   Modules accepted: Orders

## 2016-08-13 LAB — CYTOLOGY - PAP
ADEQUACY: ABSENT
Diagnosis: NEGATIVE

## 2016-08-27 NOTE — Addendum Note (Signed)
Addended by: Lurlean Nanny on: 08/27/2016 09:40 AM   Modules accepted: Orders

## 2016-12-25 ENCOUNTER — Other Ambulatory Visit: Payer: Self-pay | Admitting: Family Medicine

## 2016-12-25 DIAGNOSIS — Z1231 Encounter for screening mammogram for malignant neoplasm of breast: Secondary | ICD-10-CM

## 2017-01-20 ENCOUNTER — Other Ambulatory Visit: Payer: Self-pay | Admitting: Family Medicine

## 2017-01-20 ENCOUNTER — Ambulatory Visit
Admission: RE | Admit: 2017-01-20 | Discharge: 2017-01-20 | Disposition: A | Discharge status: Home / Self Care | Payer: 59 | Source: Ambulatory Visit | Attending: Family Medicine | Admitting: Family Medicine

## 2017-01-20 DIAGNOSIS — Z1231 Encounter for screening mammogram for malignant neoplasm of breast: Secondary | ICD-10-CM

## 2017-02-19 ENCOUNTER — Telehealth: Payer: Self-pay

## 2017-02-19 NOTE — Telephone Encounter (Signed)
Pt left /vm; pt will be traveling to [place] in 2 weeks and request med for malaria. Pt request cb.

## 2017-02-19 NOTE — Telephone Encounter (Signed)
Which part of [place] will she be traveling to?  And what will she be doing while there? This will impact which rxs we prescribe.

## 2017-02-19 NOTE — Telephone Encounter (Signed)
Left detailed message.  Pt to call back with this info.

## 2017-02-20 MED ORDER — CIPROFLOXACIN HCL 500 MG PO TABS: 500.0000 mg | ORAL_TABLET | Freq: Two times a day (BID) | ORAL | 0 refills | Status: DC

## 2017-02-20 MED ORDER — ATOVAQUONE-PROGUANIL HCL 250-100 MG PO TABS: 1.0000 | ORAL_TABLET | Freq: Every day | ORAL | 0 refills | Status: DC

## 2017-02-20 NOTE — Telephone Encounter (Signed)
Pt called back. She is going to [place] to take care of her mother for about 3wks to 1 mo. Pt states when she travels to [place] she gets loose stools that lasts for extended time, poss from water. She is asking if there is anything to help with this, someone mentioned Cipro antibiotics? Patient is leaving 03/05/17.

## 2017-02-20 NOTE — Telephone Encounter (Signed)
Cipro eR sent to [facility]. I tried to send in Malarone (malaria prophylaxis rx) but the instructions were too long for e prescribing.  Please call in malarone as entered.  Thanks so much. Please tell her to have a safe and wonderful trip.

## 2017-02-20 NOTE — Telephone Encounter (Signed)
rx was faxed and pt was notified of Dr. Hulen Shouts instructions.

## 2017-05-11 IMAGING — MG MM DIAG BREAST TOMO UNI RIGHT
8 series · 8 of 16 positions shown · non-contrast
Comparison: 08/15/2014, 08/05/2014, 07/14/2013.

CLINICAL DATA: 6 month re-evaluation of benign concordant right
axillary lymph node ultrasound-guided core biopsy (demonstrating
benign reactive changes).

EXAM:
DIGITAL DIAGNOSTIC RIGHT MAMMOGRAM WITH 3D TOMOSYNTHESIS WITH CAD
ULTRASOUND RIGHT BREAST

[R MLO (1 of 3)]
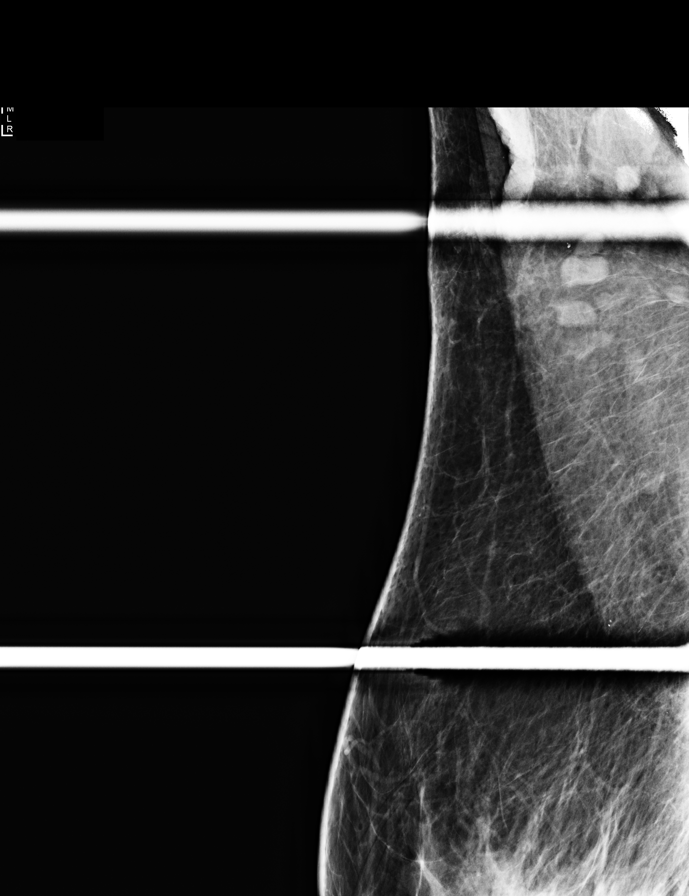

[R MLO (2 of 3)]
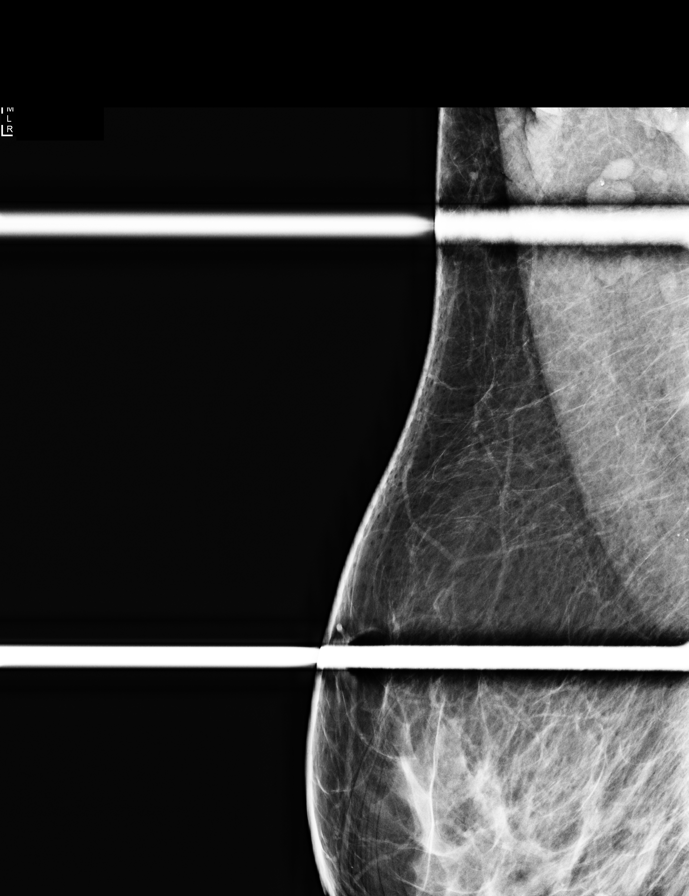

[R MLO (3 of 3)]
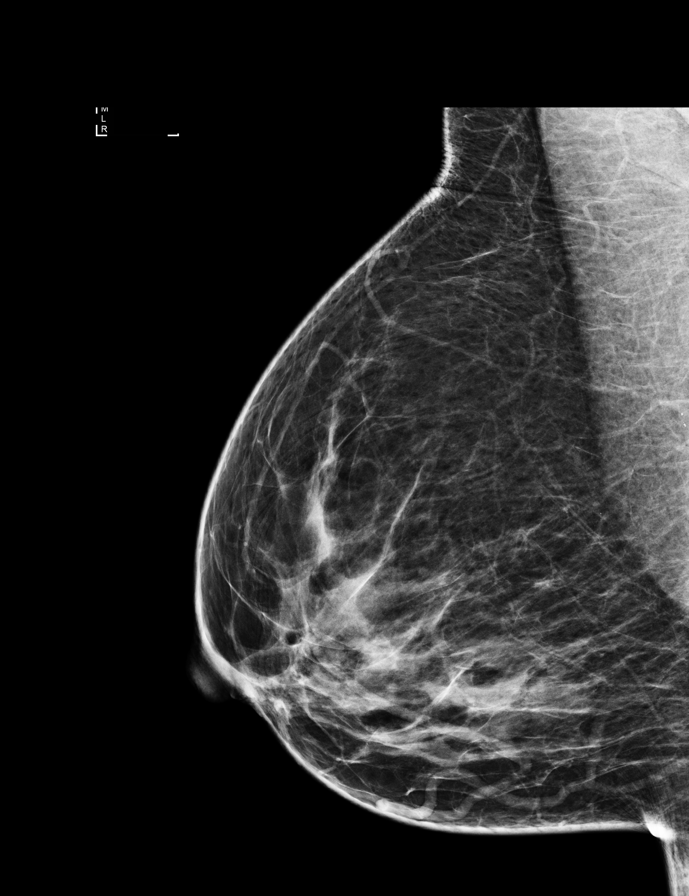

[R CC]
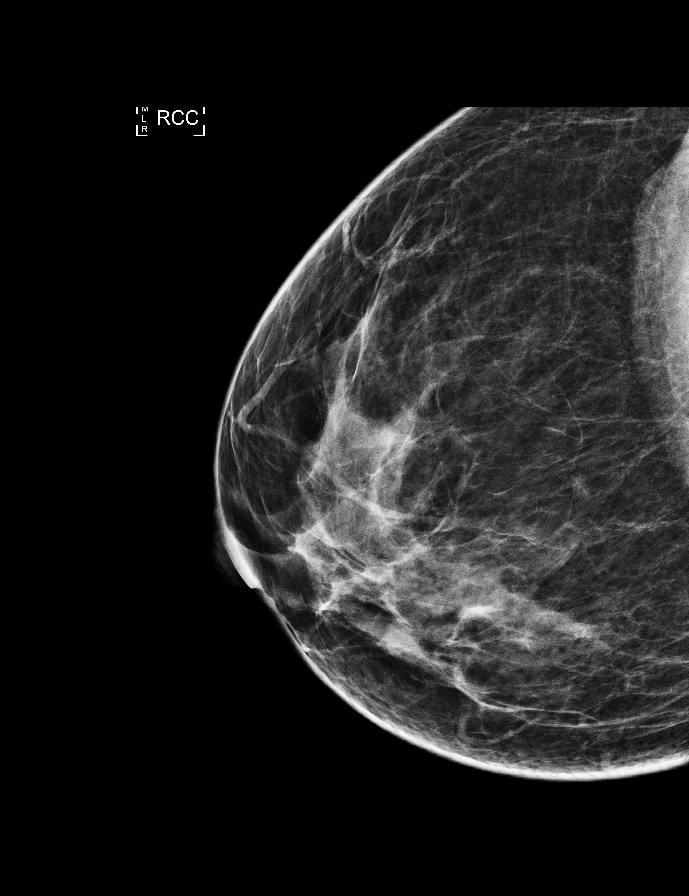

[R MLO synth-2D]
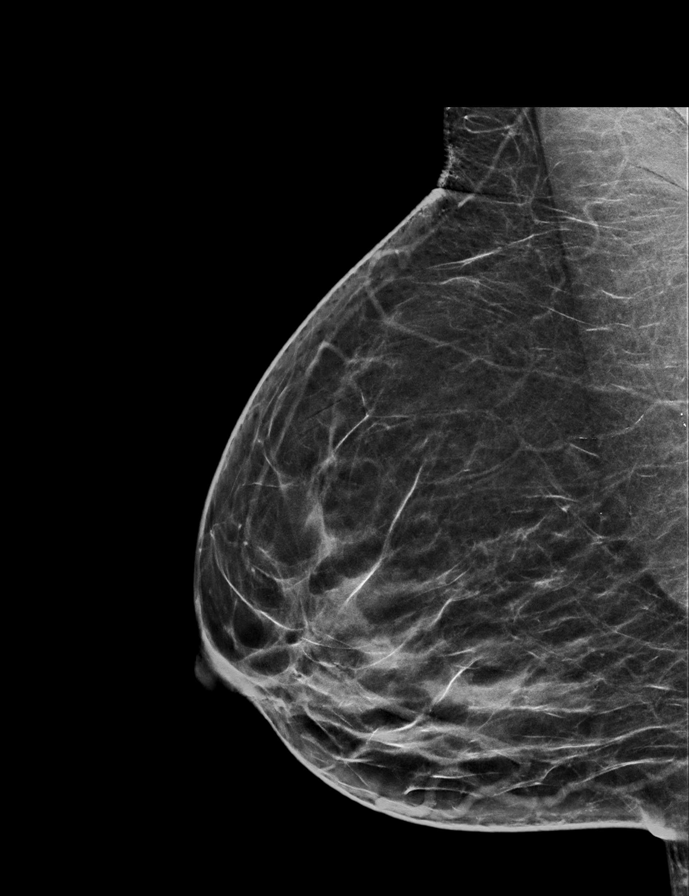

[R CC synth-2D]
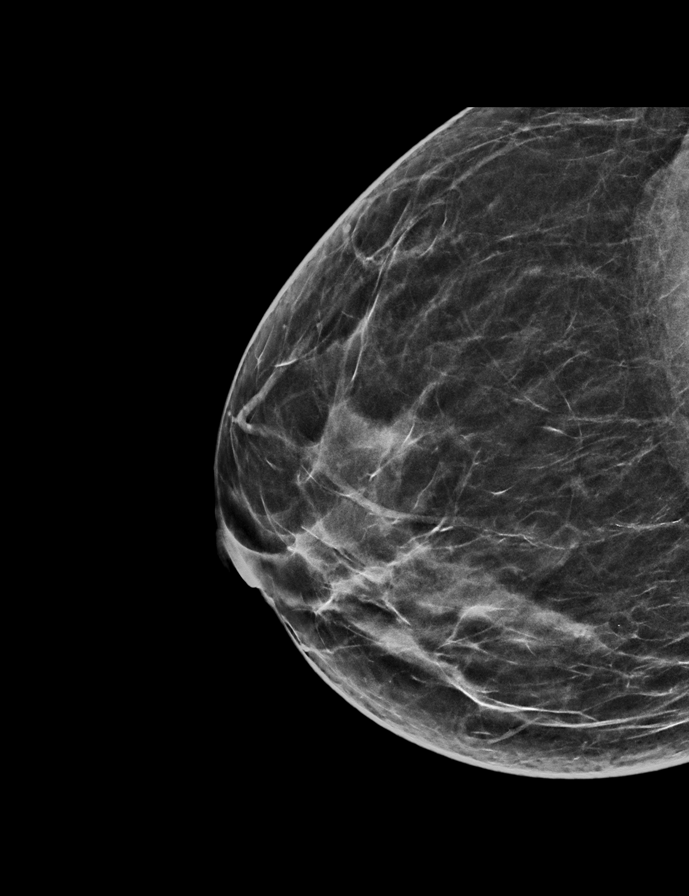

[R CC tomo · tomo slice 27/54.0]
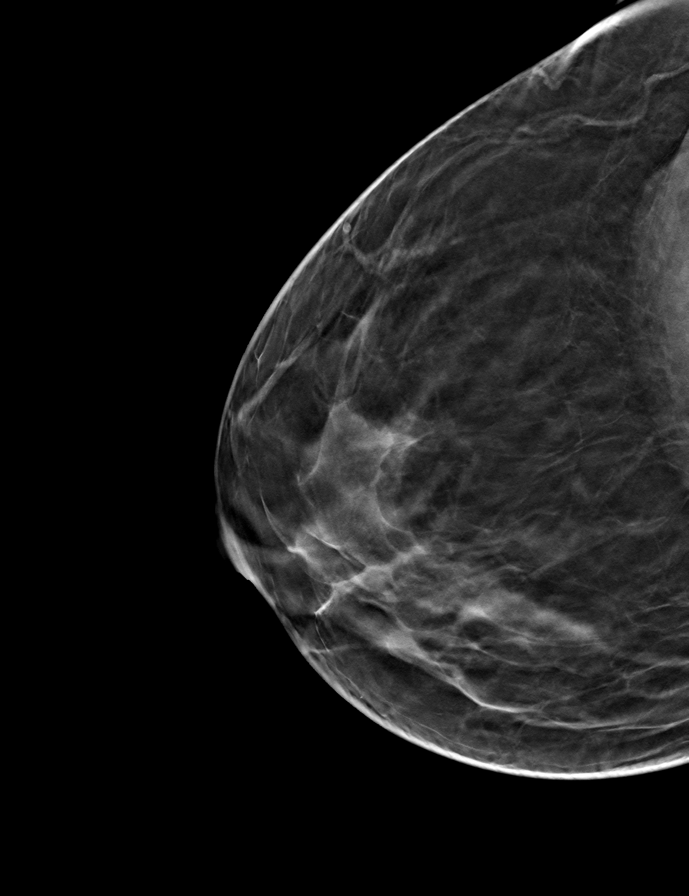

[R MLO tomo · tomo slice 31/60.0]
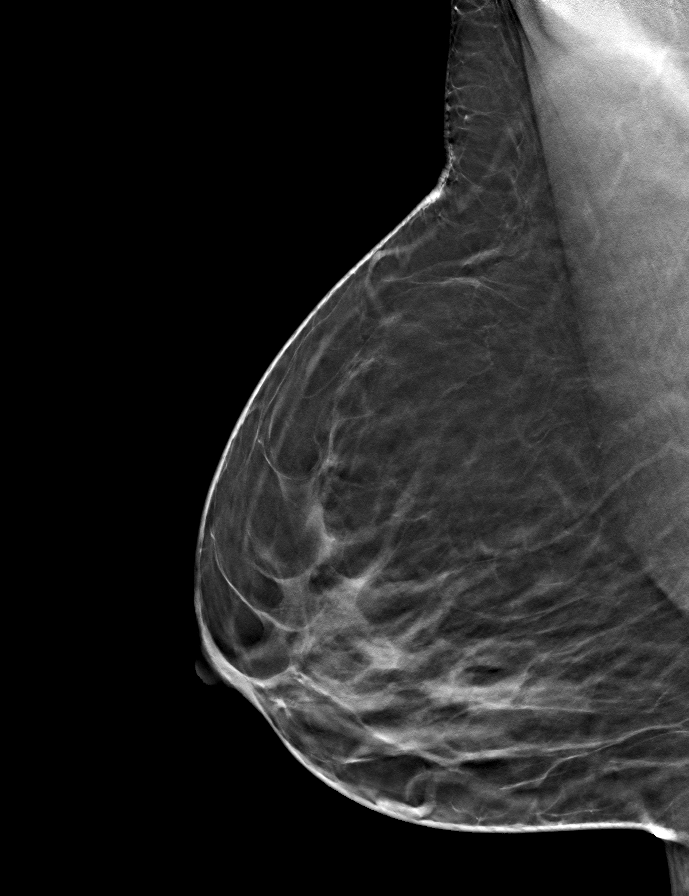

[8 of 16 positions shown; findings below may reference images not displayed]

ACR Breast Density Category b: There are scattered areas of
fibroglandular density.
FINDINGS: The right breast parenchymal pattern is stable. There is no mass,
distortion, or worrisome calcification. The previously seen
prominent right axillary lymph nodes have decreased in size
consistent with resolving reactive adenopathy.

Mammographic images were processed with CAD.

On physical exam, there is no discrete palpable abnormality.

Targeted ultrasound is performed, showing decrease in size of the
previously seen diffusely enlarged right axillary lymph node. This
now measures 2.0 x 0.8 cm in size and previously measured 2.7 x
cm in size.
IMPRESSION: Findings are consistent with resolving right axillary reactive
adenopathy. No findings worrisome for malignancy.

RECOMMENDATION:
Bilateral screening mammography in 6 months.

I have discussed the findings and recommendations with the patient.
Results were also provided in writing at the conclusion of the
visit. If applicable, a reminder letter will be sent to the patient
regarding the next appointment.

BI-RADS CATEGORY  2: Benign.

## 2017-06-16 DIAGNOSIS — H524 Presbyopia: Secondary | ICD-10-CM | POA: Diagnosis not present

## 2017-06-16 DIAGNOSIS — H5211 Myopia, right eye: Secondary | ICD-10-CM | POA: Diagnosis not present

## 2017-06-16 DIAGNOSIS — H52222 Regular astigmatism, left eye: Secondary | ICD-10-CM | POA: Diagnosis not present

## 2017-08-11 ENCOUNTER — Ambulatory Visit (INDEPENDENT_AMBULATORY_CARE_PROVIDER_SITE_OTHER): Payer: 59 | Admitting: Family Medicine

## 2017-08-11 ENCOUNTER — Other Ambulatory Visit (HOSPITAL_COMMUNITY)
Admission: RE | Admit: 2017-08-11 | Discharge: 2017-08-11 | Disposition: A | Discharge status: Home / Self Care | Payer: 59 | Source: Ambulatory Visit | Attending: Family Medicine | Admitting: Family Medicine

## 2017-08-11 ENCOUNTER — Encounter: Payer: Self-pay | Admitting: Family Medicine

## 2017-08-11 VITALS — BP 106/60 | HR 77 | Temp 99.0°F | Ht 62.5 in | Wt 148.4 lb

## 2017-08-11 DIAGNOSIS — Z01419 Encounter for gynecological examination (general) (routine) without abnormal findings: Secondary | ICD-10-CM

## 2017-08-11 DIAGNOSIS — Z0000 Encounter for general adult medical examination without abnormal findings: Secondary | ICD-10-CM | POA: Insufficient documentation

## 2017-08-11 LAB — CBC WITH DIFFERENTIAL/PLATELET
BASOS PCT: 0.5 % (ref 0.0–3.0)
Basophils Absolute: 0 10*3/uL (ref 0.0–0.1)
EOS PCT: 2.2 % (ref 0.0–5.0)
Eosinophils Absolute: 0.1 10*3/uL (ref 0.0–0.7)
HCT: 40.8 % (ref 36.0–46.0)
Hemoglobin: 13.7 g/dL (ref 12.0–15.0)
LYMPHS ABS: 3 10*3/uL (ref 0.7–4.0)
Lymphocytes Relative: 48.7 % — ABNORMAL HIGH (ref 12.0–46.0)
MCHC: 33.5 g/dL (ref 30.0–36.0)
MCV: 87.9 fl (ref 78.0–100.0)
MONO ABS: 0.4 10*3/uL (ref 0.1–1.0)
Monocytes Relative: 7.2 % (ref 3.0–12.0)
NEUTROS PCT: 41.4 % — AB (ref 43.0–77.0)
Neutro Abs: 2.6 10*3/uL (ref 1.4–7.7)
Platelets: 158 10*3/uL (ref 150.0–400.0)
RBC: 4.64 Mil/uL (ref 3.87–5.11)
RDW: 14.3 % (ref 11.5–15.5)
WBC: 6.2 10*3/uL (ref 4.0–10.5)

## 2017-08-11 LAB — COMPREHENSIVE METABOLIC PANEL
ALT: 26 U/L (ref 0–35)
AST: 22 U/L (ref 0–37)
Albumin: 4.6 g/dL (ref 3.5–5.2)
Alkaline Phosphatase: 74 U/L (ref 39–117)
BUN: 12 mg/dL (ref 6–23)
CHLORIDE: 103 meq/L (ref 96–112)
CO2: 31 meq/L (ref 19–32)
Calcium: 10 mg/dL (ref 8.4–10.5)
Creatinine, Ser: 0.8 mg/dL (ref 0.40–1.20)
GFR: 98.72 mL/min (ref >60.00)
GLUCOSE: 92 mg/dL (ref 70–99)
POTASSIUM: 3.8 meq/L (ref 3.5–5.1)
SODIUM: 142 meq/L (ref 135–145)
Total Bilirubin: 0.6 mg/dL (ref 0.2–1.2)
Total Protein: 7.7 g/dL (ref 6.0–8.3)

## 2017-08-11 LAB — LIPID PANEL
Cholesterol: 169 mg/dL (ref 0–200)
HDL: 68.9 mg/dL (ref >39.00)
LDL CALC: 92 mg/dL (ref 0–99)
NONHDL: 100.43
Total CHOL/HDL Ratio: 2
Triglycerides: 42 mg/dL (ref 0.0–149.0)
VLDL: 8.4 mg/dL (ref 0.0–40.0)

## 2017-08-11 LAB — TSH: TSH: 1.54 u[IU]/mL (ref 0.35–4.50)

## 2017-08-11 MED ORDER — MELOXICAM 15 MG PO TABS: 15.0000 mg | ORAL_TABLET | Freq: Every day | ORAL | 11 refills | Status: DC

## 2017-08-11 NOTE — Assessment & Plan Note (Signed)
Reviewed preventive care protocols, scheduled due services, and updated immunizations Discussed nutrition, exercise, diet, and healthy lifestyle.  Discussed USPSTF recommendations of cervical cancer screening.  She is aware that interval of 3 years is recommended but pt would prefer to have pap smear done today.   Orders Placed This Encounter  Procedures  . CBC with Differential/Platelet  . Comprehensive metabolic panel  . Lipid panel  . TSH  . HIV antibody (with reflex)

## 2017-08-11 NOTE — Patient Instructions (Signed)
Great to see you. We will call you with your results from today and you can view them online.  Please make an appointment with Dr. Raeford Razor on your way out.

## 2017-08-11 NOTE — Progress Notes (Signed)
Subjective:   Patient ID: Jennifer Ware, female    DOB: [birth year], 46 y.o.   MRN: [id number]  Jennifer Ware is a pleasant 46 y.o. year old female who presents to clinic today with Annual Exam (Patient is here today for a CPE with PAP.  She is currently fasting.)  on 08/11/2017  HPI:   Doing well. Has no complaints.  Pap smear -  07/2016.  She does want pap done today. Mammogram 01/2017   Lab Results  Component Value Date   CHOL 149 08/07/2016   HDL 60.90 08/07/2016   LDLCALC 81 08/07/2016   TRIG 35.0 08/07/2016   CHOLHDL 2 08/07/2016   Lab Results  Component Value Date   CREATININE 0.85 08/07/2016   Lab Results  Component Value Date   TSH 1.02 08/07/2016   Lab Results  Component Value Date   NA 138 08/07/2016   K 4.0 08/07/2016   CL 103 08/07/2016   CO2 30 08/07/2016   Lab Results  Component Value Date   WBC 3.2 (L) 08/07/2016   HGB 13.8 08/07/2016   HCT 40.5 08/07/2016   MCV 85.2 08/07/2016   PLT 115.0 (L) 08/07/2016   Current Outpatient Medications on File Prior to Visit  Medication Sig Dispense Refill  . Ascorbic Acid (VITAMIN C) 1000 MG tablet Take 1,000 mg by mouth daily.    Marland Kitchen BIOTIN 5000 PO Take by mouth daily.    . Calcium 600-200 MG-UNIT per tablet Take 2 tablets by mouth daily.    . Cranberry 1000 MG CAPS Take 500 mg by mouth as needed.     . cyanocobalamin 1000 MCG tablet Take 100 mcg by mouth daily.    . Fluocinolone Acetonide 0.01 % OIL 2 drops to affected ear, No more than 1 week at a time 1 Bottle 3  . Ginkgo Biloba 40 MG TABS Take 1 tablet by mouth daily.    Marland Kitchen glucosamine-chondroitin 500-400 MG tablet Take 1 tablet by mouth 2 (two) times daily.    . Lecithin 1200 MG CAPS Take 1 capsule by mouth as needed.     . Methyl Salicylate 25 % CREA Apply to affected areas three times daily as needed 1 Tube 3  . Multiple Vitamins-Minerals (ANTIOXIDANT PO) Take by mouth daily.     No current facility-administered medications on file prior to visit.       Allergies  Allergen Reactions  . Flagyl [Metronidazole]   . Sulfa Antibiotics     Past Medical History:  Diagnosis Date  . Anxiety   . GERD (gastroesophageal reflux disease)   . Heart murmur   . Hx gestational diabetes   . Thyroid disease     Past Surgical History:  Procedure Laterality Date  . BREAST BIOPSY Right 08/24/2014   negative, u/s core  . CESAREAN SECTION     x3  . HERNIA REPAIR     umbilical  . UMBILICAL HERNIA REPAIR      Family History  Problem Relation Age of Onset  . Asthma Father   . Colon cancer Neg Hx   . Breast cancer Neg Hx     Social History   Socioeconomic History  . Marital status: Married    Spouse name: Not on file  . Number of children: 3  . Years of education: Not on file  . Highest education level: Not on file  Social Needs  . Financial resource strain: Not on file  . Food insecurity - worry: Not on file  .  Food insecurity - inability: Not on file  . Transportation needs - medical: Not on file  . Transportation needs - non-medical: Not on file  Occupational History  . Occupation: LPN    Employer: [employer]  Tobacco Use  . Smoking status: Never Smoker  . Smokeless tobacco: Never Used  Substance and Sexual Activity  . Alcohol use: No  . Drug use: No  . Sexual activity: Yes    Partners: Male    Birth control/protection: None  Other Topics Concern  . Not on file  Social History Narrative   Originally from [place].   Has lived in area for 14 years.   3 children.   Works as a Marine scientist for [employer].     The PMH, PSH, Social History, Family History, Medications, and allergies have been reviewed in [facility], and have been updated if relevant.   Review of Systems  Constitutional: Negative for appetite change, chills, fatigue, fever and unexpected weight change.  HENT: Negative.   Eyes: Negative.   Respiratory: Negative.   Cardiovascular: Negative for chest pain, palpitations and leg swelling.  Gastrointestinal: Negative.    Endocrine: Negative for cold intolerance, heat intolerance, polydipsia, polyphagia and polyuria.  Genitourinary: Negative.   Musculoskeletal: Negative for myalgias, neck pain and neck stiffness.  Skin: Negative.   Neurological: Negative.   Hematological: Negative.   Psychiatric/Behavioral: Negative.   All other systems reviewed and are negative.      Objective:    BP 106/60 (BP Location: Left Arm, Patient Position: Sitting, Cuff Size: Normal)   Pulse 77   Temp 99 F (37.2 C) (Oral)   Ht 5' 2.5" (1.588 m)   Wt 148 lb 6.4 oz (67.3 kg)   LMP 06/27/2017   SpO2 98%   BMI 26.71 kg/m  Wt Readings from Last 3 Encounters:  08/11/17 148 lb 6.4 oz (67.3 kg)  08/07/16 146 lb (66.2 kg)  06/26/16 145 lb 12 oz (66.1 kg)    General:  Well-developed,well-nourished,in no acute distress; alert,appropriate and cooperative throughout examination Head:  normocephalic and atraumatic.   Eyes:  vision grossly intact, PERRL Ears:  R ear normal and L ear normal externally, TMs clear bilaterally Nose:  no external deformity.   Mouth:  good dentition.   Neck:  No deformities, masses, or tenderness noted. Breasts:  No mass, nodules, thickening, tenderness, bulging, retraction, inflamation, nipple discharge or skin changes noted.   Lungs:  Normal respiratory effort, chest expands symmetrically. Lungs are clear to auscultation, no crackles or wheezes. Heart:  Normal rate and regular rhythm. S1 and S2 normal without gallop, murmur, click, rub or other extra sounds. Abdomen:  Bowel sounds positive,abdomen soft and non-tender without masses, organomegaly or hernias noted. Rectal:  no external abnormalities.   Genitalia:  Pelvic Exam:        External: normal female genitalia without lesions or masses        Vagina: normal without lesions or masses        Cervix: normal without lesions or masses        Adnexa: normal bimanual exam without masses or fullness        Uterus: normal by palpation        Pap  smear: performed Msk:  No deformity or scoliosis noted of thoracic or lumbar spine.   Extremities:  No clubbing, cyanosis, edema, or deformity noted with normal full range of motion of all joints.   Neurologic:  alert & oriented X3 and gait normal.   Skin:  Intact without suspicious lesions or rashes Cervical Nodes:  No lymphadenopathy noted Axillary Nodes:  No palpable lymphadenopathy Psych:  Cognition and judgment appear intact. Alert and cooperative with normal attention span and concentration. No apparent delusions, illusions, hallucinations          Assessment & Plan:   Well woman exam with routine gynecological exam - Plan: Cytology - PAP, CBC with Differential/Platelet, Comprehensive metabolic panel, Lipid panel, TSH, HIV antibody (with reflex) No Follow-up on file.

## 2017-08-12 LAB — HIV ANTIBODY (ROUTINE TESTING W REFLEX): HIV 1&2 Ab, 4th Generation: NONREACTIVE

## 2017-08-14 LAB — CYTOLOGY - PAP
Bacterial vaginitis: NEGATIVE
CHLAMYDIA, DNA PROBE: NEGATIVE
Candida vaginitis: NEGATIVE
DIAGNOSIS: NEGATIVE
HPV: NOT DETECTED
NEISSERIA GONORRHEA: NEGATIVE
TRICH (WINDOWPATH): NEGATIVE

## 2017-08-18 ENCOUNTER — Telehealth: Payer: Self-pay | Admitting: Family Medicine

## 2017-08-18 LAB — CERVICOVAGINAL ANCILLARY ONLY: HERPES (WINDOWPATH): NEGATIVE

## 2017-08-18 NOTE — Telephone Encounter (Unsigned)
Copied from [place] [phone]. Topic: Quick Communication - See Telephone Encounter >> Aug 18, 2017  2:43 PM Boyd Kerbs wrote: CRM for notification. See Telephone encounter for:   Patient is asking for referral for Ultra Sound.   08/18/17.

## 2017-08-19 NOTE — Telephone Encounter (Signed)
LMOVM stating that TA advised to schedule OV with Dr. Raeford Razor and to plz call the office to do so/thx dmf

## 2017-08-19 NOTE — Telephone Encounter (Signed)
TA-Pt called asking about a referral for an U/S? I don't see any mention about it in the CPE/Do you know what she is referring to? Plz advise/thx dmf

## 2017-08-19 NOTE — Telephone Encounter (Signed)
She asked me if I do ultrasounds to look at MSK issues.  I advised her to make an appointment to see Dr. Raeford Razor.

## 2017-08-22 ENCOUNTER — Ambulatory Visit: Payer: 59 | Admitting: Family Medicine

## 2017-08-25 ENCOUNTER — Ambulatory Visit: Payer: 59 | Admitting: Family Medicine

## 2017-09-01 ENCOUNTER — Encounter: Payer: Self-pay | Admitting: Family Medicine

## 2017-09-01 ENCOUNTER — Ambulatory Visit (INDEPENDENT_AMBULATORY_CARE_PROVIDER_SITE_OTHER): Payer: Self-pay

## 2017-09-01 ENCOUNTER — Ambulatory Visit (INDEPENDENT_AMBULATORY_CARE_PROVIDER_SITE_OTHER): Payer: Self-pay | Admitting: Family Medicine

## 2017-09-01 VITALS — BP 122/68 | HR 68 | Temp 98.2°F | Ht 62.5 in | Wt 138.0 lb

## 2017-09-01 DIAGNOSIS — M533 Sacrococcygeal disorders, not elsewhere classified: Secondary | ICD-10-CM | POA: Insufficient documentation

## 2017-09-01 DIAGNOSIS — M5432 Sciatica, left side: Secondary | ICD-10-CM

## 2017-09-01 HISTORY — DX: Sacrococcygeal disorders, not elsewhere classified: M53.3

## 2017-09-01 HISTORY — DX: Sciatica, left side: M54.32

## 2017-09-01 MED ORDER — DICLOFENAC SODIUM 2 % TD SOLN
1.0000 | Freq: Two times a day (BID) | TRANSDERMAL | 3 refills | Status: DC
Start: 2017-09-01 — End: 2019-08-16

## 2017-09-01 MED ORDER — AMBULATORY NON FORMULARY MEDICATION: 0 refills | Status: DC

## 2017-09-01 NOTE — Progress Notes (Signed)
Jennifer Ware - 47 y.o. female MRN [id number]  Date of birth: [birth year]  SUBJECTIVE:  Including CC & ROS.  Chief Complaint  Patient presents with  . back and leg pain    Jennifer Ware is a 47 y.o. female that is presenting with lower back pain, left sided leg pain and concern for DVT.  Pain in the lower back and in her left leg has been chronic in nature. Pain is located near tailbone and radiates down her left lower leg. Denies tingling or numbness.   Pain is described as a throbbing pain. Denies injury or surgery. Sitting or standing for long periods trigger the pain. She has takes Mobic for the pain and has noticed some improvement. Pain is moderate to severe. Has not performed physical therapy.   She has concern for DVT in both of her legs since she notices that her veins show while she is working. Denies any history of blood clot. Has traveled to [place] over the holidays but had this pain prior to that. She denies any SOB or palpitations.     Review of Systems  Constitutional: Negative for fever.  Respiratory: Negative for shortness of breath.   Cardiovascular: Negative for chest pain.  Gastrointestinal: Negative for abdominal pain.  Musculoskeletal: Positive for back pain. Negative for gait problem.  Skin: Negative for color change.  Neurological: Negative for weakness.  Hematological: Negative for adenopathy.  Psychiatric/Behavioral: Negative for agitation.    HISTORY: Past Medical, Surgical, Social, and Family History Reviewed & Updated per EMR.   Pertinent Historical Findings include:  Past Medical History:  Diagnosis Date  . Anxiety   . GERD (gastroesophageal reflux disease)   . Heart murmur   . Hx gestational diabetes   . Thyroid disease     Past Surgical History:  Procedure Laterality Date  . BREAST BIOPSY Right 08/24/2014   negative, u/s core  . CESAREAN SECTION     x3  . HERNIA REPAIR     umbilical  . UMBILICAL HERNIA REPAIR      Allergies  Allergen  Reactions  . Flagyl [Metronidazole]   . Sulfa Antibiotics     Family History  Problem Relation Age of Onset  . Asthma Father   . Colon cancer Neg Hx   . Breast cancer Neg Hx      Social History   Socioeconomic History  . Marital status: Married    Spouse name: Not on file  . Number of children: 3  . Years of education: Not on file  . Highest education level: Not on file  Social Needs  . Financial resource strain: Not on file  . Food insecurity - worry: Not on file  . Food insecurity - inability: Not on file  . Transportation needs - medical: Not on file  . Transportation needs - non-medical: Not on file  Occupational History  . Occupation: LPN    Employer: [employer]  Tobacco Use  . Smoking status: Never Smoker  . Smokeless tobacco: Never Used  Substance and Sexual Activity  . Alcohol use: No  . Drug use: No  . Sexual activity: Yes    Partners: Male    Birth control/protection: None  Other Topics Concern  . Not on file  Social History Narrative   Originally from [place].   Has lived in area for 14 years.   3 children.   Works as a Marine scientist for [employer].       PHYSICAL EXAM:  VS: BP 122/68 (BP Location:  Left Arm, Patient Position: Sitting, Cuff Size: Normal)   Pulse 68   Temp 98.2 F (36.8 C) (Oral)   Ht 5' 2.5" (1.588 m)   Wt 138 lb (62.6 kg)   SpO2 100%   BMI 24.84 kg/m  Physical Exam Gen: NAD, alert, cooperative with exam, well-appearing ENT: normal lips, normal nasal mucosa,  Eye: normal EOM, normal conjunctiva and lids CV:  no edema, +2 pedal pulses   Resp: no accessory muscle use, non-labored,  Skin: no rashes, no areas of induration, no redness  Neuro: normal tone, normal sensation to touch Psych:  normal insight, alert and oriented MSK: no unilateral redness of LE swelling.  Back:  No TTP of the lumbar spine, paraspinal muscles  Mild TTP of the left SI joint  Normal IR and ER of the hips  Normal hip flexion strength to resistance  Normal  knee flexion and extension  Normal dorsi and plantarflexion  Positive SLR on the right  FADIR and FABER normal on right  FADIR normal on right  FABER with significant pain  Neurovascularly intact   Limited ultrasound: SI joint:  No abnormal tissue movement or muscle structure   Summary: normal exam   Ultrasound and interpretation by Clearance Coots, MD       ASSESSMENT & PLAN:   Sciatica of left side Pain in her leg seems to be related to sciatic type symptoms. She has had this pain for several years. No signs of a DVT on exam.  - Lumbar films  - she can continue mobic  - counseled and discussed gabapentin. She declined at this time  - MRI lumbar spine to evaluate nerve impingement  - compression stockings.   SI (sacroiliac) joint dysfunction Seems to have a component of SI joint pain on the left side. This may be the reason why she has sciatic type symptoms on the left.  - Pennsaid  - If no improvement would consider an SI joint injection

## 2017-09-01 NOTE — Assessment & Plan Note (Signed)
Seems to have a component of SI joint pain on the left side. This may be the reason why she has sciatic type symptoms on the left.  - Pennsaid  - If no improvement would consider an SI joint injection

## 2017-09-01 NOTE — Patient Instructions (Signed)
Please try the exercises  Please let me know if you don't have improvement of your pain and we could consider an injection  Glucosamine sulfate 750mg  twice a day is a supplement that has been shown to help Fish oil 2 grams daily.  Tumeric 500mg  twice daily.  Capsaicin topically up to four times a day may also help with pain.

## 2017-09-01 NOTE — Assessment & Plan Note (Addendum)
Pain in her leg seems to be related to sciatic type symptoms. She has had this pain for several years. No signs of a DVT on exam.  - Lumbar films  - she can continue mobic  - counseled and discussed gabapentin. She declined at this time  - MRI lumbar spine to evaluate nerve impingement  - compression stockings.

## 2017-09-09 ENCOUNTER — Other Ambulatory Visit: Payer: Self-pay | Admitting: Family Medicine

## 2017-09-11 ENCOUNTER — Ambulatory Visit
Admission: RE | Admit: 2017-09-11 | Discharge: 2017-09-11 | Disposition: A | Discharge status: Home / Self Care | Payer: No Typology Code available for payment source | Source: Ambulatory Visit | Attending: Family Medicine | Admitting: Family Medicine

## 2017-09-11 DIAGNOSIS — M5432 Sciatica, left side: Secondary | ICD-10-CM

## 2017-09-22 ENCOUNTER — Other Ambulatory Visit: Payer: Self-pay | Admitting: Family Medicine

## 2017-09-22 DIAGNOSIS — M5432 Sciatica, left side: Secondary | ICD-10-CM

## 2017-09-22 NOTE — Progress Notes (Signed)
Placed referral to physical therapy.  Rosemarie Ax, MD [facility] 09/22/2017, 2:24 PM

## 2017-10-28 ENCOUNTER — Ambulatory Visit: Payer: No Typology Code available for payment source | Admitting: Physical Therapy

## 2017-11-04 ENCOUNTER — Ambulatory Visit: Payer: No Typology Code available for payment source | Admitting: Physical Therapy

## 2017-11-11 ENCOUNTER — Ambulatory Visit: Payer: No Typology Code available for payment source | Admitting: Physical Therapy

## 2017-11-18 ENCOUNTER — Ambulatory Visit: Payer: No Typology Code available for payment source | Admitting: Physical Therapy

## 2017-11-25 ENCOUNTER — Encounter: Payer: No Typology Code available for payment source | Admitting: Physical Therapy

## 2018-01-06 ENCOUNTER — Other Ambulatory Visit: Payer: Self-pay | Admitting: Family Medicine

## 2018-01-06 DIAGNOSIS — Z1231 Encounter for screening mammogram for malignant neoplasm of breast: Secondary | ICD-10-CM

## 2018-01-22 ENCOUNTER — Ambulatory Visit
Admission: RE | Admit: 2018-01-22 | Discharge: 2018-01-22 | Disposition: A | Discharge status: Home / Self Care | Payer: No Typology Code available for payment source | Source: Ambulatory Visit | Attending: Family Medicine | Admitting: Family Medicine

## 2018-01-22 DIAGNOSIS — Z1231 Encounter for screening mammogram for malignant neoplasm of breast: Secondary | ICD-10-CM | POA: Diagnosis present

## 2018-01-26 ENCOUNTER — Ambulatory Visit: Payer: No Typology Code available for payment source | Attending: Family Medicine | Admitting: Physical Therapy

## 2018-01-26 ENCOUNTER — Encounter: Payer: Self-pay | Admitting: Physical Therapy

## 2018-01-26 ENCOUNTER — Other Ambulatory Visit: Payer: Self-pay

## 2018-01-26 DIAGNOSIS — M6281 Muscle weakness (generalized): Secondary | ICD-10-CM | POA: Diagnosis present

## 2018-01-26 DIAGNOSIS — G8929 Other chronic pain: Secondary | ICD-10-CM | POA: Diagnosis present

## 2018-01-26 DIAGNOSIS — M5442 Lumbago with sciatica, left side: Secondary | ICD-10-CM | POA: Diagnosis not present

## 2018-01-26 NOTE — Patient Instructions (Signed)
Provided written HEP of lumbar extension: standing and prone press ups from [facility];

## 2018-01-26 NOTE — Therapy (Signed)
[place] [facility]IN [facility] SERVICES [address] [place], [place], [address] Phone: [phone]   Fax:  [phone]  Physical Therapy Evaluation  Patient Details  Name: Jennifer Ware MRN: [id number] Date of Birth: [birth year] Referring Provider: Dr. Raeford Razor   Encounter Date: 01/26/2018  PT End of Session - 01/26/18 0901    Visit Number  1    Number of Visits  9    Date for PT Re-Evaluation  03/23/18    Authorization Type  visit: 1    PT Start Time  0805    PT Stop Time  0855    PT Time Calculation (min)  50 min    Activity Tolerance  Patient tolerated treatment well;No increased pain    Behavior During Therapy  WFL for tasks assessed/performed       Past Medical History:  Diagnosis Date  . Anxiety   . GERD (gastroesophageal reflux disease)   . Heart murmur   . Hx gestational diabetes   . Thyroid disease     Past Surgical History:  Procedure Laterality Date  . BREAST BIOPSY Right 08/24/2014   negative, u/s core  . CESAREAN SECTION     x3  . HERNIA REPAIR     umbilical  . UMBILICAL HERNIA REPAIR      There were no vitals filed for this visit.   Subjective Assessment - 01/26/18 0811    Subjective  "I am having low back pain that radiates into my left thigh."     Pertinent History  47  yo Female reports low back pain that radiates into LLE that has been going on 6-12 months. 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     Limitations  Standing;Walking    How long can you sit comfortably?  NA    How long can you stand comfortably?  as long as needed;     How long can you walk comfortably?  1 mile- with increased pain;     Diagnostic tests  MRI in Feb 2019: L4-5  left central/subarticular disc protrusion with impingement to L5 nerve root;     Patient Stated Goals  to reduce pain;     Currently in Pain?  Yes    Pain Score  3     Pain Location  Back    Pain Orientation  Left    Pain Descriptors / Indicators  Burning;Shooting;Sharp    Pain Type  Chronic pain    Pain Radiating Towards  radiates down LLE to thigh    Pain Onset  More than a month ago    Pain Frequency  Intermittent    Aggravating Factors   standing/walking or sitting for long time; some food will increase her pain (including soda).     Pain Relieving Factors  exercise, takes vitamin (glucosamine, fish oil,) takes mobic sparingly;     Effect of Pain on Daily Activities  pushes through pain;          [facility] PT Assessment - 01/26/18 0001      Assessment   Medical Diagnosis  LLE sciatica    Referring Provider  Dr. Raeford Razor    Onset Date/Surgical Date  -- 6-12 months ago  Hand Dominance  Right    Next MD Visit  not scheduled    Prior Therapy  denies any PT for this condition;       Precautions   Precautions  None      Restrictions   Weight Bearing Restrictions  No      Balance Screen   Has the patient fallen in the past 6 months  No    Has the patient had a decrease in activity level because of a fear of falling?   No    Is the patient reluctant to leave their home because of a fear of falling?   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[employer], does standing, lifting;     Leisure  enjoy time with kids, listening to music; watch TV;       Cognition   Overall Cognitive Status  Within Functional Limits for tasks assessed      Observation/Other Assessments   Modified Oswertry   22% (moderate disability)      Sensation   Light Touch  Appears Intact      Coordination   Gross Motor Movements are Fluid and Coordinated  Yes    Fine Motor Movements are Fluid and Coordinated  Yes      Posture/Postural Control   Posture Comments  exhibits erect posture , equal hip height bilaterally; exhibits increased lumbar lordosis in standing; mild tightness noted on left quadratus lumborum;       ROM / Strength   AROM / PROM / Strength  AROM;Strength      AROM   Overall AROM Comments  BUE and BLE are [facility]    Lumbar Flexion  50    Lumbar Extension  20    Lumbar - Right Side Bend  25 tightness noted on LLE    Lumbar - Left Side Bend  5 with increased pain;     Lumbar - Right Rotation  -- WFL    Lumbar - Left Rotation  -- [facility]      Strength   Overall Strength Comments  BLE grossly 4/5, no weakness noted in LLE;       Palpation   Spinal mobility  hypomobile with moderate discomfort noted at L2, L3, L4, L5; increased pain with prone on elbows; increased guarding noted with spring tests at L3, L4, L5; reports less discomfort with left lumbar distraction;     Palpation comment  increased tightness noted with left QL; reduce multifidi tone and firing on left side; increased pain with left hip extension;       FABER test   findings  Negative    Side  LEft    Comment  negative right      Slump test   Findings  Positive    Side  Left      Prone Knee Bend Test   Findings  Positive    Side  Left      Straight Leg Raise   Findings  Positive    Side   Left    Comment  -- at 35 degrees flexion;       Pelvic Dictraction  Findings  Negative    Side   Left      Pelvic Compression   Findings  Negative    Side  Left      Transfers   Comments  able to transfer independently;       Ambulation/Gait   Gait Comments  ambulates with good reciprocal gait patttern, good speed, normal step length, good foot clearance, does exhibits increased lumbar lordosis;       Standardized  Balance Assessment   10 Meter Walk  1.2 m/s without AD, community ambulator, low fall risk;       High Level Balance   High Level Balance Comments  exhibits good standing balance;         TREATMENT: Instructed patient in standing lumbar extension standing with feet apart and semi-tandem to facilitate better lumbar extension ROM;  Educated patient in prone press ups in prone to also improve lumbar extension ROM for better centralization of symptoms;  Patient educated to avoid flexion ROM to reduce exacerbation of disc bulge;          Objective measurements completed on examination: See above findings.              PT Education - 01/26/18 0901    Education Details  HEP instructed, educated on posture and POC    Person(s) Educated  Patient    Methods  Explanation;Verbal cues;Handout    Comprehension  Verbalized understanding;Returned demonstration;Verbal cues required;Need further instruction       PT Short Term Goals - 01/26/18 0936      PT SHORT TERM GOAL #1   Title  Patient will be adherent to HEP at least 3x a week to improve functional strength and balance for better safety at home.    Time  4    Period  Weeks    Status  New    Target Date  02/23/18      PT SHORT TERM GOAL #2   Title  Patient will reduce modified Oswestry score to <20 as to demonstrate minimal disability with ADLs including improved sleeping tolerance, walking/sitting tolerance etc for better mobility with ADLs.     Time  4    Period  Weeks    Status  New    Target Date  02/23/18      PT SHORT TERM GOAL #3   Title  Patient will improve lumbar AROM: lateral flexion >15 degrees bilaterally for better functional ROM for bending over to pick stuff up off the floor.     Time  4    Period  Weeks    Status  New    Target Date  02/23/18        PT Long Term Goals - 01/26/18 0945      PT LONG TERM GOAL #1   Title  Patient will report a worst pain of 3/10 on VAS in    low back with no LLE  pain       to improve tolerance with ADLs and reduced symptoms with activities.     Time  8    Period  Weeks    Status  New    Target Date  03/23/18      PT LONG TERM GOAL #2   Title  patient will be able to work a full shift at work without increased in LLE pain to exhibit improved tolerance at work.     Time  8    Period  Weeks  Status  New    Target Date  03/23/18      PT LONG TERM GOAL #3   Title  Patient will improve multifidi activation in lumbar spine as evidenced by UE/LE lift to improve lumbar stabilization with daily tasks for less back pain.     Time  8    Period  Weeks    Status  New    Target Date  03/23/18             Plan - 01/26/18 0902    Clinical Impression Statement  47 yo Female presents to therapy with low back pain with LLE radiculopathy; MRI in Feb 2019 shows moderate disc bulge on left side at L4-L5 with L5 nerve root impingement. Patient tested positive for nerve root impingement with Slump test, SLR, prone knee bend test. She exhibits functional lumbar ROM but does have discomfort with lateral flexion. Patient exhibits increased lumbar lordosis in posture. She exhibits moderate tightness to left QL with significant guarding during palpation and joint mobilization. She exhibits hypomobility with PA mobs at L2, L3, L4, and L5; Patient reports less back pain with gentle left side lumbar distraction. She tests as a low fall risk and exhibits good LE strength. She does exhibit weakness in lumbar paraspinals with guarding during UE/LE movement. She would benefit from skilled PT intervention to improve lumbar ROM, reduce back pain and improve mobility.    History and Personal Factors relevant to plan of care:  chronic pain that radiates into LLE, young, motivated, working full-time, pushes through pain, lives in 2 story home with steps to enter, has anxiety    Clinical Presentation  Evolving    Clinical Presentation due to:  back pain that radiates into LLE which  has worsened over last few months;     Clinical Decision Making  Moderate    Rehab Potential  Good    Clinical Impairments Affecting Rehab Potential  motivated, young in age    PT Frequency  1x / week    PT Duration  8 weeks    PT Treatment/Interventions  ADLs/Self Care Home Management;Cryotherapy;Electrical Stimulation;Moist Heat;Traction;Therapeutic exercise;Therapeutic activities;Functional mobility training;Patient/family education    PT Next Visit Plan  advance HEP, try traction, lumbar extension exercise    PT Home Exercise Plan  initiated;     Consulted and Agree with Plan of Care  Patient       Patient will benefit from skilled therapeutic intervention in order to improve the following deficits and impairments:  Decreased endurance, Hypomobility, Decreased activity tolerance, Decreased strength, Pain, Improper body mechanics  Visit Diagnosis: Chronic left-sided low back pain with left-sided sciatica  Muscle weakness (generalized)     Problem List Patient Active Problem List   Diagnosis Date Noted  . SI (sacroiliac) joint dysfunction 09/01/2017  . Sciatica of left side 09/01/2017  . Well woman exam with routine gynecological exam 08/11/2017  . Osteoarthritis of both knees 08/07/2016  . Gastroesophageal reflux disease 08/07/2016  . Chronic UTI 12/24/2011    Trotter,Margaret PT, DPT 01/26/2018, 9:55 AM  [place] [facility]IN [facility] SERVICES [address] [place], [place], [address] Phone: [phone]   Fax:  [phone]  Name: Jennifer Ware MRN: [id number] Date of Birth: [birth year]

## 2018-01-28 ENCOUNTER — Encounter: Payer: No Typology Code available for payment source | Admitting: Physical Therapy

## 2018-01-29 ENCOUNTER — Telehealth: Payer: Self-pay | Admitting: Family Medicine

## 2018-01-29 NOTE — Telephone Encounter (Signed)
Copied from [place] [id number]. Topic: Quick Communication - See Telephone Encounter >> Jan 29, 2018  2:10 PM Robina Ade, Helene Kelp D wrote: CRM for notification. See Telephone encounter for: 01/29/18. Patient would like to let Dr. Raeford Razor know that she will continue her therapy session. Her insurance told her she had to let him know. If there are any questions, please call patient back, thanks.

## 2018-02-02 ENCOUNTER — Encounter: Payer: No Typology Code available for payment source | Admitting: Physical Therapy

## 2018-02-04 ENCOUNTER — Ambulatory Visit: Payer: No Typology Code available for payment source | Admitting: Physical Therapy

## 2018-02-04 ENCOUNTER — Encounter: Payer: Self-pay | Admitting: Physical Therapy

## 2018-02-04 DIAGNOSIS — M6281 Muscle weakness (generalized): Secondary | ICD-10-CM

## 2018-02-04 DIAGNOSIS — M5442 Lumbago with sciatica, left side: Principal | ICD-10-CM

## 2018-02-04 DIAGNOSIS — G8929 Other chronic pain: Secondary | ICD-10-CM

## 2018-02-04 NOTE — Therapy (Addendum)
[place] MAIN [facility] SERVICES [address] [place], [place], [address] Phone: [phone]   Fax:  [phone]  Physical Therapy Treatment  Patient Details  Name: Jennifer Ware MRN: [id number] Date of Birth: 12/08/1970 Referring Provider: Dr. Raeford Razor   Encounter Date: 02/04/2018  PT End of Session - 02/04/18 1634    Visit Number  2    Number of Visits  9    Date for PT Re-Evaluation  03/23/18    Authorization Type  visit: 2    PT Start Time  1630    PT Stop Time  3382    PT Time Calculation (min)  45 min    Activity Tolerance  Patient tolerated treatment well;No increased pain    Behavior During Therapy  WFL for tasks assessed/performed       Past Medical History:  Diagnosis Date  . Anxiety   . GERD (gastroesophageal reflux disease)   . Heart murmur   . Hx gestational diabetes   . Thyroid disease     Past Surgical History:  Procedure Laterality Date  . BREAST BIOPSY Right 08/24/2014   negative, u/s core  . CESAREAN SECTION     x3  . HERNIA REPAIR     umbilical  . UMBILICAL HERNIA REPAIR      There were no vitals filed for this visit.  Subjective Assessment - 02/04/18 1635    Subjective  Patient reports "I am having more back pain than when I started." Pt states she is unsure if leg pain is getting better or worse because she has not been up on her feet as much for work.     Pertinent History  47  yo Female reports low back pain that radiates into LLE that has been going on 6-12 months. 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     Limitations  Standing;Walking    How long can you sit comfortably?  NA    How long can you stand comfortably?  as long as  needed;     How long can you walk comfortably?  1 mile- with increased pain;     Diagnostic tests  MRI in Feb 2019: L4-5 left central/subarticular disc protrusion with impingement to L5 nerve root;     Patient Stated Goals  to reduce pain;     Currently in Pain?  Yes    Pain Score  4     Pain Location  Back    Pain Descriptors / Indicators  Aching;Burning    Pain Type  Chronic pain    Pain Onset  More than a month ago       Treatment: PT performed soft tissue mobilization to L paraspinals, QL, and top of the L gluteus medius secondary to tension and pain in these areas; spinous processes tender to palpation so joint mobilizations were not attempted x20 min; Patient exhibits improved mobility in paraspinals and reduced tenderness along QL and other paraspinals; Patient also reports less pain to 3/10 after manual therapy;   Reinforced HEP with instruction on sleep position and to continue with extension based exercise as this is reducing radicular symptoms.   Traction in prone position to low back, 70#  on, 40# off, 20 sec on, 10 sec off, total 15 min Tolerated traction well with less pain;  Patient reported no back pain following therapy session.                    PT Education - 02/04/18 1633    Education Details  spine mobility, extension exercises    Person(s) Educated  Patient    Methods  Explanation;Demonstration;Verbal cues    Comprehension  Verbalized understanding;Returned demonstration;Verbal cues required;Need further instruction       PT Short Term Goals - 01/26/18 0936      PT SHORT TERM GOAL #1   Title  Patient will be adherent to HEP at least 3x a week to improve functional strength and balance for better safety at home.    Time  4    Period  Weeks    Status  New    Target Date  02/23/18      PT SHORT TERM GOAL #2   Title  Patient will reduce modified Oswestry score to <20 as to demonstrate minimal disability with ADLs including improved sleeping  tolerance, walking/sitting tolerance etc for better mobility with ADLs.     Time  4    Period  Weeks    Status  New    Target Date  02/23/18      PT SHORT TERM GOAL #3   Title  Patient will improve lumbar AROM: lateral flexion >15 degrees bilaterally for better functional ROM for bending over to pick stuff up off the floor.     Time  4    Period  Weeks    Status  New    Target Date  02/23/18        PT Long Term Goals - 01/26/18 0945      PT LONG TERM GOAL #1   Title  Patient will report a worst pain of 3/10 on VAS in    low back with no LLE pain       to improve tolerance with ADLs and reduced symptoms with activities.     Time  8    Period  Weeks    Status  New    Target Date  03/23/18      PT LONG TERM GOAL #2   Title  patient will be able to work a full shift at work without increased in LLE pain to exhibit improved tolerance at work.     Time  8    Period  Weeks    Status  New    Target Date  03/23/18      PT LONG TERM GOAL #3   Title  Patient will improve multifidi activation in lumbar spine as evidenced by UE/LE lift to improve lumbar stabilization with daily tasks for less back pain.     Time  8    Period  Weeks    Status  New    Target Date  03/23/18            Plan - 02/04/18 1634    Clinical Impression Statement  Patient tolerated therapy session well. PT performed soft tissue mobilization secondary to back pain on the L side; reported pain decreased to 3/10 following soft tissue work. Pt tolerated traction to low back area well x15 min with no back pain/leg pain reported following traction. Pt instructed to note how long she was pain free following the session. Pt will continue to benefit from skilled PT intervention to address  LBP and L leg pain.   Rehab Potential  Good    Clinical Impairments Affecting Rehab Potential  motivated, young in age    PT Frequency  1x / week    PT Duration  8 weeks    PT Treatment/Interventions  ADLs/Self Care Home  Management;Cryotherapy;Electrical Stimulation;Moist Heat;Traction;Therapeutic exercise;Therapeutic activities;Functional mobility training;Patient/family education    PT Next Visit Plan  advance HEP, try traction, lumbar extension exercise    PT Home Exercise Plan  initiated;     Consulted and Agree with Plan of Care  Patient       Patient will benefit from skilled therapeutic intervention in order to improve the following deficits and impairments:  Decreased endurance, Hypomobility, Decreased activity tolerance, Decreased strength, Pain, Improper body mechanics  Visit Diagnosis: Chronic left-sided low back pain with left-sided sciatica  Muscle weakness (generalized)     Problem List Patient Active Problem List   Diagnosis Date Noted  . SI (sacroiliac) joint dysfunction 09/01/2017  . Sciatica of left side 09/01/2017  . Well woman exam with routine gynecological exam 08/11/2017  . Osteoarthritis of both knees 08/07/2016  . Gastroesophageal reflux disease 08/07/2016  . Chronic UTI 12/24/2011   Harriet Masson, SPT This entire session was performed under direct supervision and direction of a licensed therapist/therapist assistant . I have personally read, edited and approve of the note as written.   Trotter,Margaret PT, DPT 02/04/2018, 5:37 PM  [place] MAIN [facility] SERVICES [address] [place], [place], [address] Phone: [phone]   Fax:  [phone]  Name: Jennifer Ware MRN: [id number] Date of Birth: 05/31/1971

## 2018-02-09 ENCOUNTER — Encounter: Payer: No Typology Code available for payment source | Admitting: Physical Therapy

## 2018-02-11 ENCOUNTER — Ambulatory Visit: Payer: No Typology Code available for payment source | Attending: Family Medicine | Admitting: Physical Therapy

## 2018-02-11 ENCOUNTER — Encounter: Payer: Self-pay | Admitting: Physical Therapy

## 2018-02-11 DIAGNOSIS — M5442 Lumbago with sciatica, left side: Secondary | ICD-10-CM | POA: Insufficient documentation

## 2018-02-11 DIAGNOSIS — G8929 Other chronic pain: Secondary | ICD-10-CM | POA: Insufficient documentation

## 2018-02-11 DIAGNOSIS — M6281 Muscle weakness (generalized): Secondary | ICD-10-CM | POA: Insufficient documentation

## 2018-02-11 NOTE — Therapy (Addendum)
[place] MAIN [facility] SERVICES [address] [place], [place], [address] Phone: [phone]   Fax:  [phone]  Physical Therapy Treatment  Patient Details  Name: Jennifer Ware MRN: [id number] Date of Birth: 16-May-1971 Referring Provider: Dr. Raeford Razor   Encounter Date: 02/11/2018  PT End of Session - 02/11/18 0753    Visit Number  3    Number of Visits  9    Date for PT Re-Evaluation  03/23/18    Authorization Type  visit: 3    PT Start Time  0800    PT Stop Time  0845    PT Time Calculation (min)  45 min    Activity Tolerance  Patient tolerated treatment well;No increased pain    Behavior During Therapy  WFL for tasks assessed/performed       Past Medical History:  Diagnosis Date  . Anxiety   . GERD (gastroesophageal reflux disease)   . Heart murmur   . Hx gestational diabetes   . Thyroid disease     Past Surgical History:  Procedure Laterality Date  . BREAST BIOPSY Right 08/24/2014   negative, u/s core  . CESAREAN SECTION     x3  . HERNIA REPAIR     umbilical  . UMBILICAL HERNIA REPAIR      There were no vitals filed for this visit.  Subjective Assessment - 02/11/18 0801    Subjective  Patient reports "I'm not sure this is going to work for me- it's not getting any better." Pt states the extension exercises make her leg and back pain worse; has not tried any heat or ice. Pt reports having leg and back pain today; traction only provided relief for about 30 minutes following last session.      Pertinent History  47  yo Female reports low back pain that radiates into LLE that has been going on 6-12 months. 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     Limitations  Standing;Walking    How long can you sit comfortably?  NA    How long can you stand comfortably?  as long as needed;     How long can you walk comfortably?  1 mile- with increased pain;     Diagnostic tests  MRI in Feb 2019: L4-5 left central/subarticular disc protrusion with impingement to L5 nerve root;     Patient Stated Goals  to reduce pain;     Currently in Pain?  Yes    Pain Score  4     Pain Location  Back    Pain Descriptors / Indicators  Aching;Sore    Pain Type  Chronic pain    Pain Radiating Towards  radiates down LLE    Pain Onset  More than a month ago        Treatment Moist heat to low back: Double leg trunk rotation x20 sec holds x2 reps each direction, VCs for proper technique to bring knees all the way to the side to get deep stretch Single knee to chest x20 sec holds x2 reps each leg, VCs for proper hand positioning to get maximal stretch; cued to avoid painful or uncomfortable ROM Piriformis stretch, classic and modified,  x20 sec holds each position x2 reps each position bilaterally, VCs for positioning and hand placement with min A to achieve position at first  Pt reports pain as 2/10 following stretching; less discomfort overall.    PT performed soft tissue mobilization to L paraspinals, QL, and gluteus medius x10 min secondary to tension and pain in these areas; spinous processes tender to palpation so unable to perform joint mobilizations; patient exhibits improved mobility in paraspinals and reduced tenderness along QL   Traction in prone position to low back, 70# on, 40# off, 20 sec on, 10 sec off, 20 min total; tolerated traction well with less pain  Pt reported no pain in low back or LLE following traction.             PT Education - 02/11/18 0753    Education Details  spinal mobility, stretching, extension exercises, traction    Person(s) Educated  Patient    Methods  Explanation;Demonstration;Verbal cues    Comprehension   Verbalized understanding;Returned demonstration;Verbal cues required;Need further instruction       PT Short Term Goals - 01/26/18 0936      PT SHORT TERM GOAL #1   Title  Patient will be adherent to HEP at least 3x a week to improve functional strength and balance for better safety at home.    Time  4    Period  Weeks    Status  New    Target Date  02/23/18      PT SHORT TERM GOAL #2   Title  Patient will reduce modified Oswestry score to <20 as to demonstrate minimal disability with ADLs including improved sleeping tolerance, walking/sitting tolerance etc for better mobility with ADLs.     Time  4    Period  Weeks    Status  New    Target Date  02/23/18      PT SHORT TERM GOAL #3   Title  Patient will improve lumbar AROM: lateral flexion >15 degrees bilaterally for better functional ROM for bending over to pick stuff up off the floor.     Time  4    Period  Weeks    Status  New    Target Date  02/23/18        PT Long Term Goals - 01/26/18 0945      PT LONG TERM GOAL #1   Title  Patient will report a worst pain of 3/10 on VAS in    low back with no LLE pain       to improve tolerance with ADLs and reduced symptoms with activities.     Time  8    Period  Weeks    Status  New    Target Date  03/23/18      PT LONG TERM GOAL #2   Title  patient will be able to work a full shift at work without increased in LLE pain to exhibit improved tolerance at work.     Time  8    Period  Weeks    Status  New    Target Date  03/23/18      PT LONG TERM GOAL #3   Title  Patient will improve multifidi activation in lumbar spine as evidenced by UE/LE lift to improve lumbar stabilization with daily tasks for less back pain.     Time  8    Period  Weeks    Status  New    Target Date  03/23/18  Plan - 02/11/18 0754    Clinical Impression Statement  Patient tolerated therapy session well. PT performed soft tissue mobilization to L side of low back due to increased pain  and tenderness in that area. Pt performed stretches in supine on moist heat and reported decrease in LBP following stretches. Pt tolerated traction to low back area well x20 min with no back pain/leg pain reported following traction. Pt instructed to again note how long she was painfree following the session. Pt experienced increased LBP with extension exercises and instructed to no longer perform if increased aggravation in LBP. Pt will continue to benefit from skilled PT intervention to address LBP and L leg pain.    Rehab Potential  Good    Clinical Impairments Affecting Rehab Potential  motivated, young in age    PT Frequency  1x / week    PT Duration  8 weeks    PT Treatment/Interventions  ADLs/Self Care Home Management;Cryotherapy;Electrical Stimulation;Moist Heat;Traction;Therapeutic exercise;Therapeutic activities;Functional mobility training;Patient/family education    PT Next Visit Plan  advance HEP, try traction, lumbar extension exercise    PT Home Exercise Plan  initiated;     Consulted and Agree with Plan of Care  Patient       Patient will benefit from skilled therapeutic intervention in order to improve the following deficits and impairments:  Decreased endurance, Hypomobility, Decreased activity tolerance, Decreased strength, Pain, Improper body mechanics  Visit Diagnosis: Chronic left-sided low back pain with left-sided sciatica  Muscle weakness (generalized)     Problem List Patient Active Problem List   Diagnosis Date Noted  . SI (sacroiliac) joint dysfunction 09/01/2017  . Sciatica of left side 09/01/2017  . Well woman exam with routine gynecological exam 08/11/2017  . Osteoarthritis of both knees 08/07/2016  . Gastroesophageal reflux disease 08/07/2016  . Chronic UTI 12/24/2011   Harriet Masson, SPT This entire session was performed under direct supervision and direction of a licensed therapist/therapist assistant . I have personally read, edited and approve of  the note as written.  Trotter,Margaret PT, DPT 02/11/2018, 10:43 AM  [place] MAIN [facility] SERVICES [address] [place], [place], [address] Phone: [phone]   Fax:  [phone]  Name: Syesha Thaw MRN: [id number] Date of Birth: 1970/08/29

## 2018-02-16 ENCOUNTER — Encounter: Payer: No Typology Code available for payment source | Admitting: Physical Therapy

## 2018-02-18 ENCOUNTER — Ambulatory Visit: Payer: No Typology Code available for payment source | Admitting: Physical Therapy

## 2018-02-19 ENCOUNTER — Ambulatory Visit: Payer: No Typology Code available for payment source | Admitting: Physical Therapy

## 2018-02-19 ENCOUNTER — Encounter: Payer: Self-pay | Admitting: Physical Therapy

## 2018-02-19 DIAGNOSIS — M6281 Muscle weakness (generalized): Secondary | ICD-10-CM

## 2018-02-19 DIAGNOSIS — G8929 Other chronic pain: Secondary | ICD-10-CM

## 2018-02-19 DIAGNOSIS — M5442 Lumbago with sciatica, left side: Secondary | ICD-10-CM | POA: Diagnosis not present

## 2018-02-19 NOTE — Therapy (Signed)
[place] MAIN [facility] SERVICES [address] [place], [place], [address] Phone: [phone]   Fax:  [phone]  Physical Therapy Treatment  Patient Details  Name: Charma Mocarski MRN: [id number] Date of Birth: June 11, 1971 Referring Provider: Dr. Raeford Razor   Encounter Date: 02/19/2018  PT End of Session - 02/19/18 0800    Visit Number  4    Number of Visits  9    Date for PT Re-Evaluation  03/23/18    Authorization Type  visit: 4    PT Start Time  0800    PT Stop Time  0842    PT Time Calculation (min)  42 min    Activity Tolerance  Patient tolerated treatment well;No increased pain    Behavior During Therapy  WFL for tasks assessed/performed       Past Medical History:  Diagnosis Date  . Anxiety   . GERD (gastroesophageal reflux disease)   . Heart murmur   . Hx gestational diabetes   . Thyroid disease     Past Surgical History:  Procedure Laterality Date  . BREAST BIOPSY Right 08/24/2014   negative, u/s core  . CESAREAN SECTION     x3  . HERNIA REPAIR     umbilical  . UMBILICAL HERNIA REPAIR      There were no vitals filed for this visit.  Subjective Assessment - 02/19/18 0803    Subjective  Pt reports she believes she is feeling "a little better" overall.  Pt reports that she has been continuing with her extensions and having some pain during this exercise but pain is not worse or better after.  Pt has been completing her HEP 4 days/wk.     Pertinent History  47  yo Female reports low back pain that radiates into LLE that has been going on 6-12 months. 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     Limitations  Standing;Walking    How long can you sit  comfortably?  NA    How long can you stand comfortably?  as long as needed;     How long can you walk comfortably?  1 mile- with increased pain;     Diagnostic tests  MRI in Feb 2019: L4-5 left central/subarticular disc protrusion with impingement to L5 nerve root;     Patient Stated Goals  to reduce pain;     Currently in Pain?  Yes    Pain Score  3     Pain Location  Back    Pain Orientation  Left    Pain Descriptors / Indicators  -- electrical shock    Pain Type  Chronic pain    Pain Radiating Towards  L LE down to L ankle    Pain Onset  More than a month ago        TREATMENT  Moist heat to low back: Double leg trunk rotation x10 each direction. Instructed pt to complete stretch in painfree range when rotating to the R  Moist heat to low back: Double leg trunk rotation x20 sec holds x4 reps each direction, VCs for proper technique to bring knees all the way to the side to get deep stretch  Moist  heat to back: Single knee to chest x20 sec holds x2 reps each leg, VCs for proper hand positioning to get maximal stretch; cued to avoid painful or uncomfortable ROM  Moist heat to back: piriformis stretch 3x30 seconds each LE  Pt reports pain as 0/10 following stretching.  Manual lumbar traction with strap 4x30 seconds  PT performed soft tissue mobilization to L gluteus medius secondary to tension and pain in this area. No tension or pain noted in L lumbar paraspinals this session.  TrA activation in hooklying with verbal and tactile cues with pt palpating for TrA activation x20  TrA activation with 10 second holds 2x10                         PT Education - 02/19/18 0800    Education Details  Exercise technique    Person(s) Educated  Patient    Methods  Demonstration;Explanation;Verbal cues    Comprehension  Verbalized understanding;Returned demonstration;Verbal cues required;Need further instruction       PT Short Term Goals - 01/26/18 0936      PT SHORT TERM  GOAL #1   Title  Patient will be adherent to HEP at least 3x a week to improve functional strength and balance for better safety at home.    Time  4    Period  Weeks    Status  New    Target Date  02/23/18      PT SHORT TERM GOAL #2   Title  Patient will reduce modified Oswestry score to <20 as to demonstrate minimal disability with ADLs including improved sleeping tolerance, walking/sitting tolerance etc for better mobility with ADLs.     Time  4    Period  Weeks    Status  New    Target Date  02/23/18      PT SHORT TERM GOAL #3   Title  Patient will improve lumbar AROM: lateral flexion >15 degrees bilaterally for better functional ROM for bending over to pick stuff up off the floor.     Time  4    Period  Weeks    Status  New    Target Date  02/23/18        PT Long Term Goals - 01/26/18 0945      PT LONG TERM GOAL #1   Title  Patient will report a worst pain of 3/10 on VAS in    low back with no LLE pain       to improve tolerance with ADLs and reduced symptoms with activities.     Time  8    Period  Weeks    Status  New    Target Date  03/23/18      PT LONG TERM GOAL #2   Title  patient will be able to work a full shift at work without increased in LLE pain to exhibit improved tolerance at work.     Time  8    Period  Weeks    Status  New    Target Date  03/23/18      PT LONG TERM GOAL #3   Title  Patient will improve multifidi activation in lumbar spine as evidenced by UE/LE lift to improve lumbar stabilization with daily tasks for less back pain.     Time  8    Period  Weeks    Status  New    Target Date  03/23/18  Plan - 02/19/18 0807    Clinical Impression Statement  Pt reports pain at end range of R lumbar rotation exercise and was instructed to perform in a painfree range.  Pt reported pain and tension noted in L glute med, thus performed STM to this region.  Pt denied tension or pain in L lumbar paraspinals this region.  Pt remains tender  along lower lumbar spinous processes so joint mobilizations were deferred again.  Introduced TrA activation and endurance exercises this session as pt reports her pain is worst after longer periods of standing or sitting.  Pt will benefit from continued skilled PT interventions for decreased pain and improved QOL.      Rehab Potential  Good    Clinical Impairments Affecting Rehab Potential  motivated, young in age    PT Frequency  1x / week    PT Duration  8 weeks    PT Treatment/Interventions  ADLs/Self Care Home Management;Cryotherapy;Electrical Stimulation;Moist Heat;Traction;Therapeutic exercise;Therapeutic activities;Functional mobility training;Patient/family education    PT Next Visit Plan  advance HEP, try traction, lumbar extension exercise    PT Home Exercise Plan  initiated;     Consulted and Agree with Plan of Care  Patient       Patient will benefit from skilled therapeutic intervention in order to improve the following deficits and impairments:  Decreased endurance, Hypomobility, Decreased activity tolerance, Decreased strength, Pain, Improper body mechanics  Visit Diagnosis: Chronic left-sided low back pain with left-sided sciatica  Muscle weakness (generalized)     Problem List Patient Active Problem List   Diagnosis Date Noted  . SI (sacroiliac) joint dysfunction 09/01/2017  . Sciatica of left side 09/01/2017  . Well woman exam with routine gynecological exam 08/11/2017  . Osteoarthritis of both knees 08/07/2016  . Gastroesophageal reflux disease 08/07/2016  . Chronic UTI 12/24/2011    Collie Siad PT, DPT 02/19/2018, 8:41 AM  [place] MAIN [facility] SERVICES [address] [place], [place], [address] Phone: [phone]   Fax:  [phone]  Name: Emmaleigh Longo MRN: [id number] Date of Birth: [birth year]

## 2018-02-23 ENCOUNTER — Encounter: Payer: No Typology Code available for payment source | Admitting: Physical Therapy

## 2018-02-25 ENCOUNTER — Ambulatory Visit: Payer: No Typology Code available for payment source | Admitting: Physical Therapy

## 2018-02-26 ENCOUNTER — Ambulatory Visit: Payer: No Typology Code available for payment source | Admitting: Physical Therapy

## 2018-02-26 ENCOUNTER — Encounter: Payer: Self-pay | Admitting: Physical Therapy

## 2018-02-26 DIAGNOSIS — G8929 Other chronic pain: Secondary | ICD-10-CM

## 2018-02-26 DIAGNOSIS — M5442 Lumbago with sciatica, left side: Secondary | ICD-10-CM | POA: Diagnosis not present

## 2018-02-26 DIAGNOSIS — M6281 Muscle weakness (generalized): Secondary | ICD-10-CM

## 2018-02-26 NOTE — Therapy (Signed)
[place] MAIN [facility] SERVICES [address] [place], [place], [address] Phone: [phone]   Fax:  [phone]  Physical Therapy Treatment  Patient Details  Name: Aleanna Menge MRN: [id number] Date of Birth: [birth year] Referring Provider: Dr. Raeford Razor   Encounter Date: 02/26/2018  PT End of Session - 02/26/18 0803    Visit Number  5    Number of Visits  9    Date for PT Re-Evaluation  03/23/18    Authorization Type  visit: 5    PT Start Time  0801    PT Stop Time  0848    PT Time Calculation (min)  47 min    Activity Tolerance  Patient tolerated treatment well;No increased pain    Behavior During Therapy  WFL for tasks assessed/performed       Past Medical History:  Diagnosis Date  . Anxiety   . GERD (gastroesophageal reflux disease)   . Heart murmur   . Hx gestational diabetes   . Thyroid disease     Past Surgical History:  Procedure Laterality Date  . BREAST BIOPSY Right 08/24/2014   negative, u/s core  . CESAREAN SECTION     x3  . HERNIA REPAIR     umbilical  . UMBILICAL HERNIA REPAIR      There were no vitals filed for this visit.  Subjective Assessment - 02/26/18 0809    Subjective  Pt reports that she was doing better after last session but then after working the weekend (3 days) she began having her pain return. She went to the gym this morning before her session and is having some more pain because of this.  Pt believes she is 48% better since starting therapy.  Pt thinks the stretching the most.  She says drinking tumeric is helping and soda makes her pain worse.      Pertinent History  47  yo Female reports low back pain that radiates into LLE that has been going on 6-12 months. 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     Limitations  Standing;Walking    How long can you sit comfortably?  NA    How long can you stand comfortably?  as long as needed;     How long can you walk comfortably?  1 mile- with increased pain;     Diagnostic tests  MRI in Feb 2019: L4-5 left central/subarticular disc protrusion with impingement to L5 nerve root;     Patient Stated Goals  to reduce pain;     Currently in Pain?  Yes    Pain Score  5     Pain Location  Back    Pain Orientation  Left;Lower    Pain Descriptors / Indicators  Aching    Pain Type  Chronic pain    Pain Radiating Towards  L calf    Pain Onset  More than a month ago        TREATMENT   Moist heat to low back: Double leg trunk rotation x10 each direction. Instructed pt to complete stretch in painfree range when rotating to the R   Moist heat to back: Single knee to chest x20 sec holds  x2 reps each leg, VCs for proper hand positioning to get maximal stretch; cued to avoid painful or uncomfortable ROM   Moist heat to back: piriformis stretch 3x30 seconds each LE   Seated sciatic nerve glide with cues and demonstration for proper technique x10 (added to HEP with emphasis not to overdo this exercise)  Manual lumbar traction with strap 4x30 seconds   TrA activation in hooklying with verbal and tactile cues with pt palpating for TrA activation x20   TrA activation with 10 second holds 2x10 (added to HEP)                        PT Education - 02/26/18 0803    Education Details  Exercise technique    Person(s) Educated  Patient    Methods  Explanation;Demonstration;Verbal cues    Comprehension  Verbalized understanding;Returned demonstration;Verbal cues required;Need further instruction       PT Short Term Goals - 01/26/18 0936      PT SHORT TERM GOAL #1   Title  Patient will be adherent to HEP at least 3x a week to improve functional strength and  balance for better safety at home.    Time  4    Period  Weeks    Status  New    Target Date  02/23/18      PT SHORT TERM GOAL #2   Title  Patient will reduce modified Oswestry score to <20 as to demonstrate minimal disability with ADLs including improved sleeping tolerance, walking/sitting tolerance etc for better mobility with ADLs.     Time  4    Period  Weeks    Status  New    Target Date  02/23/18      PT SHORT TERM GOAL #3   Title  Patient will improve lumbar AROM: lateral flexion >15 degrees bilaterally for better functional ROM for bending over to pick stuff up off the floor.     Time  4    Period  Weeks    Status  New    Target Date  02/23/18        PT Long Term Goals - 01/26/18 0945      PT LONG TERM GOAL #1   Title  Patient will report a worst pain of 3/10 on VAS in    low back with no LLE pain       to improve tolerance with ADLs and reduced symptoms with activities.     Time  8    Period  Weeks    Status  New    Target Date  03/23/18      PT LONG TERM GOAL #2   Title  patient will be able to work a full shift at work without increased in LLE pain to exhibit improved tolerance at work.     Time  8    Period  Weeks    Status  New    Target Date  03/23/18      PT LONG TERM GOAL #3   Title  Patient will improve multifidi activation in lumbar spine as evidenced by UE/LE lift to improve lumbar stabilization with daily tasks for less back pain.     Time  8    Period  Weeks    Status  New    Target Date  03/23/18            Plan - 02/26/18 6720    Clinical Impression Statement  Pt reports that standing for longer periods of time and any motions she does to the extreme causes her pain to worsen.  Therefore, continued TrA activation exercises with transition to endurance training.  Pt required verbal and tactile cues for proper TrA activation again this session.  Added this to her HEP.  Discontinued prone press ups as pt reports pain with this exercise.  Pt to  continue with standing extensions.  Pt instructed to sleep with pillow between her knees when in sidelying as pt reports having pain when lying on her L side.  Pt will benefit from continued skilled PT interventions for decreased pain.      Rehab Potential  Good    Clinical Impairments Affecting Rehab Potential  motivated, young in age    PT Frequency  1x / week    PT Duration  8 weeks    PT Treatment/Interventions  ADLs/Self Care Home Management;Cryotherapy;Electrical Stimulation;Moist Heat;Traction;Therapeutic exercise;Therapeutic activities;Functional mobility training;Patient/family education    PT Next Visit Plan  advance HEP, try traction, lumbar extension exercise    PT Home Exercise Plan  Standing extension, log roll technique, TrA activation, L sciatic nerve glide    Consulted and Agree with Plan of Care  Patient       Patient will benefit from skilled therapeutic intervention in order to improve the following deficits and impairments:  Decreased endurance, Hypomobility, Decreased activity tolerance, Decreased strength, Pain, Improper body mechanics  Visit Diagnosis: Chronic left-sided low back pain with left-sided sciatica  Muscle weakness (generalized)     Problem List Patient Active Problem List   Diagnosis Date Noted  . SI (sacroiliac) joint dysfunction 09/01/2017  . Sciatica of left side 09/01/2017  . Well woman exam with routine gynecological exam 08/11/2017  . Osteoarthritis of both knees 08/07/2016  . Gastroesophageal reflux disease 08/07/2016  . Chronic UTI 12/24/2011    Collie Siad PT, DPT 02/26/2018, 8:49 AM  [place] MAIN [facility] SERVICES [address] [place], [place], [address] Phone: [phone]   Fax:  [phone]  Name: Loreta Blouch MRN: [id number] Date of Birth: [birth year]

## 2018-02-26 NOTE — Patient Instructions (Signed)
Access Code: [email or web address]  URL: [email or web address]  Date: 02/26/2018  Prepared by: Collie Siad   Exercises  Sitting to Supine Roll - 2x daily  Seated Sciatic Nerve Tensioner - 10 reps - 1 sets - 2x daily - 7x weekly  Hooklying Transversus Abdominis Palpation - 20 reps - 2 sets - 10 hold - 2x daily - 7x weekly

## 2018-03-02 ENCOUNTER — Other Ambulatory Visit: Payer: Self-pay | Admitting: Internal Medicine

## 2018-03-02 DIAGNOSIS — N390 Urinary tract infection, site not specified: Secondary | ICD-10-CM

## 2018-03-06 ENCOUNTER — Other Ambulatory Visit: Payer: Self-pay

## 2018-03-06 DIAGNOSIS — N390 Urinary tract infection, site not specified: Secondary | ICD-10-CM

## 2018-03-06 MED ORDER — NITROFURANTOIN MONOHYD MACRO 100 MG PO CAPS: ORAL_CAPSULE | ORAL | 4 refills | Status: DC

## 2018-03-12 ENCOUNTER — Ambulatory Visit: Payer: No Typology Code available for payment source | Attending: Family Medicine

## 2018-03-12 VITALS — BP 112/74 | HR 75

## 2018-03-12 DIAGNOSIS — M6281 Muscle weakness (generalized): Secondary | ICD-10-CM | POA: Diagnosis present

## 2018-03-12 DIAGNOSIS — M5442 Lumbago with sciatica, left side: Secondary | ICD-10-CM | POA: Diagnosis present

## 2018-03-12 DIAGNOSIS — G8929 Other chronic pain: Secondary | ICD-10-CM | POA: Insufficient documentation

## 2018-03-12 NOTE — Therapy (Signed)
[place] MAIN [facility] SERVICES [address] [place], [place], [address] Phone: [phone]   Fax:  [phone]  Physical Therapy Treatment  Patient Details  Name: Jennifer Ware MRN: [id number] Date of Birth: [birth year] Referring Provider: Dr. Raeford Razor   Encounter Date: 03/12/2018  PT End of Session - 03/12/18 0818    Visit Number  6    Number of Visits  9    Date for PT Re-Evaluation  03/23/18    Authorization Type  visit: 6    PT Start Time  0809    PT Stop Time  0855    PT Time Calculation (min)  46 min    Activity Tolerance  Patient tolerated treatment well;No increased pain    Behavior During Therapy  WFL for tasks assessed/performed       Past Medical History:  Diagnosis Date  . Anxiety   . GERD (gastroesophageal reflux disease)   . Heart murmur   . Hx gestational diabetes   . Thyroid disease     Past Surgical History:  Procedure Laterality Date  . BREAST BIOPSY Right 08/24/2014   negative, u/s core  . CESAREAN SECTION     x3  . HERNIA REPAIR     umbilical  . UMBILICAL HERNIA REPAIR      Vitals:   03/12/18 0812  BP: 112/74  Pulse: 75  SpO2: 99%    Subjective Assessment - 03/12/18 0808    Subjective  Pt states that she is doing well on this date. She denies pain currently and believes that therapy has been helping. She is having less pain now than when she started therapy. No specific questions or concerns at this time.     Pertinent History  47  yo Female reports low back pain that radiates into LLE that has been going on 6-12 months. 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     Limitations  Standing;Walking    How long can you sit comfortably?   NA    How long can you stand comfortably?  as long as needed;     How long can you walk comfortably?  1 mile- with increased pain;     Diagnostic tests  MRI in Feb 2019: L4-5 left central/subarticular disc protrusion with impingement to L5 nerve root;     Patient Stated Goals  to reduce pain;     Currently in Pain?  No/denies    Pain Onset  --           TREATMENT   Manual Therapy  NuStep L2 x 5 minutes for warm-up during history with moist heat pack to low back (4 minutes unbilled); L HS stretch with active sciatic nerve glides by patient performing ankle DF/PF 30s x 3; L figure 4 stretch 30s hold x 3; L piriformis stretch 30s hold x 3; L single knee to chest stretch 30s hold x 3; Hooklying double leg lumbar rotation stretch 30s hold x 3 bilateral, no pain reported; L hip long axis distraction with oscillations and belt assist 30s x 3; L hip inferior mobilizations at 90 hip flexion with belt assist 30s x 3; R sidelying L L4-5 gapping mobilization, grade III 30s  x 3; Prone on elbows with grade III mobilizations, 30s/bout at L4 and L5 x 2 bouts each; Prone on elbows in road kill position with LLE (FABER) with grade III mobilizations, 30s/bout at L4 and L5 x 3 bouts;                     PT Education - 03/12/18 0818    Education Details  exercise form/technique    Person(s) Educated  Patient    Methods  Explanation    Comprehension  Verbalized understanding       PT Short Term Goals - 01/26/18 0936      PT SHORT TERM GOAL #1   Title  Patient will be adherent to HEP at least 3x a week to improve functional strength and balance for better safety at home.    Time  4    Period  Weeks    Status  New    Target Date  02/23/18      PT SHORT TERM GOAL #2   Title  Patient will reduce modified Oswestry score to <20 as to demonstrate minimal disability with ADLs including improved sleeping tolerance, walking/sitting tolerance etc for better mobility with ADLs.      Time  4    Period  Weeks    Status  New    Target Date  02/23/18      PT SHORT TERM GOAL #3   Title  Patient will improve lumbar AROM: lateral flexion >15 degrees bilaterally for better functional ROM for bending over to pick stuff up off the floor.     Time  4    Period  Weeks    Status  New    Target Date  02/23/18        PT Long Term Goals - 01/26/18 0945      PT LONG TERM GOAL #1   Title  Patient will report a worst pain of 3/10 on VAS in    low back with no LLE pain       to improve tolerance with ADLs and reduced symptoms with activities.     Time  8    Period  Weeks    Status  New    Target Date  03/23/18      PT LONG TERM GOAL #2   Title  patient will be able to work a full shift at work without increased in LLE pain to exhibit improved tolerance at work.     Time  8    Period  Weeks    Status  New    Target Date  03/23/18      PT LONG TERM GOAL #3   Title  Patient will improve multifidi activation in lumbar spine as evidenced by UE/LE lift to improve lumbar stabilization with daily tasks for less back pain.     Time  8    Period  Weeks    Status  New    Target Date  03/23/18            Plan - 03/12/18 0818    Clinical Impression Statement  Pt does not provide a lot of feedback to therapist during session. With HS stretches and sciatic nerve glides she is able to gradually able to improve her SLR range of motion prior to onset of pain. Pt encouraged to progress her standing repeated extension to prone on elbow. Pt advised to continue HEP and follow-up as scheduled.     Rehab  Potential  Good    Clinical Impairments Affecting Rehab Potential  motivated, young in age    PT Frequency  1x / week    PT Duration  8 weeks    PT Treatment/Interventions  ADLs/Self Care Home Management;Cryotherapy;Electrical Stimulation;Moist Heat;Traction;Therapeutic exercise;Therapeutic activities;Functional mobility training;Patient/family education    PT Next Visit Plan  advance  HEP, try traction, lumbar extension exercise    PT Home Exercise Plan  Standing extension as well as prone on elbows, log roll technique, TrA activation, L sciatic nerve glide    Consulted and Agree with Plan of Care  Patient       Patient will benefit from skilled therapeutic intervention in order to improve the following deficits and impairments:  Decreased endurance, Hypomobility, Decreased activity tolerance, Decreased strength, Pain, Improper body mechanics  Visit Diagnosis: Chronic left-sided low back pain with left-sided sciatica  Muscle weakness (generalized)     Problem List Patient Active Problem List   Diagnosis Date Noted  . SI (sacroiliac) joint dysfunction 09/01/2017  . Sciatica of left side 09/01/2017  . Well woman exam with routine gynecological exam 08/11/2017  . Osteoarthritis of both knees 08/07/2016  . Gastroesophageal reflux disease 08/07/2016  . Chronic UTI 12/24/2011   Phillips Grout PT, DPT, GCS  Kaiyon Hynes 03/12/2018, 1:19 PM  [place] MAIN [facility] SERVICES [address] [place], [place], [address] Phone: [phone]   Fax:  [phone]  Name: Jennifer Ware MRN: [id number] Date of Birth: Oct 17, 1970

## 2018-03-19 ENCOUNTER — Ambulatory Visit: Payer: No Typology Code available for payment source | Admitting: Physical Therapy

## 2018-03-19 ENCOUNTER — Encounter: Payer: Self-pay | Admitting: Physical Therapy

## 2018-03-19 DIAGNOSIS — M5442 Lumbago with sciatica, left side: Principal | ICD-10-CM

## 2018-03-19 DIAGNOSIS — G8929 Other chronic pain: Secondary | ICD-10-CM

## 2018-03-19 DIAGNOSIS — M6281 Muscle weakness (generalized): Secondary | ICD-10-CM

## 2018-03-19 NOTE — Therapy (Addendum)
[place] MAIN [facility] SERVICES [address] [place], [place], [address] Phone: [phone]   Fax:  [phone]  Physical Therapy Treatment/Discharge  Patient Details  Name: Jennifer Ware MRN: [id number] Date of Birth: 1971/03/20 Referring Provider: Dr. Raeford Razor   Encounter Date: 03/19/2018  PT End of Session - 03/19/18 0800    Visit Number  7    Number of Visits  9    Date for PT Re-Evaluation  03/23/18    Authorization Type  visit: 7    PT Start Time  0803    PT Stop Time  0845    PT Time Calculation (min)  42 min    Activity Tolerance  Patient tolerated treatment well;No increased pain    Behavior During Therapy  WFL for tasks assessed/performed       Past Medical History:  Diagnosis Date  . Anxiety   . GERD (gastroesophageal reflux disease)   . Heart murmur   . Hx gestational diabetes   . Thyroid disease     Past Surgical History:  Procedure Laterality Date  . BREAST BIOPSY Right 08/24/2014   negative, u/s core  . CESAREAN SECTION     x3  . HERNIA REPAIR     umbilical  . UMBILICAL HERNIA REPAIR      There were no vitals filed for this visit.  Subjective Assessment - 03/19/18 0803    Subjective  Patient states she is doing well; no complaints of pain currently. Pt reports she is just getting off work and did not have a busy night so no increase in pain. Pt reports she believes the pain is getting better since starting therapy; reports feeling about 40% better since starting therapy.    Pertinent History  47  yo Female reports low back pain that radiates into LLE that has been going on 6-12 months. 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     Limitations   Standing;Walking    How long can you sit comfortably?  NA    How long can you stand comfortably?  as long as needed;     How long can you walk comfortably?  1 mile- with increased pain;     Diagnostic tests  MRI in Feb 2019: L4-5 left central/subarticular disc protrusion with impingement to L5 nerve root;     Patient Stated Goals  to reduce pain;     Currently in Pain?  No/denies        [facility] PT Assessment - 03/19/18 0001      Observation/Other Assessments   Modified Oswertry  14%       AROM   Lumbar Flexion  60    Lumbar Extension  20    Lumbar - Right Side Bend  30    Lumbar - Left Side Bend  20        Treatment Assessed AROM, completed mODI, reviewed goals and progress towards goal.   Moist heat to low back: Double leg trunk rotation x2 min, VCs for avoiding painfree motion and range  Single knee to chest x20 sec holds x2 reps each leg, VCs for proper hand positioning to get maximal stretch; cued to avoid painful or uncomfortable  ROM  Piriformis stretch, classic and modified, x20 sec holds each position x2 reps each position bilaterally, VCs for avoiding painful ROM but still feeling comfortable sretch  Prone: Assessed multifidus activation in UE/LE lift to assess appropriate muscle activation Attempted to assess joint mobility with PA mobilizations but painful with pressure at L3-L5; no tenderness noted in soft tissue in low back region                 PT Education - 03/19/18 0800    Education Details  exercise technique, stretching    Person(s) Educated  Patient    Methods  Explanation;Verbal cues    Comprehension  Verbalized understanding;Verbal cues required       PT Short Term Goals - 03/19/18 0819      PT SHORT TERM GOAL #1   Title  Patient will be adherent to HEP at least 3x a week to improve functional strength and balance for better safety at home.    Baseline  03/19/18: sometimes 4 or 5 times a week    Time  4    Period  Weeks    Status   Achieved      PT SHORT TERM GOAL #2   Title  Patient will reduce modified Oswestry score to <20 as to demonstrate minimal disability with ADLs including improved sleeping tolerance, walking/sitting tolerance etc for better mobility with ADLs.     Baseline  03/19/18: 14% indicates minimal disability    Time  4    Period  Weeks    Status  Achieved      PT SHORT TERM GOAL #3   Title  Patient will improve lumbar AROM: lateral flexion >15 degrees bilaterally for better functional ROM for bending over to pick stuff up off the floor.     Time  4    Period  Weeks    Status  Achieved        PT Long Term Goals - 03/19/18 0820      PT LONG TERM GOAL #1   Title  Patient will report a worst pain of 3/10 on VAS in    low back with no LLE pain       to improve tolerance with ADLs and reduced symptoms with activities.     Baseline  03/19/18: 7/10 at worse in back but often does still have leg pain; moments of pain free motion and activity    Time  8    Period  Weeks    Status  Partially Met      PT LONG TERM GOAL #2   Title  patient will be able to work a full shift at work without increased in LLE pain to exhibit improved tolerance at work.     Baseline  03/19/18: some LLE pain on busy shifts when cannot sit much but does not have LLE pain when able to take rest breaks during work    Time  8    Period  Weeks    Status  Partially Met      PT LONG TERM GOAL #3   Title  Patient will improve multifidi activation in lumbar spine as evidenced by UE/LE lift to improve lumbar stabilization with daily tasks for less back pain.     Baseline  03/19/18: multifidi activated in UE/LE lift    Time  8    Period  Weeks    Status  Achieved            Plan -  03/19/18 0830    Clinical Impression Statement  Pt tolerated therapy session well. Pt presented to session with no LBP or LLE pain; indicated desire to stop therapy currently due to progress and being able to continue exercises at home. PT evaluated AROM  and noted improvement in lumbar flexion and lateral flexion to both directions; improved modified Oswestry to 14% indicating minimal disability due to back pain. Pt reports no pain while working last shift and only notices increases in LBP and LLE pain when working intense shift without ability to sit and take a break; does not experience pain constantly at this time. Pt demonstrated independence in performing HEP exercises and stretches. Pt is being discharged at this time due to patient request as well as meeting all short term goals and making progress to long term goals. Pt advised to call if any changes or increases in back pain to return to PT for a screen to see if appropriate for more physical therapy.     Rehab Potential  Good    Clinical Impairments Affecting Rehab Potential  motivated, young in age    PT Frequency  1x / week    PT Duration  8 weeks    PT Treatment/Interventions  ADLs/Self Care Home Management;Cryotherapy;Electrical Stimulation;Moist Heat;Traction;Therapeutic exercise;Therapeutic activities;Functional mobility training;Patient/family education    PT Next Visit Plan  advance HEP, try traction, lumbar extension exercise    PT Home Exercise Plan  Standing extension as well as prone on elbows, log roll technique, TrA activation, L sciatic nerve glide    Consulted and Agree with Plan of Care  Patient       Patient will benefit from skilled therapeutic intervention in order to improve the following deficits and impairments:  Decreased endurance, Hypomobility, Decreased activity tolerance, Decreased strength, Pain, Improper body mechanics  Visit Diagnosis: Chronic left-sided low back pain with left-sided sciatica  Muscle weakness (generalized)     Problem List Patient Active Problem List   Diagnosis Date Noted  . SI (sacroiliac) joint dysfunction 09/01/2017  . Sciatica of left side 09/01/2017  . Well woman exam with routine gynecological exam 08/11/2017  .  Osteoarthritis of both knees 08/07/2016  . Gastroesophageal reflux disease 08/07/2016  . Chronic UTI 12/24/2011   Harriet Masson, SPT This entire session was performed under direct supervision and direction of a licensed therapist/therapist assistant . I have personally read, edited and approve of the note as written.  Trotter,Margaret PT, DPT 03/19/2018, 12:04 PM  [place] MAIN [facility] SERVICES [address] [place], [place], [address] Phone: [phone]   Fax:  [phone]  Name: Jennifer Ware MRN: [id number] Date of Birth: 04-13-1971

## 2018-03-26 ENCOUNTER — Encounter: Payer: No Typology Code available for payment source | Admitting: Physical Therapy

## 2018-04-01 ENCOUNTER — Encounter: Payer: No Typology Code available for payment source | Admitting: Physical Therapy

## 2018-04-09 ENCOUNTER — Encounter: Payer: No Typology Code available for payment source | Admitting: Physical Therapy

## 2018-04-16 ENCOUNTER — Encounter: Payer: No Typology Code available for payment source | Admitting: Physical Therapy

## 2018-04-23 ENCOUNTER — Encounter: Payer: No Typology Code available for payment source | Admitting: Physical Therapy

## 2018-04-30 ENCOUNTER — Encounter: Payer: No Typology Code available for payment source | Admitting: Physical Therapy

## 2018-05-07 ENCOUNTER — Encounter: Payer: No Typology Code available for payment source | Admitting: Physical Therapy

## 2018-05-14 ENCOUNTER — Encounter: Payer: No Typology Code available for payment source | Admitting: Physical Therapy

## 2018-05-21 ENCOUNTER — Encounter: Payer: No Typology Code available for payment source | Admitting: Physical Therapy

## 2018-08-11 ENCOUNTER — Ambulatory Visit (INDEPENDENT_AMBULATORY_CARE_PROVIDER_SITE_OTHER): Payer: No Typology Code available for payment source | Admitting: Family Medicine

## 2018-08-11 ENCOUNTER — Encounter: Payer: Self-pay | Admitting: Family Medicine

## 2018-08-11 VITALS — BP 104/68 | HR 71 | Temp 98.4°F | Ht 62.5 in | Wt 144.6 lb

## 2018-08-11 DIAGNOSIS — K219 Gastro-esophageal reflux disease without esophagitis: Secondary | ICD-10-CM

## 2018-08-11 DIAGNOSIS — Z01419 Encounter for gynecological examination (general) (routine) without abnormal findings: Secondary | ICD-10-CM | POA: Diagnosis not present

## 2018-08-11 DIAGNOSIS — Z13220 Encounter for screening for lipoid disorders: Secondary | ICD-10-CM | POA: Diagnosis not present

## 2018-08-11 NOTE — Assessment & Plan Note (Signed)
Reviewed preventive care protocols, scheduled due services, and updated immunizations Discussed nutrition, exercise, diet, and healthy lifestyle.  Discussed USPSTF recommendations of cervical cancer screening.  She is aware that interval of 3 years is recommended but pt would prefer to have pap smear done today.

## 2018-08-11 NOTE — Patient Instructions (Addendum)
Great to see you. I will call you with your lab results from today and you can view them online.   Happy New Year!

## 2018-08-11 NOTE — Progress Notes (Signed)
Subjective:   Patient ID: Jennifer Ware, female    DOB: Jan 28, 1971, 47 y.o.   MRN: [id number]  Jennifer Ware is a pleasant 47 y.o. year old female who presents to clinic today with Annual Exam (Patient is here today for a CPE with PAP. Last Mammogram 6.13.19. She is currently fasting.)  on 08/11/2018  HPI:   Doing well. Has no complaints.  Pap smear -  08/11/2017, done by me.  She does want pap done today. Mammogram 01/22/18.   Lab Results  Component Value Date   CHOL 169 08/11/2017   HDL 68.90 08/11/2017   LDLCALC 92 08/11/2017   TRIG 42.0 08/11/2017   CHOLHDL 2 08/11/2017   Lab Results  Component Value Date   CREATININE 0.80 08/11/2017   Lab Results  Component Value Date   TSH 1.54 08/11/2017   Lab Results  Component Value Date   NA 142 08/11/2017   K 3.8 08/11/2017   CL 103 08/11/2017   CO2 31 08/11/2017   Lab Results  Component Value Date   WBC 6.2 08/11/2017   HGB 13.7 08/11/2017   HCT 40.8 08/11/2017   MCV 87.9 08/11/2017   PLT 158.0 08/11/2017   Current Outpatient Medications on File Prior to Visit  Medication Sig Dispense Refill  . AMBULATORY NON FORMULARY MEDICATION Thigh-high, medium compression, graduated compression stockings. Apply to lower extremities. [email or web address], Zippered Compression Stockings, medium circ, long length 2 each 0  . Ascorbic Acid (VITAMIN C) 1000 MG tablet Take 1,000 mg by mouth daily.    Marland Kitchen BIOTIN 5000 PO Take by mouth daily.    . Calcium 600-200 MG-UNIT per tablet Take 2 tablets by mouth daily.    . Cranberry 1000 MG CAPS Take 500 mg by mouth as needed.     . cyanocobalamin 1000 MCG tablet Take 100 mcg by mouth daily.    . Diclofenac Sodium (PENNSAID) 2 % SOLN Place 1 application onto the skin 2 (two) times daily. 1 Bottle 3  . Fluocinolone Acetonide 0.01 % OIL 2 drops to affected ear, No more than 1 week at a time 1 Bottle 3  . Ginkgo Biloba 40 MG TABS Take 1 tablet by mouth daily.    Marland Kitchen glucosamine-chondroitin  500-400 MG tablet Take 1 tablet by mouth 2 (two) times daily.    . Lecithin 1200 MG CAPS Take 1 capsule by mouth as needed.     . meloxicam (MOBIC) 15 MG tablet Take 1 tablet (15 mg total) by mouth daily. 30 tablet 11  . Multiple Vitamins-Minerals (ANTIOXIDANT PO) Take by mouth daily.    . nitrofurantoin, macrocrystal-monohydrate, (MACROBID) 100 MG capsule Take as directed for UTI suppression 30 capsule 4   No current facility-administered medications on file prior to visit.     Allergies  Allergen Reactions  . Flagyl [Metronidazole]   . Sulfa Antibiotics     Past Medical History:  Diagnosis Date  . Anxiety   . GERD (gastroesophageal reflux disease)   . Heart murmur   . Hx gestational diabetes   . Thyroid disease     Past Surgical History:  Procedure Laterality Date  . BREAST BIOPSY Right 08/24/2014   negative, u/s core  . CESAREAN SECTION     x3  . HERNIA REPAIR     umbilical  . UMBILICAL HERNIA REPAIR      Family History  Problem Relation Age of Onset  . Asthma Father   . Colon cancer Neg Hx   . Breast cancer Neg  Hx     Social History   Socioeconomic History  . Marital status: Married    Spouse name: Not on file  . Number of children: 3  . Years of education: Not on file  . Highest education level: Not on file  Occupational History  . Occupation: Economist: [employer]  . Financial resource strain: Not on file  . Food insecurity:    Worry: Not on file    Inability: Not on file  . Transportation needs:    Medical: Not on file    Non-medical: Not on file  Tobacco Use  . Smoking status: Never Smoker  . Smokeless tobacco: Never Used  Substance and Sexual Activity  . Alcohol use: No  . Drug use: No  . Sexual activity: Yes    Partners: Male    Birth control/protection: None  Lifestyle  . Physical activity:    Days per week: Not on file    Minutes per session: Not on file  . Stress: Not on file  Relationships  . Social  connections:    Talks on phone: Not on file    Gets together: Not on file    Attends religious service: Not on file    Active member of club or organization: Not on file    Attends meetings of clubs or organizations: Not on file    Relationship status: Not on file  . Intimate partner violence:    Fear of current or ex partner: Not on file    Emotionally abused: Not on file    Physically abused: Not on file    Forced sexual activity: Not on file  Other Topics Concern  . Not on file  Social History Narrative   Originally from [place].   Has lived in area for 14 years.   3 children.   Works as a Marine scientist for [employer].     The PMH, PSH, Social History, Family History, Medications, and allergies have been reviewed in [facility], and have been updated if relevant.   Review of Systems  Constitutional: Negative for appetite change, chills, fatigue, fever and unexpected weight change.  HENT: Negative.   Eyes: Negative.   Respiratory: Negative.   Cardiovascular: Negative for chest pain, palpitations and leg swelling.  Gastrointestinal: Negative.   Endocrine: Negative for cold intolerance, heat intolerance, polydipsia, polyphagia and polyuria.  Genitourinary: Negative.   Musculoskeletal: Negative for myalgias, neck pain and neck stiffness.  Skin: Negative.   Neurological: Negative.   Hematological: Negative.   Psychiatric/Behavioral: Negative.   All other systems reviewed and are negative.      Objective:    BP 104/68 (BP Location: Left Arm, Patient Position: Sitting, Cuff Size: Normal)   Pulse 71   Temp 98.4 F (36.9 C) (Oral)   Ht 5' 2.5" (1.588 m)   Wt 144 lb 9.6 oz (65.6 kg)   LMP 05/31/2018 (Exact Date)   SpO2 99%   BMI 26.03 kg/m  Wt Readings from Last 3 Encounters:  08/11/18 144 lb 9.6 oz (65.6 kg)  09/01/17 138 lb (62.6 kg)  08/11/17 148 lb 6.4 oz (67.3 kg)    General:  Well-developed,well-nourished,in no acute distress; alert,appropriate and cooperative throughout  examination Head:  normocephalic and atraumatic.   Eyes:  vision grossly intact, PERRL Ears:  R ear normal and L ear normal externally, TMs clear bilaterally Nose:  no external deformity.   Mouth:  good dentition.   Neck:  No deformities, masses, or  tenderness noted. Breasts:  No mass, nodules, thickening, tenderness, bulging, retraction, inflamation, nipple discharge or skin changes noted.   Lungs:  Normal respiratory effort, chest expands symmetrically. Lungs are clear to auscultation, no crackles or wheezes. Heart:  Normal rate and regular rhythm. S1 and S2 normal without gallop, murmur, click, rub or other extra sounds. Abdomen:  Bowel sounds positive,abdomen soft and non-tender without masses, organomegaly or hernias noted. Rectal:  no external abnormalities.   Genitalia:  Pelvic Exam:        External: normal female genitalia without lesions or masses        Vagina: normal without lesions or masses        Cervix: normal without lesions or masses        Adnexa: normal bimanual exam without masses or fullness        Uterus: normal by palpation        Pap smear: performed Msk:  No deformity or scoliosis noted of thoracic or lumbar spine.   Extremities:  No clubbing, cyanosis, edema, or deformity noted with normal full range of motion of all joints.   Neurologic:  alert & oriented X3 and gait normal.   Skin:  Intact without suspicious lesions or rashes Cervical Nodes:  No lymphadenopathy noted Axillary Nodes:  No palpable lymphadenopathy Psych:  Cognition and judgment appear intact. Alert and cooperative with normal attention span and concentration. No apparent delusions, illusions, hallucinations        Assessment & Plan:   Well woman exam with routine gynecological exam  Well woman exam without gynecological exam - Plan: Comp Met (CMET), CBC w/Diff, Lipid Profile, TSH  Gastroesophageal reflux disease, esophagitis presence not specified - Plan: Comp Met (CMET), CBC w/Diff,  TSH  Screening for lipoid disorders - Plan: Lipid Profile No follow-ups on file.

## 2018-08-13 ENCOUNTER — Telehealth: Payer: Self-pay

## 2018-08-13 LAB — PAP, TP IMAGING W/ HPV RNA, RFLX HPV TYPE 16,18/45: HPV DNA High Risk: NOT DETECTED

## 2018-08-13 LAB — C. TRACHOMATIS/N. GONORRHOEAE RNA
C. trachomatis RNA, TMA: NOT DETECTED
N. gonorrhoeae RNA, TMA: NOT DETECTED

## 2018-08-13 LAB — PAP, TP IMAGING W/ CT/GC AND W/ HPV RNA, RFLX HPV TYPE 16/18

## 2018-08-13 NOTE — Telephone Encounter (Signed)
Copied from [place] [id number]. Topic: General - Other >> Aug 13, 2018 10:44 AM Marin Olp L wrote: Reason for CRM: Patient calling b/c she does not see a "hemoglobin A1C" test on her lab results from 08/11/2018. Wants to know if this test was forgotten and believes this is a test and it's not 2 separate tests.

## 2018-08-13 NOTE — Telephone Encounter (Signed)
Yes okay to add on.

## 2018-08-13 NOTE — Telephone Encounter (Signed)
Spoke with pt. She requests her A1C checked. We didn't do A1C on 08/11/18. Please advise, can we add on the order?

## 2018-08-13 NOTE — Telephone Encounter (Signed)
Add on order for A1C fax waiting for order.

## 2018-08-15 LAB — COMPREHENSIVE METABOLIC PANEL
AG Ratio: 1.3 (calc) (ref 1.0–2.5)
ALBUMIN MSPROF: 4.2 g/dL (ref 3.6–5.1)
ALT: 17 U/L (ref 6–29)
AST: 16 U/L (ref 10–35)
Alkaline phosphatase (APISO): 91 U/L (ref 33–115)
BUN: 10 mg/dL (ref 7–25)
CHLORIDE: 103 mmol/L (ref 98–110)
CO2: 30 mmol/L (ref 20–32)
CREATININE: 0.9 mg/dL (ref 0.50–1.10)
Calcium: 10.1 mg/dL (ref 8.6–10.2)
GLOBULIN: 3.2 g/dL (ref 1.9–3.7)
GLUCOSE: 87 mg/dL (ref 65–99)
Potassium: 3.7 mmol/L (ref 3.5–5.3)
Sodium: 139 mmol/L (ref 135–146)
Total Bilirubin: 1.5 mg/dL — ABNORMAL HIGH (ref 0.2–1.2)
Total Protein: 7.4 g/dL (ref 6.1–8.1)

## 2018-08-15 LAB — CBC WITH DIFFERENTIAL/PLATELET
Absolute Monocytes: 281 cells/uL (ref 200–950)
BASOS PCT: 0.5 %
Basophils Absolute: 19 cells/uL (ref 0–200)
Eosinophils Absolute: 89 cells/uL (ref 15–500)
Eosinophils Relative: 2.4 %
HCT: 41.5 % (ref 35.0–45.0)
Hemoglobin: 13.5 g/dL (ref 11.7–15.5)
Lymphs Abs: 1351 cells/uL (ref 850–3900)
MCH: 28.2 pg (ref 27.0–33.0)
MCHC: 32.5 g/dL (ref 32.0–36.0)
MCV: 86.6 fL (ref 80.0–100.0)
MONOS PCT: 7.6 %
MPV: 11.4 fL (ref 7.5–12.5)
Neutro Abs: 1961 cells/uL (ref 1500–7800)
Neutrophils Relative %: 53 %
PLATELETS: 156 10*3/uL (ref 140–400)
RBC: 4.79 10*6/uL (ref 3.80–5.10)
RDW: 11.9 % (ref 11.0–15.0)
TOTAL LYMPHOCYTE: 36.5 %
WBC: 3.7 10*3/uL — ABNORMAL LOW (ref 3.8–10.8)

## 2018-08-15 LAB — LIPID PANEL
CHOL/HDL RATIO: 2.9 (calc) (ref <5.0)
Cholesterol: 182 mg/dL (ref <200)
HDL: 63 mg/dL (ref >50)
LDL CHOLESTEROL (CALC): 109 mg/dL — AB
NON-HDL CHOLESTEROL (CALC): 119 mg/dL (ref <130)
Triglycerides: 34 mg/dL (ref <150)

## 2018-08-15 LAB — TEST AUTHORIZATION

## 2018-08-15 LAB — VITAMIN D 25 HYDROXY (VIT D DEFICIENCY, FRACTURES): Vit D, 25-Hydroxy: 52 ng/mL (ref 30–100)

## 2018-08-15 LAB — HEMOGLOBIN A1C W/OUT EAG: Hgb A1c MFr Bld: 5.1 % of total Hgb (ref <5.7)

## 2018-08-15 LAB — TSH: TSH: 0.74 mIU/L

## 2018-08-24 ENCOUNTER — Ambulatory Visit: Payer: Self-pay | Admitting: *Deleted

## 2018-08-24 DIAGNOSIS — R002 Palpitations: Secondary | ICD-10-CM

## 2018-08-24 NOTE — Telephone Encounter (Signed)
Referral entered  

## 2018-08-24 NOTE — Telephone Encounter (Addendum)
Pt called with her "heart jumping" on the left side,  no pain, no shortness of breath, no dizziness, no weakness, no tingling or numbness. Pt stated not palpitations. She had had this in the past. She stated that at the last visit, she was to call and let her provider know that she was again having this feeling of her "heart jumping". She understands that if she starts having any of the symptoms mentioned above to go to ED or call 911, including sob, dizziness, nausea,  Chest pain, weakness, or sweating. Pt voiced understanding.  Will notify her provider and ask for a call back. Routing to flow at [facility] [place]. No protocol.  Answer Assessment - Initial Assessment Questions 1. DESCRIPTION: "Please describe your heart rate or heart beat that you are having" (e.g., fast/slow, regular/irregular, skipped or extra beats, "palpitations")     Jumps once and a while 2. ONSET: "When did it start?" (Minutes, hours or days)      yesterday 3. DURATION: "How long does it last" (e.g., seconds, minutes, hours)     A while 4. PATTERN "Does it come and go, or has it been constant since it started?"  "Does it get worse with exertion?"   "Are you feeling it now?"     Comes and goes. Feeling good right now 5. TAP: "Using your hand, can you tap out what you are feeling on a chair or table in front of you, so that I can hear?" (Note: not all patients can do this)       no 6. HEART RATE: "Can you tell me your heart rate?" "How many beats in 15 seconds?"  (Note: not all patients can do this)       no 7. RECURRENT SYMPTOM: "Have you ever had this before?" If so, ask: "When was the last time?" and "What happened that time?"      Yes,  Saw a cardiologist. Asked to wear Holter monitor but refused. 8. CAUSE: "What do you think is causing the palpitations?"    Not sure 9. CARDIAC HISTORY: "Do you have any history of heart disease?" (e.g., heart attack, angina, bypass surgery, angioplasty, arrhythmia)    no 10. OTHER SYMPTOMS: "Do you have any other symptoms?" (e.g., dizziness, chest pain, sweating, difficulty breathing)       no 11. PREGNANCY: "Is there any chance you are pregnant?" "When was your last menstrual period?"       No. LMP irregular period (last was in 05/31/18.  Protocols used: HEART RATE AND HEARTBEAT QUESTIONS-A-AH

## 2018-08-24 NOTE — Telephone Encounter (Signed)
Left vm for the to call back, need to inform that referral for cardiology entered by [facility].

## 2018-08-24 NOTE — Addendum Note (Signed)
Addended by: Wilfred Lacy L on: 08/24/2018 11:03 AM   Modules accepted: Orders

## 2018-08-24 NOTE — Telephone Encounter (Signed)
Please advise, Dr. Deborra Medina is out of the office this week.

## 2018-08-26 NOTE — Telephone Encounter (Signed)
Left vm for the pt to call back.  

## 2018-08-26 NOTE — Telephone Encounter (Signed)
PEC nurse inform the pt.

## 2018-09-15 ENCOUNTER — Encounter: Payer: 59 | Admitting: Family Medicine

## 2018-10-27 ENCOUNTER — Ambulatory Visit: Payer: No Typology Code available for payment source | Admitting: Cardiovascular Disease

## 2018-10-29 ENCOUNTER — Telehealth: Payer: Self-pay | Admitting: Cardiology

## 2018-10-29 NOTE — Telephone Encounter (Signed)
Called patient to reschedule her appointment due to coronavirus pandemic.  The patient was referred for evaluation of palpitations.  According to her office notes from PCP she has not had any chest pain, SOB, or syncope.   Unable to reach patient but left message on her answering machine.

## 2018-10-30 ENCOUNTER — Ambulatory Visit: Payer: No Typology Code available for payment source | Admitting: Cardiology

## 2018-10-30 NOTE — Telephone Encounter (Signed)
Spoke with the patient, she was feeling fine, she stated her palpitations were unchanged. I advised her if they worsened to contact our office. She had rescheduled her appointment in June. She had no further questions.

## 2018-12-25 ENCOUNTER — Other Ambulatory Visit: Payer: Self-pay | Admitting: Family Medicine

## 2019-01-06 ENCOUNTER — Other Ambulatory Visit: Payer: Self-pay | Admitting: Family Medicine

## 2019-01-06 DIAGNOSIS — Z1231 Encounter for screening mammogram for malignant neoplasm of breast: Secondary | ICD-10-CM

## 2019-01-10 ENCOUNTER — Encounter: Payer: Self-pay | Admitting: Cardiology

## 2019-01-10 NOTE — Progress Notes (Signed)
Virtual Visit via Video Note   This visit type was conducted due to national recommendations for restrictions regarding the COVID-19 Pandemic (e.g. social distancing) in an effort to limit this patient's exposure and mitigate transmission in our community.  Due to her co-morbid illnesses, this patient is at least at moderate risk for complications without adequate follow up.  This format is felt to be most appropriate for this patient at this time.  All issues noted in this document were discussed and addressed.  A limited physical exam was performed with this format.  Please refer to the patient's chart for her consent to telehealth for [facility].  Evaluation Performed:  Follow-up visit  This visit type was conducted due to national recommendations for restrictions regarding the COVID-19 Pandemic (e.g. social distancing).  This format is felt to be most appropriate for this patient at this time.  All issues noted in this document were discussed and addressed.  No physical exam was performed (except for noted visual exam findings with Video Visits).  Please refer to the patient's chart (MyChart message for video visits and phone note for telephone visits) for the patient's consent to telehealth for [facility].  Date:  01/11/2019   ID:  Jennifer Ware, DOB February 19, 1971, MRN [id number]  Patient Location:  Home  Provider location:   [place]  PCP:  Lucille Passy, MD  Cardiologist:  Fransico Him, MD Electrophysiologist:  None   Chief Complaint:  Palpitations  History of Present Illness:    Jennifer Ware is a 48 y.o. female who presents via audio/video conferencing for a telehealth visit today.    This is a 48yo AAF with a history of GERD and HTN who was seen remotely by Cardiology in 2-15 for palpitations at that time as well.  It was felt that they were likely PVCs but she never wore a monitor.   She tells me that she has had palpitations all her life but about 3 months ago she had  a very severe episode of pounding in her chest and she had a hard time sleeping at night.  She has had some intermittently since then but nothing that is been really severe like 3 months ago.  She denies any chest pain or pressure, SOB, DOE, PND, orthopnea, LE edema, dizziness or syncope. She is compliant with her meds and is tolerating meds with no SE.    The patient does not have symptoms concerning for COVID-19 infection (fever, chills, cough, or new shortness of breath).    Prior CV studies:   The following studies were reviewed today:  none  Past Medical History:  Diagnosis Date   Abdominal pain 10/29/2011   Amenorrhea 10/22/2011   Anxiety    Chronic UTI 12/24/2011   Dyspareunia 04/19/2014   Ear discomfort 07/18/2011   GERD (gastroesophageal reflux disease)    HTN (hypertension) 04/25/2014   Hx gestational diabetes    Irregular periods/menstrual cycles 03/04/2014   Knee crepitus 07/27/2014   Low back pain 06/07/2011   Osteoarthritis of both knees 08/07/2016   Otitis externa 06/07/2011   Palpitations    Sciatica of left side 09/01/2017   Sensation of feeling cold 07/27/2014   SI (sacroiliac) joint dysfunction 09/01/2017   Thyroid disease    Viral URI with cough 09/07/2013   Past Surgical History:  Procedure Laterality Date   BREAST BIOPSY Right 08/24/2014   negative, u/s core   CESAREAN SECTION     x3   HERNIA REPAIR  umbilical   UMBILICAL HERNIA REPAIR       Current Meds  Medication Sig   AMBULATORY NON FORMULARY MEDICATION Thigh-high, medium compression, graduated compression stockings. Apply to lower extremities. [email or web address], Zippered Compression Stockings, medium circ, long length   Ascorbic Acid (VITAMIN C) 1000 MG tablet Take 1,000 mg by mouth daily.   BIOTIN 5000 PO Take by mouth daily.   Calcium 600-200 MG-UNIT per tablet Take 2 tablets by mouth daily.   Cranberry 1000 MG CAPS Take 500 mg by mouth as needed.     cyanocobalamin 1000 MCG tablet Take 100 mcg by mouth daily.   Diclofenac Sodium (PENNSAID) 2 % SOLN Place 1 application onto the skin 2 (two) times daily.   Fluocinolone Acetonide 0.01 % OIL PLACE 2 DROPS TO AFFECTED EAR, NO MORE THAN 1 WEEK AT A TIME   Ginkgo Biloba 40 MG TABS Take 1 tablet by mouth daily.   glucosamine-chondroitin 500-400 MG tablet Take 1 tablet by mouth 2 (two) times daily.   Lecithin 1200 MG CAPS Take 1 capsule by mouth as needed.    meloxicam (MOBIC) 15 MG tablet Take 1 tablet (15 mg total) by mouth daily.   Multiple Vitamins-Minerals (ANTIOXIDANT PO) Take by mouth daily.   nitrofurantoin, macrocrystal-monohydrate, (MACROBID) 100 MG capsule Take as directed for UTI suppression     Allergies:   Flagyl [metronidazole] and Sulfa antibiotics   Social History   Tobacco Use   Smoking status: Never Smoker   Smokeless tobacco: Never Used  Substance Use Topics   Alcohol use: No   Drug use: No     Family Hx: The patient's family history includes Asthma in her father. There is no history of Colon cancer or Breast cancer.  ROS:   Please see the history of present illness.     All other systems reviewed and are negative.   Labs/Other Tests and Data Reviewed:    Recent Labs: 08/11/2018: ALT 17; BUN 10; Creat 0.90; Hemoglobin 13.5; Platelets 156; Potassium 3.7; Sodium 139; TSH 0.74   Recent Lipid Panel Lab Results  Component Value Date/Time   CHOL 182 08/11/2018 12:13 PM   TRIG 34 08/11/2018 12:13 PM   HDL 63 08/11/2018 12:13 PM   CHOLHDL 2.9 08/11/2018 12:13 PM   LDLCALC 109 (H) 08/11/2018 12:13 PM    Wt Readings from Last 3 Encounters:  08/11/18 144 lb 9.6 oz (65.6 kg)  09/01/17 138 lb (62.6 kg)  08/11/17 148 lb 6.4 oz (67.3 kg)     Objective:    Vital Signs:  Ht 5\' 3"  (1.6 m)    BMI 25.61 kg/m    CONSTITUTIONAL:  Well nourished, well developed female in no acute distress.  EYES: anicteric MOUTH: oral mucosa is pink RESPIRATORY: Normal  respiratory effort, symmetric expansion CARDIOVASCULAR: No peripheral edema SKIN: No rash, lesions or ulcers MUSCULOSKELETAL: no digital cyanosis NEURO: Cranial Nerves II-XII grossly intact, moves all extremities PSYCH: Intact judgement and insight.  A&O x 3, Mood/affect appropriate   ASSESSMENT & PLAN:    1.  Palpitations - she has had these in the past and have been felt to be palpitations but has never worn a heart monitor.  I will get an event monitor.   2.  Hypertension - her BP is controlled at home. She has not been on any antihypertensive meds.    3.  COVID-19 Education:The signs and symptoms of COVID-19 were discussed with the patient and how to seek care for testing (follow up with PCP or  arrange E-visit).  The importance of social distancing was discussed today.  Patient Risk:   After full review of this patient's clinical status, I feel that they are at least moderate risk at this time.  Time:   Today, I have spent 15 minutes directly with the patient on video discussing medical problems including palpitations.  We also reviewed the symptoms of COVID 19 and the ways to protect against contracting the virus with telehealth technology.  I spent an additional 5 minutes reviewing patient's chart including old office notes.  Medication Adjustments/Labs and Tests Ordered: Current medicines are reviewed at length with the patient today.  Concerns regarding medicines are outlined above.  Tests Ordered: No orders of the defined types were placed in this encounter.  Medication Changes: No orders of the defined types were placed in this encounter.   Disposition:  Follow up prn  Signed, Fransico Him, MD  01/11/2019 8:32 AM    [facility]

## 2019-01-11 ENCOUNTER — Other Ambulatory Visit: Payer: Self-pay

## 2019-01-11 ENCOUNTER — Telehealth: Payer: Self-pay | Admitting: Family Medicine

## 2019-01-11 ENCOUNTER — Telehealth (INDEPENDENT_AMBULATORY_CARE_PROVIDER_SITE_OTHER): Payer: No Typology Code available for payment source | Admitting: Cardiology

## 2019-01-11 ENCOUNTER — Encounter: Payer: Self-pay | Admitting: Cardiology

## 2019-01-11 ENCOUNTER — Ambulatory Visit: Payer: No Typology Code available for payment source | Admitting: Cardiology

## 2019-01-11 VITALS — Ht 63.0 in

## 2019-01-11 DIAGNOSIS — Z7189 Other specified counseling: Secondary | ICD-10-CM

## 2019-01-11 DIAGNOSIS — R002 Palpitations: Secondary | ICD-10-CM

## 2019-01-11 DIAGNOSIS — I10 Essential (primary) hypertension: Secondary | ICD-10-CM | POA: Insufficient documentation

## 2019-01-11 MED ORDER — FLUOCINOLONE ACETONIDE 0.01 % OT OIL: TOPICAL_OIL | OTIC | 3 refills | Status: DC

## 2019-01-11 NOTE — Telephone Encounter (Signed)
eRx sent to pharmacy on file

## 2019-01-11 NOTE — Patient Instructions (Signed)
Medication Instructions:  Your physician recommends that you continue on your current medications as directed. Please refer to the Current Medication list given to you today.  If you need a refill on your cardiac medications before your next appointment, please call your pharmacy.   Lab work: None If you have labs (blood work) drawn today and your tests are completely normal, you will receive your results only by: Marland Kitchen MyChart Message (if you have MyChart) OR . A paper copy in the mail If you have any lab test that is abnormal or we need to change your treatment, we will call you to review the results.  Testing/Procedures: Your physician has recommended that you wear an event monitor. Event monitors are medical devices that record the heart's electrical activity. Doctors most often [place] these monitors to diagnose arrhythmias. Arrhythmias are problems with the speed or rhythm of the heartbeat. The monitor is a small, portable device. You can wear one while you do your normal daily activities. This is usually used to diagnose what is causing palpitations/syncope (passing out).  Follow-Up: As needed, pending results.

## 2019-01-11 NOTE — Telephone Encounter (Signed)
Copied from [place] [id number]. Topic: Quick Communication - See Telephone Encounter >> Jan 11, 2019 10:17 AM Vernona Rieger wrote: CRM for notification. See Telephone encounter for: 01/11/19. Patient said [facility] never received the script for Fluocinolone Acetonide 0.01 % OIL. She went to pick up her other medication and they did not have this one. Can this be re-sent?

## 2019-01-11 NOTE — Telephone Encounter (Signed)
Dr. Deborra Medina, please advise.

## 2019-01-14 ENCOUNTER — Telehealth: Payer: Self-pay | Admitting: Radiology

## 2019-01-14 NOTE — Telephone Encounter (Signed)
Tried calling patient to verify insurance/address and briefly go over the monitor her doctor ordered. Unable to leave a voicemail mailbox is full

## 2019-01-15 NOTE — Telephone Encounter (Signed)
Tried calling was unable to reach patient. No DPR on file to leave a msg

## 2019-01-18 NOTE — Telephone Encounter (Signed)
Follow up     Pt is returning call  She works nights and is sleeping most of the day    Please call back

## 2019-01-20 NOTE — Telephone Encounter (Signed)
Enrolled patient for a 30 day [employer] Event Monitor to be mailed. Brief insturctions were gone over with patient and she knows to expect the monitor to arrive in 3-4 days

## 2019-01-27 ENCOUNTER — Ambulatory Visit (INDEPENDENT_AMBULATORY_CARE_PROVIDER_SITE_OTHER): Payer: No Typology Code available for payment source

## 2019-01-27 ENCOUNTER — Encounter: Payer: Self-pay | Admitting: Cardiology

## 2019-01-27 DIAGNOSIS — R002 Palpitations: Secondary | ICD-10-CM | POA: Diagnosis not present

## 2019-02-05 ENCOUNTER — Telehealth: Payer: Self-pay | Admitting: Cardiology

## 2019-02-05 NOTE — Telephone Encounter (Signed)
New message:    [place] from [employer] calling stating this patient has a critical EKG.

## 2019-02-05 NOTE — Telephone Encounter (Signed)
14 beat run of vtach today at 9:24 am CST--nonsustained NSR underlying at 77 bpm VM left by [employer], did not reach pt.  Monitor was placed for palpitations after visit with Dr. Radford Pax 01/11/19. Called pt. Also left VM.  Notes say pt works nights, sleeps days. Awaiting faxed reports from [employer].

## 2019-02-08 NOTE — Telephone Encounter (Signed)
Spoke with the patient, she was sleeping at the time of the event.

## 2019-02-08 NOTE — Telephone Encounter (Signed)
Maude Leriche spoke with Dr, Irish Lack (DOD), no changes.

## 2019-03-01 ENCOUNTER — Other Ambulatory Visit: Payer: Self-pay

## 2019-03-01 ENCOUNTER — Ambulatory Visit
Admission: RE | Admit: 2019-03-01 | Discharge: 2019-03-01 | Disposition: A | Discharge status: Home / Self Care | Payer: No Typology Code available for payment source | Source: Ambulatory Visit | Attending: Family Medicine | Admitting: Family Medicine

## 2019-03-01 DIAGNOSIS — Z1231 Encounter for screening mammogram for malignant neoplasm of breast: Secondary | ICD-10-CM | POA: Insufficient documentation

## 2019-03-02 ENCOUNTER — Other Ambulatory Visit: Payer: Self-pay | Admitting: Cardiology

## 2019-03-02 DIAGNOSIS — R002 Palpitations: Secondary | ICD-10-CM

## 2019-03-03 ENCOUNTER — Telehealth: Payer: Self-pay

## 2019-03-03 DIAGNOSIS — I519 Heart disease, unspecified: Secondary | ICD-10-CM

## 2019-03-03 NOTE — Telephone Encounter (Signed)
Attempted outreach to Pt.  No DPR on file.  Advised monitor results available.  Advised to call office.  Placed orders for ECHO and cardiac CT.  Sent request for cardiac CT to [facility].

## 2019-03-03 NOTE — Telephone Encounter (Signed)
-----   Message from Sueanne Margarita, MD sent at 03/02/2019  9:25 PM EDT ----- Please let patient know that heart monitor showed NSR with extra heart beats from the bottom of her heart.  Please get a coronary CTA with morph to rule out CAD and 2D echo to assess LVF.  Please find out what her BP has been running.

## 2019-03-05 NOTE — Telephone Encounter (Signed)
Per pt is asymptomatic at this time declines needing any meds at this time ./cy

## 2019-03-05 NOTE — Telephone Encounter (Signed)
Pt aware of recommendations and per pt B/P has been running good last recording was 117/72 .Jennifer Ware

## 2019-03-05 NOTE — Telephone Encounter (Signed)
Please find out if she wants to try low dose lopressor to suppress PVCs - is she is not symptomatic then would not treat.  These are benign

## 2019-03-05 NOTE — Telephone Encounter (Signed)
Lm to call back ./cy 

## 2019-03-14 ENCOUNTER — Telehealth (HOSPITAL_COMMUNITY): Payer: Self-pay | Admitting: Emergency Medicine

## 2019-03-14 NOTE — Telephone Encounter (Signed)
Left message on voicemail with name and callback number Teryn Gust RN Navigator Cardiac Imaging [facility] [phone] Office [phone] Cell  

## 2019-03-15 ENCOUNTER — Other Ambulatory Visit: Payer: Self-pay

## 2019-03-15 ENCOUNTER — Ambulatory Visit (HOSPITAL_COMMUNITY)
Admission: RE | Admit: 2019-03-15 | Discharge: 2019-03-15 | Disposition: A | Discharge status: Home / Self Care | Payer: No Typology Code available for payment source | Source: Ambulatory Visit | Attending: Cardiology | Admitting: Cardiology

## 2019-03-15 ENCOUNTER — Ambulatory Visit (HOSPITAL_COMMUNITY): Payer: No Typology Code available for payment source

## 2019-03-15 DIAGNOSIS — I519 Heart disease, unspecified: Secondary | ICD-10-CM

## 2019-03-15 MED ORDER — NITROGLYCERIN 0.4 MG SL SUBL
0.8000 mg | SUBLINGUAL_TABLET | Freq: Once | SUBLINGUAL | Status: AC
  Administered 2019-03-15: 0.8 mg via SUBLINGUAL

## 2019-03-15 MED ORDER — IOHEXOL 350 MG/ML SOLN
80.0000 mL | Freq: Once | INTRAVENOUS | Status: AC | PRN
  Administered 2019-03-15: 15:00:00 80 mL via INTRAVENOUS

## 2019-03-15 MED ORDER — NITROGLYCERIN 0.4 MG SL SUBL
SUBLINGUAL_TABLET | SUBLINGUAL | Status: AC
  Filled 2019-03-15: qty 2

## 2019-03-15 MED ORDER — METOPROLOL TARTRATE 5 MG/5ML IV SOLN
INTRAVENOUS | Status: AC
  Filled 2019-03-15: qty 5

## 2019-03-15 MED ORDER — METOPROLOL TARTRATE 5 MG/5ML IV SOLN
5.0000 mg | INTRAVENOUS | Status: DC | PRN
  Administered 2019-03-15: 15:00:00 5 mg via INTRAVENOUS

## 2019-03-16 ENCOUNTER — Telehealth: Payer: Self-pay | Admitting: Family Medicine

## 2019-03-16 NOTE — Telephone Encounter (Signed)
Relation to pt: self  Call back number: [phone]    Reason for call:  Patient would like to pick up a copy of cardiology referral placed 08/24/2018, patient would like to pick up referral 03/17/2019, please advise when ready for pick up

## 2019-03-17 ENCOUNTER — Other Ambulatory Visit: Payer: Self-pay

## 2019-03-17 ENCOUNTER — Ambulatory Visit (HOSPITAL_COMMUNITY): Payer: No Typology Code available for payment source | Attending: Internal Medicine

## 2019-03-17 DIAGNOSIS — I519 Heart disease, unspecified: Secondary | ICD-10-CM

## 2019-03-18 ENCOUNTER — Telehealth: Payer: Self-pay

## 2019-03-18 NOTE — Telephone Encounter (Signed)
Patient returned your call.

## 2019-03-18 NOTE — Telephone Encounter (Signed)
The patient has been notified of the result and verbalized understanding.  All questions (if any) were answered. Frederik Schmidt, RN 03/18/2019 10:50 AM

## 2019-03-18 NOTE — Telephone Encounter (Signed)
LMOVM that copy of referral is at the front/thx dmf

## 2019-03-18 NOTE — Telephone Encounter (Signed)
Notes recorded by Frederik Schmidt, RN on 03/18/2019 at 9:39 AM EDT  lpmtcb 8/6  ------

## 2019-03-18 NOTE — Telephone Encounter (Signed)
Pt will pick up referral tomorrow

## 2019-03-18 NOTE — Telephone Encounter (Signed)
Pt requesting copy

## 2019-03-18 NOTE — Telephone Encounter (Signed)
-----   Message from Sueanne Margarita, MD sent at 03/17/2019  5:27 PM EDT ----- Echo showed normal LVF with mildly thickened heart muscle

## 2019-03-19 ENCOUNTER — Telehealth: Payer: Self-pay

## 2019-03-19 DIAGNOSIS — R002 Palpitations: Secondary | ICD-10-CM

## 2019-03-19 DIAGNOSIS — I519 Heart disease, unspecified: Secondary | ICD-10-CM

## 2019-03-19 DIAGNOSIS — I10 Essential (primary) hypertension: Secondary | ICD-10-CM

## 2019-03-19 DIAGNOSIS — Z79899 Other long term (current) drug therapy: Secondary | ICD-10-CM

## 2019-03-19 NOTE — Telephone Encounter (Signed)
-----   Message from Sueanne Margarita, MD sent at 03/19/2019 12:03 PM EDT ----- coronary CT did not show any CAD in the proximal and mid vessels and no calcium.  Please get a cardiac MRI with gad to assess for infiltrative dz, RV dysplasia

## 2019-03-19 NOTE — Telephone Encounter (Signed)
Spoke with the pt re: her CT results.Marland Kitchen and she is going to think about the MRI.Marland Kitchen she will review her results in My Chart and let us know Monday 03/22/19 if she would like to proceed... after explaining her results and the MRI with her she was still very reluctant... she declined televisit with Dr. Radford Pax to talk about it. Will call back today if she as any further questions.

## 2019-03-23 NOTE — Telephone Encounter (Signed)
New Message     Patient returning your call to talk about getting MRI done.

## 2019-03-24 ENCOUNTER — Telehealth: Payer: Self-pay

## 2019-03-24 NOTE — Telephone Encounter (Signed)
Spoke with the pt again and she says she cannot see Dr. Theodosia Blender notes about her CT on MY Chart.... I resent her the results along with Dr. Theodosia Blender recommendations for her review since she is reluctant to have the MRI... she will review and call us this afternoon if she would like [place] to order the recommended MRI.

## 2019-03-24 NOTE — Telephone Encounter (Signed)
Copied from Terry [phone]. Topic: General - Other >> Mar 24, 2019 11:46 AM Keene Breath wrote: Reason for CRM: Patient called to ask dr. Deborra Medina to review her CT Scan because the patient has some questions.  Please give the patient a call as soon as possible [phone]

## 2019-03-25 NOTE — Telephone Encounter (Signed)
Pt called back and agrees to having a cardiac MRI.Marland Kitchen Reviewed procedure with her and she agrees.. will call back if she has any further problems.

## 2019-03-25 NOTE — Telephone Encounter (Signed)
Follow up    Patient is calling back about MRI

## 2019-03-25 NOTE — Addendum Note (Signed)
Addended by: Stephani Police on: 03/25/2019 11:55 AM   Modules accepted: Orders

## 2019-03-29 NOTE — Telephone Encounter (Signed)
LMOVM asking pt what questions she has that I can give to Dr. Deborra Medina about her CT/PEC ok to collect questions if pt calls/thx dmf

## 2019-03-31 ENCOUNTER — Telehealth: Payer: Self-pay | Admitting: *Deleted

## 2019-03-31 ENCOUNTER — Encounter: Payer: Self-pay | Admitting: *Deleted

## 2019-03-31 NOTE — Telephone Encounter (Signed)
Left message regarding appointment for Cardiac MRI ordered by Dr. Valora Corporal 04/09/19 at 8:00 am--will also mail instruction letter to patient

## 2019-04-08 ENCOUNTER — Telehealth (HOSPITAL_COMMUNITY): Payer: Self-pay | Admitting: Emergency Medicine

## 2019-04-08 NOTE — Telephone Encounter (Signed)
Please get her cardiac MRI rescheduled

## 2019-04-08 NOTE — Telephone Encounter (Signed)
Calling to discuss cMR for tomorrow 8/28, pt states she told someone yesterday that she cannot make it to tomorrows appt and requested to reschedule, however she is still on the schedule for tomorrow.  Prefers Monday mornings, cannot do 21st.  Marchia Bond RN Navigator Cardiac Imaging [facility] [phone] Office  [phone] Cell

## 2019-04-09 ENCOUNTER — Ambulatory Visit (HOSPITAL_COMMUNITY): Admission: RE | Admit: 2019-04-09 | Payer: No Typology Code available for payment source | Source: Ambulatory Visit

## 2019-05-06 ENCOUNTER — Telehealth (HOSPITAL_COMMUNITY): Payer: Self-pay | Admitting: Emergency Medicine

## 2019-05-06 NOTE — Telephone Encounter (Signed)
Left message on voicemail with name and callback number Marchia Bond RN Navigator Cardiac Imaging [facility] [phone] Office [phone] Cell  Requested lab work prior to [facility]

## 2019-05-07 ENCOUNTER — Other Ambulatory Visit: Payer: Self-pay

## 2019-05-07 ENCOUNTER — Other Ambulatory Visit: Payer: No Typology Code available for payment source | Admitting: *Deleted

## 2019-05-07 DIAGNOSIS — Z79899 Other long term (current) drug therapy: Secondary | ICD-10-CM

## 2019-05-07 DIAGNOSIS — I519 Heart disease, unspecified: Secondary | ICD-10-CM

## 2019-05-07 DIAGNOSIS — I10 Essential (primary) hypertension: Secondary | ICD-10-CM

## 2019-05-07 DIAGNOSIS — R002 Palpitations: Secondary | ICD-10-CM

## 2019-05-07 LAB — BASIC METABOLIC PANEL
BUN/Creatinine Ratio: 14 (ref 9–23)
BUN: 12 mg/dL (ref 6–24)
CO2: 25 mmol/L (ref 20–29)
Calcium: 10.1 mg/dL (ref 8.7–10.2)
Chloride: 101 mmol/L (ref 96–106)
Creatinine, Ser: 0.88 mg/dL (ref 0.57–1.00)
GFR calc Af Amer: 89 mL/min/{1.73_m2} (ref >59)
GFR calc non Af Amer: 77 mL/min/{1.73_m2} (ref >59)
Glucose: 75 mg/dL (ref 65–99)
Potassium: 3.7 mmol/L (ref 3.5–5.2)
Sodium: 141 mmol/L (ref 134–144)

## 2019-05-10 ENCOUNTER — Ambulatory Visit (HOSPITAL_COMMUNITY)
Admission: RE | Admit: 2019-05-10 | Discharge: 2019-05-10 | Disposition: A | Discharge status: Home / Self Care | Payer: No Typology Code available for payment source | Source: Ambulatory Visit | Attending: Cardiology | Admitting: Cardiology

## 2019-05-10 ENCOUNTER — Other Ambulatory Visit: Payer: Self-pay

## 2019-05-10 DIAGNOSIS — I519 Heart disease, unspecified: Secondary | ICD-10-CM | POA: Insufficient documentation

## 2019-05-10 DIAGNOSIS — R002 Palpitations: Secondary | ICD-10-CM | POA: Diagnosis not present

## 2019-05-10 DIAGNOSIS — I10 Essential (primary) hypertension: Secondary | ICD-10-CM | POA: Insufficient documentation

## 2019-05-10 MED ORDER — GADOBUTROL 1 MMOL/ML IV SOLN
7.0000 mL | Freq: Once | INTRAVENOUS | Status: AC | PRN
  Administered 2019-05-10: 11:00:00 7 mL via INTRAVENOUS

## 2019-05-11 ENCOUNTER — Telehealth: Payer: Self-pay

## 2019-05-11 NOTE — Telephone Encounter (Signed)
-----   Message from Sueanne Margarita, MD sent at 05/10/2019 11:10 PM EDT ----- Normal cardiac MRI

## 2019-05-11 NOTE — Telephone Encounter (Signed)
Notes recorded by Frederik Schmidt, RN on 05/11/2019 at 8:29 AM EDT  lpmtcb 9/29  ------

## 2019-05-11 NOTE — Telephone Encounter (Signed)
Patient returning call.

## 2019-05-11 NOTE — Telephone Encounter (Signed)
The patient has been notified of the Cardiac MRI result and verbalized understanding.  All questions (if any) were answered. Frederik Schmidt, RN 05/11/2019 9:50 AM

## 2019-08-14 NOTE — Progress Notes (Signed)
Subjective:    Patient ID: Jennifer Ware, female    DOB: 14-Dec-1970, 49 y.o.   MRN: [id number]  Chief Complaint  Patient presents with  . Annual Exam    Pt would like a refill on the Diclofenac Sodium 2%.  Pt is Up todate on Pap smear and Mammogram    HPI Patient is in today for CPX.  Health Maintenance  Topic Date Due  . PAP SMEAR-Modifier  08/11/2021  . TETANUS/TDAP  08/07/2026  . INFLUENZA VACCINE  Completed  . HIV Screening  Completed   Last pap smear done by me on 07/25/18. Mammogram 03/01/19.  Depression screen [facility] 2/9 08/16/2019 08/11/2018 08/11/2017  Decreased Interest 0 0 0  Down, Depressed, Hopeless 0 0 0  PHQ - 2 Score 0 0 0  Altered sleeping 0 - -  Tired, decreased energy 0 - -  Change in appetite 0 - -  Feeling bad or failure about yourself  0 - -  Trouble concentrating 0 - -  Moving slowly or fidgety/restless 0 - -  Suicidal thoughts 0 - -  PHQ-9 Score 0 - -  Difficult doing work/chores Not difficult at all - -      Past Medical History:  Diagnosis Date  . Abdominal pain 10/29/2011  . Amenorrhea 10/22/2011  . Anxiety   . Chronic UTI 12/24/2011  . Dyspareunia 04/19/2014  . Ear discomfort 07/18/2011  . GERD (gastroesophageal reflux disease)   . HTN (hypertension) 04/25/2014  . Hx gestational diabetes   . Irregular periods/menstrual cycles 03/04/2014  . Knee crepitus 07/27/2014  . Low back pain 06/07/2011  . Osteoarthritis of both knees 08/07/2016  . Otitis externa 06/07/2011  . Palpitations   . Sciatica of left side 09/01/2017  . Sensation of feeling cold 07/27/2014  . SI (sacroiliac) joint dysfunction 09/01/2017  . Thyroid disease   . Viral URI with cough 09/07/2013    Past Surgical History:  Procedure Laterality Date  . BREAST BIOPSY Right 08/24/2014   negative, u/s core  . CESAREAN SECTION     x3  . HERNIA REPAIR     umbilical  . UMBILICAL HERNIA REPAIR      Family History  Problem Relation Age of Onset  . Asthma Father   . Colon cancer  Neg Hx   . Breast cancer Neg Hx     Social History   Socioeconomic History  . Marital status: Married    Spouse name: Not on file  . Number of children: 3  . Years of education: Not on file  . Highest education level: Not on file  Occupational History  . Occupation: LPN    Employer: [employer]  Tobacco Use  . Smoking status: Never Smoker  . Smokeless tobacco: Never Used  Substance and Sexual Activity  . Alcohol use: No  . Drug use: No  . Sexual activity: Yes    Partners: Male    Birth control/protection: None  Other Topics Concern  . Not on file  Social History Narrative   Originally from [place].   Has lived in area for 14 years.   3 children.   Works as a Marine scientist for [employer].     Social Determinants of Health   Financial Resource Strain:   . Difficulty of Paying Living Expenses: Not on file  Food Insecurity:   . Worried About Charity fundraiser in the Last Year: Not on file  . Ran Out of Food in the Last Year: Not on file  Transportation Needs:   . Film/video editor (Medical): Not on file  . Lack of Transportation (Non-Medical): Not on file  Physical Activity:   . Days of Exercise per Week: Not on file  . Minutes of Exercise per Session: Not on file  Stress:   . Feeling of Stress : Not on file  Social Connections:   . Frequency of Communication with Friends and Family: Not on file  . Frequency of Social Gatherings with Friends and Family: Not on file  . Attends Religious Services: Not on file  . Active Member of Clubs or Organizations: Not on file  . Attends Archivist Meetings: Not on file  . Marital Status: Not on file  Intimate Partner Violence:   . Fear of Current or Ex-Partner: Not on file  . Emotionally Abused: Not on file  . Physically Abused: Not on file  . Sexually Abused: Not on file    Outpatient Medications Prior to Visit  Medication Sig Dispense Refill  . AMBULATORY NON FORMULARY MEDICATION Thigh-high, medium compression,  graduated compression stockings. Apply to lower extremities. [email or web address], Zippered Compression Stockings, medium circ, long length 2 each 0  . Ascorbic Acid (VITAMIN C) 1000 MG tablet Take 1,000 mg by mouth daily.    Marland Kitchen BIOTIN 5000 PO Take by mouth daily.    . Calcium 600-200 MG-UNIT per tablet Take 2 tablets by mouth daily.    . Cranberry 1000 MG CAPS Take 500 mg by mouth as needed.     . cyanocobalamin 1000 MCG tablet Take 100 mcg by mouth daily.    . Ginkgo Biloba 40 MG TABS Take 1 tablet by mouth daily.    Marland Kitchen glucosamine-chondroitin 500-400 MG tablet Take 1 tablet by mouth 2 (two) times daily.    . Lecithin 1200 MG CAPS Take 1 capsule by mouth as needed.     . Multiple Vitamins-Minerals (ANTIOXIDANT PO) Take by mouth daily.    . Fluocinolone Acetonide 0.01 % OIL PLACE 2 DROPS TO AFFECTED EAR, NO MORE THAN 1 WEEK AT A TIME 20 mL 3  . meloxicam (MOBIC) 15 MG tablet Take 1 tablet (15 mg total) by mouth daily. 30 tablet 11  . nitrofurantoin, macrocrystal-monohydrate, (MACROBID) 100 MG capsule Take as directed for UTI suppression 30 capsule 4  . Diclofenac Sodium (PENNSAID) 2 % SOLN Place 1 application onto the skin 2 (two) times daily. 1 Bottle 3   No facility-administered medications prior to visit.    Allergies  Allergen Reactions  . Flagyl [Metronidazole]   . Sulfa Antibiotics     Review of Systems  Constitutional: Negative for fever and malaise/fatigue.  HENT: Negative for congestion and hearing loss.   Eyes: Negative.  Negative for blurred vision, discharge and redness.  Respiratory: Negative for cough and shortness of breath.   Cardiovascular: Negative for chest pain, palpitations and leg swelling.  Gastrointestinal: Negative.  Negative for abdominal pain and heartburn.  Genitourinary: Negative for dysuria.  Musculoskeletal: Negative for falls.       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  Skin: Negative for rash.  Neurological:  Negative.  Negative for loss of consciousness and headaches.  Endo/Heme/Allergies: Negative.  Does not bruise/bleed easily.       + hot flahes  Psychiatric/Behavioral: Negative.  Negative for depression.  All other systems reviewed and are negative.      Objective:    Physical Exam Vitals and nursing note reviewed.  Constitutional:      Appearance: Normal appearance. She is not ill-appearing.  HENT:     Head: Normocephalic and atraumatic.     Right Ear: External ear normal.     Left Ear: External ear normal.     Nose: Nose normal.  Eyes:     General:        Right eye: No discharge.        Left eye: No discharge.  Cardiovascular:     Rate and Rhythm: Normal rate and regular rhythm.     Heart sounds: Normal heart sounds.  Pulmonary:     Effort: Pulmonary effort is normal.     Breath sounds: No wheezing.  Abdominal:     Palpations: Abdomen is soft. There is no mass.     Tenderness: There is no abdominal tenderness. There is no guarding.  Musculoskeletal:        General: No swelling or tenderness. Normal range of motion.     Right lower leg: No edema.     Left lower leg: No edema.     Comments: + bilateral pes planus  Skin:    General: Skin is dry.  Neurological:     Mental Status: She is alert and oriented to person, place, and time.     Deep Tendon Reflexes: Reflexes normal.  Psychiatric:        Mood and Affect: Mood normal.        Behavior: Behavior normal.     BP 100/70 (BP Location: Left Arm, Patient Position: Sitting, Cuff Size: Normal)   Pulse 70   Temp (!) 95.9 F (35.5 C) (Temporal)   Ht 5' 3.75" (1.619 m)   Wt 152 lb (68.9 kg)   LMP 09/04/2018   SpO2 100%   BMI 26.30 kg/m  Wt Readings from Last 3 Encounters:  08/16/19 152 lb (68.9 kg)  08/11/18 144 lb 9.6 oz (65.6 kg)  09/01/17 138 lb (62.6 kg)     Lab Results  Component Value Date   WBC 3.7 (L) 08/11/2018   HGB 13.5 08/11/2018   HCT 41.5 08/11/2018   PLT 156 08/11/2018   GLUCOSE 75  05/07/2019   CHOL 182 08/11/2018   TRIG 34 08/11/2018   HDL 63 08/11/2018   LDLCALC 109 (H) 08/11/2018   ALT 17 08/11/2018   AST 16 08/11/2018   NA 141 05/07/2019   K 3.7 05/07/2019   CL 101 05/07/2019   CREATININE 0.88 05/07/2019   BUN 12 05/07/2019   CO2 25 05/07/2019   TSH 0.74 08/11/2018   HGBA1C 5.1 08/11/2018    Lab Results  Component Value Date   TSH 0.74 08/11/2018   Lab Results  Component Value Date   WBC 3.7 (L) 08/11/2018   HGB 13.5 08/11/2018   HCT 41.5 08/11/2018   MCV 86.6 08/11/2018   PLT 156 08/11/2018   Lab Results  Component Value Date   NA 141 05/07/2019   K 3.7 05/07/2019   CO2 25 05/07/2019   GLUCOSE 75 05/07/2019   BUN 12 05/07/2019   CREATININE 0.88 05/07/2019   BILITOT 1.5 (H) 08/11/2018   ALKPHOS 74 08/11/2017   AST 16 08/11/2018   ALT 17 08/11/2018   PROT 7.4 08/11/2018   ALBUMIN 4.6 08/11/2017   CALCIUM 10.1 05/07/2019   GFR 98.72 08/11/2017   Lab Results  Component Value Date   CHOL 182 08/11/2018   Lab Results  Component Value Date   HDL 63 08/11/2018   Lab Results  Component Value Date   LDLCALC 109 (H) 08/11/2018   Lab Results  Component Value Date   TRIG 34 08/11/2018   Lab Results  Component Value Date   CHOLHDL 2.9 08/11/2018   Lab Results  Component Value Date   HGBA1C 5.1 08/11/2018       Assessment & Plan:   Problem List Items Addressed This Visit      Active Problems   Well woman exam without gynecological exam    Reviewed preventive care protocols, scheduled due services, and updated immunizations Discussed nutrition, exercise, diet, and healthy lifestyle.       Bilateral foot pain   Relevant Orders   Ambulatory referral to Podiatry    Other Visit Diagnoses    Hyperlipidemia, unspecified hyperlipidemia type    -  Primary   Relevant Orders   Well woman- non DM- CBC   Well woman- non DM- CMET   Well woman- non DM- lipid   well woman- non DM- TSH   Hemoglobin A1c   Frequent UTI        Relevant Medications   nitrofurantoin, macrocrystal-monohydrate, (MACROBID) 100 MG capsule   Hot flashes       Relevant Orders   [place]   LH      I have discontinued Shelina Vanlue's Diclofenac Sodium. I am also having her maintain her Cranberry, Calcium, BIOTIN 5000 PO, Multiple Vitamins-Minerals (ANTIOXIDANT PO), cyanocobalamin, Ginkgo Biloba, glucosamine-chondroitin, Lecithin, vitamin C, AMBULATORY NON FORMULARY MEDICATION, meloxicam, Fluocinolone Acetonide, and nitrofurantoin (macrocrystal-monohydrate).  Meds ordered this encounter  Medications  . meloxicam (MOBIC) 15 MG tablet    Sig: Take 1 tablet (15 mg total) by mouth daily.    Dispense:  30 tablet    Refill:  11  . Fluocinolone Acetonide 0.01 % OIL    Sig: PLACE 2 DROPS TO AFFECTED EAR, NO MORE THAN 1 WEEK AT A TIME    Dispense:  20 mL    Refill:  3  . nitrofurantoin, macrocrystal-monohydrate, (MACROBID) 100 MG capsule    Sig: Take as directed for UTI suppression    Dispense:  30 capsule    Refill:  4   This visit occurred during the SARS-CoV-2 public health emergency.  Safety protocols were in place, including screening questions prior to the visit, additional usage of staff PPE, and extensive cleaning of exam room while observing appropriate contact time as indicated for disinfecting solutions.    Arnette Norris, MD

## 2019-08-16 ENCOUNTER — Other Ambulatory Visit: Payer: Self-pay

## 2019-08-16 ENCOUNTER — Encounter: Payer: Self-pay | Admitting: Family Medicine

## 2019-08-16 ENCOUNTER — Ambulatory Visit (INDEPENDENT_AMBULATORY_CARE_PROVIDER_SITE_OTHER): Payer: No Typology Code available for payment source | Admitting: Family Medicine

## 2019-08-16 VITALS — BP 100/70 | HR 70 | Temp 95.9°F | Ht 63.75 in | Wt 152.0 lb

## 2019-08-16 DIAGNOSIS — Z0000 Encounter for general adult medical examination without abnormal findings: Secondary | ICD-10-CM

## 2019-08-16 DIAGNOSIS — M79671 Pain in right foot: Secondary | ICD-10-CM

## 2019-08-16 DIAGNOSIS — E785 Hyperlipidemia, unspecified: Secondary | ICD-10-CM

## 2019-08-16 DIAGNOSIS — R232 Flushing: Secondary | ICD-10-CM | POA: Diagnosis not present

## 2019-08-16 DIAGNOSIS — M79672 Pain in left foot: Secondary | ICD-10-CM

## 2019-08-16 DIAGNOSIS — N390 Urinary tract infection, site not specified: Secondary | ICD-10-CM

## 2019-08-16 LAB — CBC WITH DIFFERENTIAL/PLATELET
Basophils Absolute: 0 10*3/uL (ref 0.0–0.1)
Basophils Relative: 0.3 % (ref 0.0–3.0)
Eosinophils Absolute: 0.1 10*3/uL (ref 0.0–0.7)
Eosinophils Relative: 1.4 % (ref 0.0–5.0)
HCT: 39.8 % (ref 36.0–46.0)
Hemoglobin: 12.9 g/dL (ref 12.0–15.0)
Lymphocytes Relative: 42.1 % (ref 12.0–46.0)
Lymphs Abs: 2.2 10*3/uL (ref 0.7–4.0)
MCHC: 32.3 g/dL (ref 30.0–36.0)
MCV: 88.9 fl (ref 78.0–100.0)
Monocytes Absolute: 0.3 10*3/uL (ref 0.1–1.0)
Monocytes Relative: 6.4 % (ref 3.0–12.0)
Neutro Abs: 2.6 10*3/uL (ref 1.4–7.7)
Neutrophils Relative %: 49.8 % (ref 43.0–77.0)
Platelets: 131 10*3/uL — ABNORMAL LOW (ref 150.0–400.0)
RBC: 4.48 Mil/uL (ref 3.87–5.11)
RDW: 13.4 % (ref 11.5–15.5)
WBC: 5.2 10*3/uL (ref 4.0–10.5)

## 2019-08-16 LAB — COMPREHENSIVE METABOLIC PANEL
ALT: 13 U/L (ref 0–35)
AST: 15 U/L (ref 0–37)
Albumin: 4.3 g/dL (ref 3.5–5.2)
Alkaline Phosphatase: 92 U/L (ref 39–117)
BUN: 13 mg/dL (ref 6–23)
CO2: 30 mEq/L (ref 19–32)
Calcium: 9.8 mg/dL (ref 8.4–10.5)
Chloride: 103 mEq/L (ref 96–112)
Creatinine, Ser: 0.82 mg/dL (ref 0.40–1.20)
GFR: 89.52 mL/min (ref >60.00)
Glucose, Bld: 92 mg/dL (ref 70–99)
Potassium: 3.8 mEq/L (ref 3.5–5.1)
Sodium: 140 mEq/L (ref 135–145)
Total Bilirubin: 0.7 mg/dL (ref 0.2–1.2)
Total Protein: 7.1 g/dL (ref 6.0–8.3)

## 2019-08-16 LAB — TSH: TSH: 0.7 u[IU]/mL (ref 0.35–4.50)

## 2019-08-16 LAB — LIPID PANEL
Cholesterol: 176 mg/dL (ref 0–200)
HDL: 62.5 mg/dL (ref >39.00)
LDL Cholesterol: 106 mg/dL — ABNORMAL HIGH (ref 0–99)
NonHDL: 113.15
Total CHOL/HDL Ratio: 3
Triglycerides: 34 mg/dL (ref 0.0–149.0)
VLDL: 6.8 mg/dL (ref 0.0–40.0)

## 2019-08-16 LAB — HEMOGLOBIN A1C: Hgb A1c MFr Bld: 5.4 % (ref 4.6–6.5)

## 2019-08-16 LAB — LUTEINIZING HORMONE: LH: 21.5 m[IU]/mL

## 2019-08-16 LAB — FOLLICLE STIMULATING HORMONE: FSH: 44.7 m[IU]/mL

## 2019-08-16 MED ORDER — NITROFURANTOIN MONOHYD MACRO 100 MG PO CAPS: ORAL_CAPSULE | ORAL | 4 refills | Status: DC

## 2019-08-16 MED ORDER — PENNSAID 2 % EX SOLN: 1.0000 "application " | Freq: Two times a day (BID) | CUTANEOUS | 3 refills | Status: DC | PRN

## 2019-08-16 MED ORDER — FLUOCINOLONE ACETONIDE 0.01 % OT OIL: TOPICAL_OIL | OTIC | 3 refills | Status: DC

## 2019-08-16 MED ORDER — MELOXICAM 15 MG PO TABS: 15.0000 mg | ORAL_TABLET | Freq: Every day | ORAL | 11 refills | Status: DC

## 2019-08-16 NOTE — Patient Instructions (Signed)
Great to see you. I will call you with your lab results from today and you can view them online.   We are referring you to a podiatrist as well.

## 2019-08-16 NOTE — Assessment & Plan Note (Signed)
Not worse with first few steps of the day- gets worse as the day goes on.  Has pes plaus.  Will refer to podiatry for custom orthotics. The patient indicates understanding of these issues and agrees with the plan.

## 2019-08-16 NOTE — Assessment & Plan Note (Signed)
With irregular periods, likely perimenopausal. Per pt request, will check FSH and LH today. The patient indicates understanding of these issues and agrees with the plan.

## 2019-08-16 NOTE — Assessment & Plan Note (Signed)
Reviewed preventive care protocols, scheduled due services, and updated immunizations Discussed nutrition, exercise, diet, and healthy lifestyle.  

## 2019-08-18 ENCOUNTER — Other Ambulatory Visit: Payer: Self-pay

## 2019-08-18 ENCOUNTER — Telehealth: Payer: Self-pay | Admitting: Family Medicine

## 2019-08-18 DIAGNOSIS — M5432 Sciatica, left side: Secondary | ICD-10-CM

## 2019-08-18 MED ORDER — AMBULATORY NON FORMULARY MEDICATION: 0 refills | Status: DC

## 2019-08-18 NOTE — Telephone Encounter (Signed)
Patient called office and is requesting new Rx for thigh high support stocking, patient old Rx expired. Patient is requesting same RX as before. Please call patient at [phone] when RX is ready to be picked up.

## 2019-08-18 NOTE — Telephone Encounter (Signed)
Yes okay to order.  Thanks

## 2019-08-18 NOTE — Telephone Encounter (Signed)
Rx sent in today. 

## 2019-08-18 NOTE — Telephone Encounter (Signed)
This is the RX for this:  Okay to fill?  Thigh-high, medium compression, graduated compression stockings.  Apply to lower extremities. [email or web address], Zippered Compression Stockings, medium circ, long length

## 2019-09-07 ENCOUNTER — Telehealth: Payer: Self-pay | Admitting: Family Medicine

## 2019-09-07 NOTE — Telephone Encounter (Signed)
Pt also wants to know if its going to be a 3 seriers of the tdap or just 1

## 2019-09-07 NOTE — Telephone Encounter (Signed)
Pt called and is wanting to get tdap done for travel and she wants to get copy of physcal that was done recently. Is it ok to do the tdap shot?

## 2019-09-08 ENCOUNTER — Telehealth: Payer: Self-pay | Admitting: Family Medicine

## 2019-09-08 ENCOUNTER — Ambulatory Visit: Payer: No Typology Code available for payment source | Admitting: Podiatry

## 2019-09-08 ENCOUNTER — Ambulatory Visit (INDEPENDENT_AMBULATORY_CARE_PROVIDER_SITE_OTHER): Payer: No Typology Code available for payment source

## 2019-09-08 ENCOUNTER — Encounter: Payer: Self-pay | Admitting: Podiatry

## 2019-09-08 ENCOUNTER — Other Ambulatory Visit: Payer: Self-pay

## 2019-09-08 ENCOUNTER — Other Ambulatory Visit: Payer: Self-pay | Admitting: Podiatry

## 2019-09-08 VITALS — BP 118/79 | HR 75 | Temp 96.7°F

## 2019-09-08 DIAGNOSIS — M79672 Pain in left foot: Secondary | ICD-10-CM

## 2019-09-08 DIAGNOSIS — M779 Enthesopathy, unspecified: Secondary | ICD-10-CM

## 2019-09-08 DIAGNOSIS — M722 Plantar fascial fibromatosis: Secondary | ICD-10-CM | POA: Diagnosis not present

## 2019-09-08 DIAGNOSIS — M79671 Pain in right foot: Secondary | ICD-10-CM | POA: Diagnosis not present

## 2019-09-08 NOTE — Telephone Encounter (Signed)
Pt called and wanted to know if she could pick up the prescription for sky high stockings, she said the one online the insurance said they were not going to pay for so she wanted to pick up a physical prescription so she can take to a medical supply store

## 2019-09-08 NOTE — Telephone Encounter (Signed)
Please call pt to get more information.  Okay to give Tdap.

## 2019-09-08 NOTE — Telephone Encounter (Signed)
Pt scheduled for a Tdap with understanding that she only has to have injection.  A copy of her physical is in basket and ready for pick up.

## 2019-09-09 ENCOUNTER — Ambulatory Visit: Payer: No Typology Code available for payment source

## 2019-09-09 NOTE — Progress Notes (Signed)
Subjective:   Patient ID: Jennifer Ware, female   DOB: 49 y.o.   MRN: [id number]   HPI Patient presents stating both her feet hurt her lower legs hurt and she works on [employer] as a Marine scientist.  Patient states that she is looking for some kind of increased support and also something to lift up her arches as she is getting ready to go to [place] for 3 [place].  Patient does not smoke and likes to be active   Review of Systems  All other systems reviewed and are negative.       Objective:  Physical Exam Vitals and nursing note reviewed.  Constitutional:      Appearance: She is well-developed.  Pulmonary:     Effort: Pulmonary effort is normal.  Musculoskeletal:        General: Normal range of motion.  Skin:    General: Skin is warm.  Neurological:     Mental Status: She is alert.     Neurovascular status found to be intact muscle strength was found to be adequate range of motion within normal limits.  Patient does have moderate flatfoot deformity bilateral with patient noted to have discomfort in the plantar arches and into the lower legs with equinus condition also noted.  Patient has good digital perfusion well oriented x3     Assessment:  Pain secondary most likely to foot structure with possibility of issues that are beyond just foot structure secondary to her overall body structure     Plan:  H&P reviewed condition and x-rays.  At this point I did scan for custom orthotics to try to provide for foot elevation take stress off her feet and I did go ahead and I dispensed fascial braces to try to lift up her arches.  Patient will be seen back when returned and is encouraged to call with questions  X-rays indicate there is depression of the arch bilateral with no other indications of pathology

## 2019-09-09 NOTE — Telephone Encounter (Signed)
Pt is aware she has already had this immunization/cancelled nurse visit/thx dmf

## 2019-09-09 NOTE — Telephone Encounter (Signed)
Selinda Eon, you'd have to do this for me as I'm not going to be in the office.  Can you please help me with this? Thanks!

## 2019-09-11 ENCOUNTER — Encounter: Payer: Self-pay | Admitting: Family Medicine

## 2019-09-17 ENCOUNTER — Ambulatory Visit: Payer: No Typology Code available for payment source | Admitting: Orthotics

## 2019-09-17 ENCOUNTER — Other Ambulatory Visit: Payer: Self-pay

## 2019-09-17 DIAGNOSIS — M722 Plantar fascial fibromatosis: Secondary | ICD-10-CM

## 2019-09-17 DIAGNOSIS — M79671 Pain in right foot: Secondary | ICD-10-CM

## 2019-09-17 DIAGNOSIS — M79672 Pain in left foot: Secondary | ICD-10-CM

## 2019-12-13 ENCOUNTER — Ambulatory Visit: Payer: No Typology Code available for payment source

## 2020-04-03 ENCOUNTER — Ambulatory Visit: Payer: No Typology Code available for payment source | Admitting: Family Medicine

## 2020-05-09 ENCOUNTER — Encounter: Payer: Self-pay | Admitting: Family Medicine

## 2020-05-09 ENCOUNTER — Ambulatory Visit (INDEPENDENT_AMBULATORY_CARE_PROVIDER_SITE_OTHER): Payer: No Typology Code available for payment source | Admitting: Family Medicine

## 2020-05-09 ENCOUNTER — Other Ambulatory Visit: Payer: Self-pay | Admitting: Family Medicine

## 2020-05-09 ENCOUNTER — Other Ambulatory Visit: Payer: Self-pay

## 2020-05-09 VITALS — BP 110/80 | HR 83 | Temp 97.4°F | Ht 62.0 in | Wt 156.8 lb

## 2020-05-09 DIAGNOSIS — Z1211 Encounter for screening for malignant neoplasm of colon: Secondary | ICD-10-CM

## 2020-05-09 DIAGNOSIS — N390 Urinary tract infection, site not specified: Secondary | ICD-10-CM | POA: Diagnosis not present

## 2020-05-09 DIAGNOSIS — M5432 Sciatica, left side: Secondary | ICD-10-CM

## 2020-05-09 DIAGNOSIS — L989 Disorder of the skin and subcutaneous tissue, unspecified: Secondary | ICD-10-CM | POA: Diagnosis not present

## 2020-05-09 MED ORDER — DICLOFENAC SODIUM 1.5 % EX SOLN: 1.0000 "application " | Freq: Two times a day (BID) | CUTANEOUS | 3 refills | Status: DC | PRN

## 2020-05-09 NOTE — Assessment & Plan Note (Addendum)
Responds well to nitrofurantoin prn. Cont use

## 2020-05-09 NOTE — Progress Notes (Signed)
Subjective:     Jennifer Ware is a 49 y.o. female presenting for Establish Care and Sciatica (L lumbar down to foot )     Back Pain This is a recurrent problem. The problem has been gradually worsening since onset. The pain is present in the lumbar spine. The pain radiates to the left foot. The pain is at a severity of 0/10. The symptoms are aggravated by standing and bending (walking). Associated symptoms include leg pain, numbness and tingling. Pertinent negatives include no bladder incontinence or bowel incontinence. She has tried home exercises (sitting, hx of Physical therapy) for the symptoms.     Post coital uti - rare use of nitrofurantoin - takes 1 pill - prevents uti  #Skin lesion - on back - present for years - will occasionally become painful and drain pus   Review of Systems  Gastrointestinal: Negative for bowel incontinence.  Genitourinary: Negative for bladder incontinence.  Musculoskeletal: Positive for back pain.  Neurological: Positive for tingling and numbness.     Social History   Tobacco Use  Smoking Status Never Smoker  Smokeless Tobacco Never Used        Objective:    BP Readings from Last 3 Encounters:  05/09/20 110/80  09/08/19 118/79  08/16/19 100/70   Wt Readings from Last 3 Encounters:  05/09/20 156 lb 12 oz (71.1 kg)  08/16/19 152 lb (68.9 kg)  08/11/18 144 lb 9.6 oz (65.6 kg)    BP 110/80   Pulse 83   Temp (!) 97.4 F (36.3 C) (Temporal)   Ht 5\' 2"  (1.575 m)   Wt 156 lb 12 oz (71.1 kg)   SpO2 99%   BMI 28.67 kg/m    Physical Exam Constitutional:      General: She is not in acute distress.    Appearance: She is well-developed. She is not diaphoretic.  HENT:     Right Ear: External ear normal.     Left Ear: External ear normal.     Nose: Nose normal.  Eyes:     Conjunctiva/sclera: Conjunctivae normal.  Cardiovascular:     Rate and Rhythm: Normal rate.  Pulmonary:     Effort: Pulmonary effort is normal.    Musculoskeletal:     Cervical back: Neck supple.     Comments: Back: Inspection: skin lesion as per skin exam Palpation: No spinous or paraspinous TTP ROM: normal - flexion with some pain the posterior calf Strength: normal LE strength Straight leg raise: shooting pain to the knee with left straight leg raise. Neg contralateral straight leg  Skin:    General: Skin is warm and dry.     Capillary Refill: Capillary refill takes less than 2 seconds.     Comments: Left lumbar area with 2 scarred lesions. Unable to palpate any cyst and non-fluctuant and no erythema.   Neurological:     Mental Status: She is alert. Mental status is at baseline.     Deep Tendon Reflexes: Reflexes normal.  Psychiatric:        Mood and Affect: Mood normal.        Behavior: Behavior normal.           Assessment & Plan:   Problem List Items Addressed This Visit      Nervous and Auditory   Sciatica of left side - Primary    Recurrent symptom for patient. Declined steroids and muscle relaxants. Advised cont meloxicam and trial of PT. Return if not improving  Relevant Medications   Diclofenac Sodium 1.5 % SOLN   Other Relevant Orders   Ambulatory referral to Physical Therapy     Musculoskeletal and Integument   Skin lesion of back    Hx seems consistent with sebacceous cyst vs recurrent boil? - though unable to palpate cyst on exam. She reports recent drainage. Advised follow-up when inflamed/infected and also offered dermatology consult for evaluation - she would like to see dermatology. Appreciate support.       Relevant Orders   Ambulatory referral to Dermatology     Genitourinary   Postcoital UTI    Responds well to nitrofurantoin prn. Cont use       Other Visit Diagnoses    Screening for colon cancer       Relevant Orders   Ambulatory referral to Gastroenterology       Return in about 4 months (around 09/08/2020) for annual exam.  Lesleigh Noe, MD  This visit occurred  during the SARS-CoV-2 public health emergency.  Safety protocols were in place, including screening questions prior to the visit, additional usage of staff PPE, and extensive cleaning of exam room while observing appropriate contact time as indicated for disinfecting solutions.

## 2020-05-09 NOTE — Assessment & Plan Note (Signed)
Recurrent symptom for patient. Declined steroids and muscle relaxants. Advised cont meloxicam and trial of PT. Return if not improving

## 2020-05-09 NOTE — Patient Instructions (Signed)
Back pain - try taking meloxicam daily - Call if you want to try muscle relaxant - physical therapy referral  Dermatology for skin lesion  GI for colonoscopy

## 2020-05-09 NOTE — Assessment & Plan Note (Signed)
Hx seems consistent with sebacceous cyst vs recurrent boil? - though unable to palpate cyst on exam. She reports recent drainage. Advised follow-up when inflamed/infected and also offered dermatology consult for evaluation - she would like to see dermatology. Appreciate support.

## 2020-05-11 ENCOUNTER — Other Ambulatory Visit: Payer: Self-pay | Admitting: Family Medicine

## 2020-05-11 DIAGNOSIS — Z1231 Encounter for screening mammogram for malignant neoplasm of breast: Secondary | ICD-10-CM

## 2020-06-12 ENCOUNTER — Ambulatory Visit: Payer: No Typology Code available for payment source | Attending: Family Medicine | Admitting: Physical Therapy

## 2020-06-12 ENCOUNTER — Encounter: Payer: Self-pay | Admitting: Physical Therapy

## 2020-06-12 ENCOUNTER — Other Ambulatory Visit: Payer: Self-pay

## 2020-06-12 ENCOUNTER — Ambulatory Visit
Admission: RE | Admit: 2020-06-12 | Discharge: 2020-06-12 | Disposition: A | Discharge status: Home / Self Care | Payer: No Typology Code available for payment source | Source: Ambulatory Visit | Attending: Family Medicine | Admitting: Family Medicine

## 2020-06-12 DIAGNOSIS — M5442 Lumbago with sciatica, left side: Secondary | ICD-10-CM | POA: Diagnosis present

## 2020-06-12 DIAGNOSIS — G8929 Other chronic pain: Secondary | ICD-10-CM | POA: Diagnosis present

## 2020-06-12 DIAGNOSIS — Z1231 Encounter for screening mammogram for malignant neoplasm of breast: Secondary | ICD-10-CM | POA: Insufficient documentation

## 2020-06-12 NOTE — Therapy (Signed)
[facility] [address] [place], [address] Phone: [phone]   Fax:  [phone]  Physical Therapy Evaluation  Patient Details  Name: Jennifer Ware MRN: [id number] Date of Birth: 1971/01/12 No data recorded  Encounter Date: 06/12/2020   PT End of Session - 06/12/20 0950    Visit Number 1    Number of Visits 17    Date for PT Re-Evaluation 08/11/20    PT Start Time 0950    PT Stop Time 1030    PT Time Calculation (min) 40 min    Activity Tolerance Patient tolerated treatment well;No increased pain    Behavior During Therapy WFL for tasks assessed/performed           Past Medical History:  Diagnosis Date  . Abdominal pain 10/29/2011  . Amenorrhea 10/22/2011  . Anxiety   . Chronic UTI 12/24/2011  . Dyspareunia 04/19/2014  . Ear discomfort 07/18/2011  . GERD (gastroesophageal reflux disease)   . HTN (hypertension) 04/25/2014  . Hx gestational diabetes   . Irregular periods/menstrual cycles 03/04/2014  . Knee crepitus 07/27/2014  . Low back pain 06/07/2011  . Osteoarthritis of both knees 08/07/2016  . Otitis externa 06/07/2011  . Palpitations   . Sciatica of left side 09/01/2017  . Sensation of feeling cold 07/27/2014  . SI (sacroiliac) joint dysfunction 09/01/2017  . Thyroid disease   . Viral URI with cough 09/07/2013    Past Surgical History:  Procedure Laterality Date  . BREAST BIOPSY Right 08/24/2014   negative, u/s core  . CESAREAN SECTION     x3  . HERNIA REPAIR     umbilical  . UMBILICAL HERNIA REPAIR      There were no vitals filed for this visit.    Subjective Assessment - 06/12/20 0953    Pertinent History Patient is a 49 year old female presenting with LBP "that goes into my coccyx" over the past 2 years, but that it has been getting worse over time insideous onset. Pain is made worse when she completes exercises (lifting weights and "workout videos") with pain with exercises that  make her "lift her legs", standing >2hours, walking >1 hour,  or bending forward. 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 At work she has increased pain with standing and walking, and has to take frequent breaks. Pt reports pain originates in LB and radiates into BLE making "thighs sore/aching" with LLE buring/electrical sensation to foot with standing/walking for a long time. Worst pain 10/10 best 0/10. Pt denies N/V, B&B changes, unexplained weight fluctuation, saddle paresthesia, fever, night sweats, or unrelenting night pain at this time.    Limitations Walking;Standing    How long can you sit comfortably? NA    How long can you stand comfortably? >2hours    How long can you walk comfortably? >1 hour    Diagnostic tests MRI in Feb 2019: L4-5 left central/subarticular disc protrusion with impingement to L5 nerve root;     Patient Stated Goals Complete work without severe pain    Currently in Pain? Yes    Pain Score 4     Pain Location Back    Pain Orientation Mid;Posterior;Lower    Pain Descriptors / Indicators Aching;Sore    Pain Type Chronic pain    Pain Radiating Towards post LLE electrical, bilat ant/lateral thigh soreness  Pain Onset More than a month ago    Pain Frequency Intermittent    Aggravating Factors  standing/walking for long time, bending forward    Pain Relieving Factors rest, meloxicam, heat    Effect of Pain on Daily Activities decreased efficiency at work            OBJECTIVE  Mental Status Patient is oriented to person, place and time.  Recent memory is intact.  Remote memory is intact.  Attention span and concentration are intact.  Expressive speech is intact.  Patient's fund of knowledge is within normal limits for educational level.  SENSATION: Grossly intact to light touch bilateral LEs as determined by testing dermatomes L2-S2 Proprioception and hot/cold  testing deferred on this date   MUSCULOSKELETAL: Tremor: None Bulk: Normal Tone: Normal No visible step-off along spinal column  Posture Lumbar lordosis: WNL Iliac crest height: equal bilaterally Lumbar lateral shift: negative Lower crossed syndrome (tight hip flexors and erector spinae; weak gluts and abs): negative  Gait Slight L  Trendelenburg, decreased hip ext bilat, decreased trunk mobility     Palpation  Tension at L QL, lumbar paraspinals, and superior glute max no pain;  Strength (out of 5) R/L 5/5 Hip flexion 5/5 Hip ER 5/5 Hip IR 4+/4- Hip abduction 5/5 Hip adduction 4-/3+ Hip extension 5/5 Knee extension 5/5 Knee flexion 5/5 Ankle dorsiflexion 5/5 Ankle plantarflexion 5 Trunk flexion 5 Trunk extension 5/5 Trunk rotation  *Indicates pain   AROM (degrees) R/L (all movements include overpressure unless otherwise stated) All hip and lumbar motions WNL with 35% deficit in lumbar ext   PROM (degrees) Full PROM  Repeated Movements Patient reports peripheralization with repeated ext; centralization with repeated standing. Reports that the exercises "google suggested" (ie alt supermans, prone press) exacerbated her pain greatly     Muscle Length Hamstrings: Negative bilat Ely: Negative bilat Ober: Negative bilat   Passive Accessory Intervertebral Motion (PAIVM) Reproduction of pain with L sided UPA at L4/5 level; normal mobility throughout  Passive Physiological Intervertebral Motion (PPIVM) Normal flexion and extension with PPIVM testing   SPECIAL TESTS Lumbar Radiculopathy and Discogenic: Centralization and Peripheralization (SN 92, -LR 0.12): positive centralization with lumbar flex Slump (SN 83, -LR 0.32): R: Negative (stretch) L: Positive SLR (SN 92, -LR 0.29): R: Positive Crossed SLR (SP 90): L Negative   Facet Joint: Extension-Rotation (SN 100, -LR 0.0):Negative bilat   Lumbar Spinal Stenosis: Lumbar quadrant (SN 70): Negative bilat    Hip: FABER (SN 81): Positive bilat FADIR (SN 94): Negative bilat  Hip scour (SN 50): Negative bilat  SIJ:  Thigh Thrust (SN 88, -LR 0.18) : Negative bilat   Piriformis Syndrome: FAIR Test (SN 88, SP 83): Negative bilat   Functional Tasks Squat: increased RLE WB and lateral shift   Ther-Ex PT reviewed the following HEP with patient with patient able to demonstrate a set of the following with min cuing for correction needed. PT educated patient on parameters of therex (how/when to inc/decrease intensity, frequency, rep/set range, stretch hold time, and purpose of therex) with verbalized understanding.  Access Code: [email or web address] Seated Figure 4 Piriformis Stretch - 3 x daily - 7 x weekly - 30sec hold Single Leg Bridge - 1 x daily - 7 x weekly - 3 sets - 10 reps Forward Bending Hip Flexion with Flat Lumbar Spine - 10 x daily - 7 x weekly - 10-12 reps      .     Objective measurements completed on examination: See above findings.  PT Education - 06/12/20 0949    Education Details Patient was educated on diagnosis, anatomy and pathology involved, prognosis, role of PT, and was given an HEP, demonstrating exercise with proper form following verbal and tactile cues, and was given a paper hand out to continue exercise at home. Pt was educated on and agreed to plan of care.    Person(s) Educated Patient    Methods Explanation;Demonstration;Verbal cues;Tactile cues;Handout    Comprehension Verbalized understanding;Returned demonstration;Verbal cues required;Tactile cues required            PT Short Term Goals - 06/12/20 1140      PT SHORT TERM GOAL #1   Title Pt will be independent with HEP in order to improve strength and decrease back pain in order to improve pain-free function at home and work.    Baseline 06/12/20 HEP given    Time 4    Period Weeks    Status New             PT Long Term Goals - 06/12/20 1140      PT LONG TERM GOAL #1   Title  Pt will decrease worst back pain as reported on NPRS by at least 2 points in order to demonstrate clinically significant reduction in back pain.    Baseline 06/12/20 10/10 LBP with L sided sciatica    Time 8    Period Weeks    Status New      PT LONG TERM GOAL #2   Title Pt will increase L hip abd and ext strength of to at least 4/5 MMT grade in order to demonstrate improvement in strength and function.    Baseline 06/12/20 L hip abd 4-; ext 3+    Time 8    Period Weeks    Status New      PT LONG TERM GOAL #3   Title Patient will increase FOTO score to 74 to demonstrate predicted increase in functional mobility to complete ADLs    Baseline 06/12/20 70    Time 8    Period Weeks    Status New                  Plan - 06/12/20 1105    Clinical Impression Statement Pt is a 49 year old female presenting with acute episode of chronic LBP with L sided sciatica. Impairments in decreased L hip strength, core strength, hip and lumbar mobility, decreased motor control, and pain. Activity limitations in standing/lifting from forward flexion, prolonged standing, and prolonged walking, inhibiting full participation in work as a [employer] scientist, and ind ADLs. Pt will benefit from skilled PT to address aforementioned impairments to return to optimal PLOF.    Personal Factors and Comorbidities Comorbidity 1;Comorbidity 2;Comorbidity 3+;Profession;Past/Current Experience;Time since onset of injury/illness/exacerbation;Sex    Comorbidities HTN, GERD, anxiety, chronic LBP    Examination-Activity Limitations Stand;Lift;Caring for Others;Squat;Locomotion Level    Examination-Participation Restrictions Cleaning;Occupation;Community Activity;Meal Prep    Stability/Clinical Decision Making Evolving/Moderate complexity    Clinical Decision Making Moderate    Rehab Potential Good    PT Frequency 2x / week    PT Duration 8 weeks    PT Treatment/Interventions ADLs/Self Care Home Management;Cryotherapy;Electrical  Stimulation;Moist Heat;Traction;Therapeutic exercise;Therapeutic activities;Functional mobility training;Patient/family education;Gait training;Ultrasound;Iontophoresis 4mg /ml Dexamethasone;DME Instruction;Balance training;Neuromuscular re-education;Manual techniques;Passive range of motion;Dry needling;Joint Manipulations    PT Next Visit Plan assess HEP/centralization, SLS, 10MWT, 5xSTS    PT Home Exercise Plan repeated flex, SL bridge,  piriformis stretch    Consulted and Agree  with Plan of Care Patient           Patient will benefit from skilled therapeutic intervention in order to improve the following deficits and impairments:  Decreased endurance, Hypomobility, Decreased activity tolerance, Decreased strength, Pain, Improper body mechanics, Decreased coordination, Increased fascial restricitons, Impaired flexibility, Postural dysfunction, Decreased range of motion, Abnormal gait, Decreased balance, Difficulty walking  Visit Diagnosis: Chronic midline low back pain with left-sided sciatica     Problem List Patient Active Problem List   Diagnosis Date Noted  . Postcoital UTI 05/09/2020  . Skin lesion of back 05/09/2020  . Bilateral foot pain 08/16/2019  . Hot flashes 08/16/2019  . SI (sacroiliac) joint dysfunction 09/01/2017  . Sciatica of left side 09/01/2017  . Osteoarthritis of both knees 08/07/2016  . Gastroesophageal reflux disease 08/07/2016  . Palpitations 07/27/2014   Durwin Reges DPT Durwin Reges 06/12/2020, 11:45 AM  [facility] [address] [place], [address] Phone: [phone]   Fax:  [phone]  Name: Juvia Aerts MRN: [id number] Date of Birth: 01/16/1971

## 2020-06-14 ENCOUNTER — Ambulatory Visit: Payer: No Typology Code available for payment source | Admitting: Physical Therapy

## 2020-06-16 ENCOUNTER — Ambulatory Visit: Payer: No Typology Code available for payment source | Admitting: Physical Therapy

## 2020-06-16 ENCOUNTER — Other Ambulatory Visit: Payer: Self-pay

## 2020-06-16 ENCOUNTER — Encounter: Payer: Self-pay | Admitting: Physical Therapy

## 2020-06-16 DIAGNOSIS — G8929 Other chronic pain: Secondary | ICD-10-CM

## 2020-06-16 DIAGNOSIS — M5442 Lumbago with sciatica, left side: Secondary | ICD-10-CM | POA: Diagnosis not present

## 2020-06-16 NOTE — Therapy (Signed)
[facility] [address] [place], [address] Phone: [phone]   Fax:  [phone]  Physical Therapy Treatment  Patient Details  Name: Jennifer Ware MRN: [id number] Date of Birth: 09-14-1970 No data recorded  Encounter Date: 06/16/2020   PT End of Session - 06/16/20 1027    Visit Number 2    Number of Visits 17    Date for PT Re-Evaluation 08/11/20    PT Start Time 1004    PT Stop Time 0973    PT Time Calculation (min) 41 min    Activity Tolerance Patient tolerated treatment well;No increased pain    Behavior During Therapy WFL for tasks assessed/performed           Past Medical History:  Diagnosis Date  . Abdominal pain 10/29/2011  . Amenorrhea 10/22/2011  . Anxiety   . Chronic UTI 12/24/2011  . Dyspareunia 04/19/2014  . Ear discomfort 07/18/2011  . GERD (gastroesophageal reflux disease)   . HTN (hypertension) 04/25/2014  . Hx gestational diabetes   . Irregular periods/menstrual cycles 03/04/2014  . Knee crepitus 07/27/2014  . Low back pain 06/07/2011  . Osteoarthritis of both knees 08/07/2016  . Otitis externa 06/07/2011  . Palpitations   . Sciatica of left side 09/01/2017  . Sensation of feeling cold 07/27/2014  . SI (sacroiliac) joint dysfunction 09/01/2017  . Thyroid disease   . Viral URI with cough 09/07/2013    Past Surgical History:  Procedure Laterality Date  . BREAST BIOPSY Right 08/24/2014   negative, u/s core  . CESAREAN SECTION     x3  . HERNIA REPAIR     umbilical  . UMBILICAL HERNIA REPAIR      There were no vitals filed for this visit.     Ther-Ex Nustep L2 seat 6 UE 8 79mins for gentle strengthening and lumbar rotation Seated Figure 4 Piriformis Stretch 30sec bilat Single Leg Bridge 2x 10 bilat with cuing for glute activation, and core stabilization without excessive lumbar ext with good carry over Core bracing in hooklying with hold x5 with good carry over of core  activation Qped alt hip ext with demo and max cuing needed initially for neutral spine with core activation with good carry over; alt birddog 2x 10 with min cuing to prevent lumbar ext/trunk rotation Squat x12 with good carry over of demo and cuing for core bracing and hip ext activation; pain at glutes near coccyx insertion bilat; wide childs pose stretch 2x 30sec which decreases this (added to HEP);  Following: squat with DB swing 15# 2x 10 with excellent carry over of technique Cable pull through 25# 2x 10 with max cuing and demo initially for technique with good carry over following Review of forward flex x10 every hour                          PT Education - 06/16/20 1025    Education Details HEP review, therex form/technique    Person(s) Educated Patient    Methods Explanation;Demonstration;Tactile cues;Verbal cues    Comprehension Verbalized understanding;Returned demonstration;Verbal cues required;Tactile cues required            PT Short Term Goals - 06/12/20 1140      PT SHORT TERM GOAL #1   Title Pt will be independent with HEP in order to improve strength and decrease back pain in order to improve pain-free function at home and work.  Baseline 06/12/20 HEP given    Time 4    Period Weeks    Status New             PT Long Term Goals - 06/12/20 1140      PT LONG TERM GOAL #1   Title Pt will decrease worst back pain as reported on NPRS by at least 2 points in order to demonstrate clinically significant reduction in back pain.    Baseline 06/12/20 10/10 LBP with L sided sciatica    Time 8    Period Weeks    Status New      PT LONG TERM GOAL #2   Title Pt will increase L hip abd and ext strength of to at least 4/5 MMT grade in order to demonstrate improvement in strength and function.    Baseline 06/12/20 L hip abd 4-; ext 3+    Time 8    Period Weeks    Status New      PT LONG TERM GOAL #3   Title Patient will increase FOTO score to 74 to  demonstrate predicted increase in functional mobility to complete ADLs    Baseline 06/12/20 70    Time 8    Period Weeks    Status New                 Plan - 06/16/20 1049    Clinical Impression Statement PT initiated therex for increased hip and core strength for lumbar stabilization with success. Pt is motivated throughout session with excellent carry over of all cuing and demonstrations for proper technique/form. Patient with some glute pain at coccyx insertion bilat with squat (following cuing for glute activation), that subsides with wide childs pose stretch. PT updated HEP to include this with education on tension > strength in glutes causing pain at insertion with good understanding; possible TDN next session. PT will continue with progression as able.    Personal Factors and Comorbidities Comorbidity 1;Comorbidity 2;Comorbidity 3+;Profession;Past/Current Experience;Time since onset of injury/illness/exacerbation;Sex    Comorbidities HTN, GERD, anxiety, chronic LBP    Examination-Activity Limitations Stand;Lift;Caring for Others;Squat;Locomotion Level    Examination-Participation Restrictions Cleaning;Occupation;Community Activity;Meal Prep    Stability/Clinical Decision Making Evolving/Moderate complexity    Clinical Decision Making Moderate    Rehab Potential Good    PT Frequency 2x / week    PT Duration 8 weeks    PT Treatment/Interventions ADLs/Self Care Home Management;Cryotherapy;Electrical Stimulation;Moist Heat;Traction;Therapeutic exercise;Therapeutic activities;Functional mobility training;Patient/family education;Gait training;Ultrasound;Iontophoresis 4mg /ml Dexamethasone;DME Instruction;Balance training;Neuromuscular re-education;Manual techniques;Passive range of motion;Dry needling;Joint Manipulations    PT Next Visit Plan assess HEP/centralization, SLS, 10MWT, 5xSTS    PT Home Exercise Plan repeated flex, SL bridge,  piriformis stretch    Consulted and Agree with  Plan of Care Patient           Patient will benefit from skilled therapeutic intervention in order to improve the following deficits and impairments:  Decreased endurance, Hypomobility, Decreased activity tolerance, Decreased strength, Pain, Improper body mechanics, Decreased coordination, Increased fascial restricitons, Impaired flexibility, Postural dysfunction, Decreased range of motion, Abnormal gait, Decreased balance, Difficulty walking  Visit Diagnosis: Chronic midline low back pain with left-sided sciatica     Problem List Patient Active Problem List   Diagnosis Date Noted  . Postcoital UTI 05/09/2020  . Skin lesion of back 05/09/2020  . Bilateral foot pain 08/16/2019  . Hot flashes 08/16/2019  . SI (sacroiliac) joint dysfunction 09/01/2017  . Sciatica of left side 09/01/2017  . Osteoarthritis of both  knees 08/07/2016  . Gastroesophageal reflux disease 08/07/2016  . Palpitations 07/27/2014   Durwin Reges DPT Durwin Reges 06/16/2020, 11:25 AM  [facility] [address], [place], [address] Phone: [phone]   Fax:  [phone]  Name: Yasuko Lapage MRN: [id number] Date of Birth: [birth year]

## 2020-06-19 ENCOUNTER — Other Ambulatory Visit: Payer: Self-pay

## 2020-06-19 ENCOUNTER — Encounter: Payer: Self-pay | Admitting: Physical Therapy

## 2020-06-19 ENCOUNTER — Encounter: Payer: No Typology Code available for payment source | Admitting: Physical Therapy

## 2020-06-19 ENCOUNTER — Ambulatory Visit: Payer: No Typology Code available for payment source | Admitting: Physical Therapy

## 2020-06-19 DIAGNOSIS — M5442 Lumbago with sciatica, left side: Secondary | ICD-10-CM | POA: Diagnosis not present

## 2020-06-19 DIAGNOSIS — G8929 Other chronic pain: Secondary | ICD-10-CM

## 2020-06-19 NOTE — Therapy (Signed)
[facility] [address], [place], [address] Phone: [phone]   Fax:  [phone]  Physical Therapy Treatment  Patient Details  Name: Jeffie Widdowson MRN: [id number] Date of Birth: 08/11/1971 No data recorded  Encounter Date: 06/19/2020   PT End of Session - 06/19/20 0823    Visit Number 3    Number of Visits 17    Date for PT Re-Evaluation 08/11/20    PT Start Time 0815    PT Stop Time 0900    PT Time Calculation (min) 45 min    Activity Tolerance Patient tolerated treatment well;No increased pain    Behavior During Therapy [facility] for tasks assessed/performed           Past Medical History:  Diagnosis Date   Abdominal pain 10/29/2011   Amenorrhea 10/22/2011   Anxiety    Chronic UTI 12/24/2011   Dyspareunia 04/19/2014   Ear discomfort 07/18/2011   GERD (gastroesophageal reflux disease)    HTN (hypertension) 04/25/2014   Hx gestational diabetes    Irregular periods/menstrual cycles 03/04/2014   Knee crepitus 07/27/2014   Low back pain 06/07/2011   Osteoarthritis of both knees 08/07/2016   Otitis externa 06/07/2011   Palpitations    Sciatica of left side 09/01/2017   Sensation of feeling cold 07/27/2014   SI (sacroiliac) joint dysfunction 09/01/2017   Thyroid disease    Viral URI with cough 09/07/2013    Past Surgical History:  Procedure Laterality Date   BREAST BIOPSY Right 08/24/2014   negative, u/s core   CESAREAN SECTION     x3   HERNIA REPAIR     umbilical   UMBILICAL HERNIA REPAIR      There were no vitals filed for this visit.   Subjective Assessment - 06/19/20 0817    Subjective Patient reports she had a rough night at work reporting that she did not have a tech on this shift. She has not had LLE tingling and pain but that she was having some sharp pain in her LB after walking for a while. Reports she was unable to complete HEP, no pain this am.    Pertinent  History Patient is a 49 year old female presenting with LBP "that goes into my coccyx" over the past 2 years, but that it has been getting worse over time insideous onset. Pain is made worse when she completes exercises (lifting weights and "workout videos") with pain with exercises that make her "lift her legs", standing >2hours, walking >1 hour,  or bending forward. 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 At work she has increased pain with standing and walking, and has to take frequent breaks. Pt reports pain originates in LB and radiates into BLE making "thighs sore/aching" with LLE buring/electrical sensation to foot with standing/walking for a long time. Worst pain 10/10 best 0/10. Pt denies N/V, B&B changes, unexplained weight fluctuation, saddle paresthesia, fever, night sweats, or unrelenting night pain at this time.    Limitations Walking;Standing    How long can you sit comfortably? NA    How long can you stand comfortably? >2hours    How long can you walk comfortably? >1 hour    Diagnostic tests MRI in Feb 2019: L4-5 left central/subarticular disc protrusion with impingement to L5 nerve root;     Patient Stated Goals Complete work without  severe pain    Pain Onset More than a month ago             Ther-Ex Nustep L2 seat 6 UE 8 50mins for gentle strengthening and lumbar rotation Lower trunk rotations x15 with cuing for lumbopelvic dissociation with good carry over Open book x12 bilat 3sec hold, increased "stiffness" sensation with R rotation  SKTC 30sec bilat DKTC wide position 60sec with rock Seated Figure 4 Piriformis Stretch 30sec bilat Squat x12 with excellent carry over of proper mechanics Squat with DB swing 20# 2x 8 with min cuing for glute contraction with good carry over Cable pull through 25# 3x 10/9/8 with min cuing for set up with good carry over following  Wide childs pose 2x  54min increasing hip ER/abd in second stretch      PT Education - 06/19/20 0823    Education Details therex form/technique    Person(s) Educated Patient    Methods Explanation;Demonstration;Verbal cues    Comprehension Verbalized understanding;Returned demonstration;Verbal cues required            PT Short Term Goals - 06/12/20 1140      PT SHORT TERM GOAL #1   Title Pt will be independent with HEP in order to improve strength and decrease back pain in order to improve pain-free function at home and work.    Baseline 06/12/20 HEP given    Time 4    Period Weeks    Status New             PT Long Term Goals - 06/12/20 1140      PT LONG TERM GOAL #1   Title Pt will decrease worst back pain as reported on NPRS by at least 2 points in order to demonstrate clinically significant reduction in back pain.    Baseline 06/12/20 10/10 LBP with L sided sciatica    Time 8    Period Weeks    Status New      PT LONG TERM GOAL #2   Title Pt will increase L hip abd and ext strength of to at least 4/5 MMT grade in order to demonstrate improvement in strength and function.    Baseline 06/12/20 L hip abd 4-; ext 3+    Time 8    Period Weeks    Status New      PT LONG TERM GOAL #3   Title Patient will increase FOTO score to 74 to demonstrate predicted increase in functional mobility to complete ADLs    Baseline 06/12/20 70    Time 8    Period Weeks    Status New                 Plan - 06/19/20 2423    Clinical Impression Statement PT continued therex for increased hip and core strength and increased lumbopelvic mobility with success. Patient is able to complete all therex with proper technique following demo and cuing without pain and with good motivation throughout session. PT will continue progression as able.    Personal Factors and Comorbidities Comorbidity 1;Comorbidity 2;Comorbidity 3+;Profession;Past/Current Experience;Time since onset of injury/illness/exacerbation;Sex     Comorbidities HTN, GERD, anxiety, chronic LBP    Examination-Activity Limitations Stand;Lift;Caring for Others;Squat;Locomotion Level    Examination-Participation Restrictions Cleaning;Occupation;Community Activity;Meal Prep    Stability/Clinical Decision Making Evolving/Moderate complexity    Clinical Decision Making Moderate    Rehab Potential Good    PT Frequency 2x / week    PT Duration 8 weeks  PT Treatment/Interventions ADLs/Self Care Home Management;Cryotherapy;Electrical Stimulation;Moist Heat;Traction;Therapeutic exercise;Therapeutic activities;Functional mobility training;Patient/family education;Gait training;Ultrasound;Iontophoresis 4mg /ml Dexamethasone;DME Instruction;Balance training;Neuromuscular re-education;Manual techniques;Passive range of motion;Dry needling;Joint Manipulations    PT Next Visit Plan assess HEP/centralization, SLS, 10MWT, 5xSTS    PT Home Exercise Plan repeated flex, SL bridge,  piriformis stretch    Consulted and Agree with Plan of Care Patient           Patient will benefit from skilled therapeutic intervention in order to improve the following deficits and impairments:  Decreased endurance, Hypomobility, Decreased activity tolerance, Decreased strength, Pain, Improper body mechanics, Decreased coordination, Increased fascial restricitons, Impaired flexibility, Postural dysfunction, Decreased range of motion, Abnormal gait, Decreased balance, Difficulty walking  Visit Diagnosis: Chronic midline low back pain with left-sided sciatica     Problem List Patient Active Problem List   Diagnosis Date Noted   Postcoital UTI 05/09/2020   Skin lesion of back 05/09/2020   Bilateral foot pain 08/16/2019   Hot flashes 08/16/2019   SI (sacroiliac) joint dysfunction 09/01/2017   Sciatica of left side 09/01/2017   Osteoarthritis of both knees 08/07/2016   Gastroesophageal reflux disease 08/07/2016   Palpitations 07/27/2014   Durwin Reges  DPT  Durwin Reges 06/19/2020, 9:47 AM  [facility] [address], [place], [address] Phone: [phone]   Fax:  [phone]  Name: Breland Elders MRN: [id number] Date of Birth: 1971-05-09

## 2020-06-23 ENCOUNTER — Ambulatory Visit: Payer: No Typology Code available for payment source | Admitting: Physical Therapy

## 2020-06-23 ENCOUNTER — Encounter: Payer: Self-pay | Admitting: Physical Therapy

## 2020-06-23 ENCOUNTER — Other Ambulatory Visit: Payer: Self-pay

## 2020-06-23 DIAGNOSIS — M5442 Lumbago with sciatica, left side: Secondary | ICD-10-CM

## 2020-06-23 NOTE — Therapy (Signed)
[facility] [address] [place], [address] Phone: [phone]   Fax:  [phone]  Physical Therapy Treatment  Patient Details  Name: Jennifer Ware MRN: [id number] Date of Birth: 18-Feb-1971 No data recorded  Encounter Date: 06/23/2020   PT End of Session - 06/23/20 0824    Visit Number 4    Number of Visits 17    Date for PT Re-Evaluation 08/11/20    PT Start Time 0819    PT Stop Time 0900    PT Time Calculation (min) 41 min    Activity Tolerance Patient tolerated treatment well;No increased pain    Behavior During Therapy WFL for tasks assessed/performed           Past Medical History:  Diagnosis Date  . Abdominal pain 10/29/2011  . Amenorrhea 10/22/2011  . Anxiety   . Chronic UTI 12/24/2011  . Dyspareunia 04/19/2014  . Ear discomfort 07/18/2011  . GERD (gastroesophageal reflux disease)   . HTN (hypertension) 04/25/2014  . Hx gestational diabetes   . Irregular periods/menstrual cycles 03/04/2014  . Knee crepitus 07/27/2014  . Low back pain 06/07/2011  . Osteoarthritis of both knees 08/07/2016  . Otitis externa 06/07/2011  . Palpitations   . Sciatica of left side 09/01/2017  . Sensation of feeling cold 07/27/2014  . SI (sacroiliac) joint dysfunction 09/01/2017  . Thyroid disease   . Viral URI with cough 09/07/2013    Past Surgical History:  Procedure Laterality Date  . BREAST BIOPSY Right 08/24/2014   negative, u/s core  . CESAREAN SECTION     x3  . HERNIA REPAIR     umbilical  . UMBILICAL HERNIA REPAIR      There were no vitals filed for this visit.   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 Reports compliance with HEP.  Reports when she has pain it usually is centralized to low back without leg pain, with leg pain being infrequently.     Pertinent History Patient is a 49 year old female presenting with LBP "that goes into my coccyx" over the past 2 years, but that it has been getting worse over time insideous onset. Pain is made worse when she completes exercises (lifting weights and "workout videos") with pain with exercises that make her "lift her legs", standing >2hours, walking >1 hour,  or bending forward. 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 At work she has increased pain with standing and walking, and has to take frequent breaks. Pt reports pain originates in LB and radiates into BLE making "thighs sore/aching" with LLE buring/electrical sensation to foot with standing/walking for a long time. Worst pain 10/10 best 0/10. Pt denies N/V, B&B changes, unexplained weight fluctuation, saddle paresthesia, fever, night sweats, or unrelenting night pain at this time.    Limitations Walking;Standing    How long can you sit comfortably? NA    How long can you stand comfortably? >2hours    How long can you walk comfortably? >1 hour    Diagnostic tests MRI in Feb 2019: L4-5 left central/subarticular disc protrusion with impingement to L5 nerve root;     Patient Stated Goals Complete work without severe pain  Pain Onset More than a month ago             Ther-Ex Nustep L2 seat 6 UE 8 59mins for gentle strengthening and lumbar rotation Lower trunk rotations x15 with cuing for lumbopelvic dissociation with good carry over SL bridge 2x 10 bilat with max cuing needed for small lift of CLLE, as ext of RLE causes concordant pain of RLE, education on nerve tension Squat with DB swing 20# 3x 6 with excellent carry over of proper technique Hip hinge with good carry over of cuing for technique x12; SL hip hinge x8; with 5# DB in opposite UE to prevent hip rotation with good carry over Lumber ext in sitting over towel roll x12 3sec hold- not pain  provoking this session  Wide childs pose 2x 34min increasing hip ER/abd in second stretch            PT Education - 06/23/20 0822    Education Details therex form/technique    Person(s) Educated Patient    Methods Explanation;Demonstration;Verbal cues    Comprehension Verbalized understanding;Returned demonstration;Verbal cues required            PT Short Term Goals - 06/12/20 1140      PT SHORT TERM GOAL #1   Title Pt will be independent with HEP in order to improve strength and decrease back pain in order to improve pain-free function at home and work.    Baseline 06/12/20 HEP given    Time 4    Period Weeks    Status New             PT Long Term Goals - 06/12/20 1140      PT LONG TERM GOAL #1   Title Pt will decrease worst back pain as reported on NPRS by at least 2 points in order to demonstrate clinically significant reduction in back pain.    Baseline 06/12/20 10/10 LBP with L sided sciatica    Time 8    Period Weeks    Status New      PT LONG TERM GOAL #2   Title Pt will increase L hip abd and ext strength of to at least 4/5 MMT grade in order to demonstrate improvement in strength and function.    Baseline 06/12/20 L hip abd 4-; ext 3+    Time 8    Period Weeks    Status New      PT LONG TERM GOAL #3   Title Patient will increase FOTO score to 74 to demonstrate predicted increase in functional mobility to complete ADLs    Baseline 06/12/20 70    Time [place] - 06/23/20 0844    Clinical Impression Statement PT continued therex for increased hip and core stability with re-introduction of lumbar ext with good success. Patient with muscle fatigue throughout session, with no increased pain. Pt is able to comply with all cuing for proper technique of therex with good motivation throughout session and no increased pain. PT will continue progressiona s able.    Personal Factors and Comorbidities Comorbidity  1;Comorbidity 2;Comorbidity 3+;Profession;Past/Current Experience;Time since onset of injury/illness/exacerbation;Sex    Comorbidities HTN, GERD, anxiety, chronic LBP    Examination-Activity Limitations Stand;Lift;Caring for Others;Squat;Locomotion Level    Examination-Participation Restrictions Cleaning;Occupation;Community Activity;Meal Prep    Stability/Clinical Decision Making Evolving/Moderate complexity    Clinical  Decision Making Moderate    Rehab Potential Good    PT Frequency 2x / week    PT Duration 8 weeks    PT Treatment/Interventions ADLs/Self Care Home Management;Cryotherapy;Electrical Stimulation;Moist Heat;Traction;Therapeutic exercise;Therapeutic activities;Functional mobility training;Patient/family education;Gait training;Ultrasound;Iontophoresis 4mg /ml Dexamethasone;DME Instruction;Balance training;Neuromuscular re-education;Manual techniques;Passive range of motion;Dry needling;Joint Manipulations    PT Next Visit Plan continue POC    PT Home Exercise Plan repeated flex, SL bridge,  piriformis stretch    Consulted and Agree with Plan of Care Patient           Patient will benefit from skilled therapeutic intervention in order to improve the following deficits and impairments:  Decreased endurance, Hypomobility, Decreased activity tolerance, Decreased strength, Pain, Improper body mechanics, Decreased coordination, Increased fascial restricitons, Impaired flexibility, Postural dysfunction, Decreased range of motion, Abnormal gait, Decreased balance, Difficulty walking  Visit Diagnosis: Chronic midline low back pain with left-sided sciatica     Problem List Patient Active Problem List   Diagnosis Date Noted  . Postcoital UTI 05/09/2020  . Skin lesion of back 05/09/2020  . Bilateral foot pain 08/16/2019  . Hot flashes 08/16/2019  . SI (sacroiliac) joint dysfunction 09/01/2017  . Sciatica of left side 09/01/2017  . Osteoarthritis of both knees 08/07/2016  .  Gastroesophageal reflux disease 08/07/2016  . Palpitations 07/27/2014   Durwin Reges DPT  Durwin Reges 06/23/2020, 8:57 AM  [facility] [address], [place], [address] Phone: [phone]   Fax:  [phone]  Name: Favour Aleshire MRN: [id number] Date of Birth: 12-31-1970

## 2020-06-26 ENCOUNTER — Ambulatory Visit: Payer: No Typology Code available for payment source | Admitting: Physical Therapy

## 2020-06-26 NOTE — Progress Notes (Signed)
Patient picked up f/o and was pleased with fit, comfort, and function.  Worked well with footwear.  Told of rbeak in period and how to report any issues.  

## 2020-06-28 ENCOUNTER — Ambulatory Visit: Payer: No Typology Code available for payment source | Admitting: Physical Therapy

## 2020-06-30 ENCOUNTER — Telehealth: Payer: Self-pay | Admitting: Family Medicine

## 2020-06-30 NOTE — Telephone Encounter (Signed)
Error

## 2020-07-03 ENCOUNTER — Other Ambulatory Visit: Payer: Self-pay

## 2020-07-03 ENCOUNTER — Ambulatory Visit: Payer: No Typology Code available for payment source | Admitting: Physical Therapy

## 2020-07-03 ENCOUNTER — Encounter: Payer: Self-pay | Admitting: Physical Therapy

## 2020-07-03 DIAGNOSIS — M5442 Lumbago with sciatica, left side: Secondary | ICD-10-CM | POA: Diagnosis not present

## 2020-07-03 DIAGNOSIS — G8929 Other chronic pain: Secondary | ICD-10-CM

## 2020-07-03 NOTE — Therapy (Signed)
[facility] [address], [place], [address] Phone: [phone]   Fax:  [phone]  Physical Therapy Treatment  Patient Details  Name: Jennifer Ware MRN: [id number] Date of Birth: Apr 30, 1971 No data recorded  Encounter Date: 07/03/2020   PT End of Session - 07/03/20 0751    Visit Number 5    Number of Visits 17    Date for PT Re-Evaluation 08/11/20    PT Start Time 0733    PT Stop Time 0811    PT Time Calculation (min) 38 min    Activity Tolerance Patient tolerated treatment well;No increased pain    Behavior During Therapy WFL for tasks assessed/performed           Past Medical History:  Diagnosis Date  . Abdominal pain 10/29/2011  . Amenorrhea 10/22/2011  . Anxiety   . Chronic UTI 12/24/2011  . Dyspareunia 04/19/2014  . Ear discomfort 07/18/2011  . GERD (gastroesophageal reflux disease)   . HTN (hypertension) 04/25/2014  . Hx gestational diabetes   . Irregular periods/menstrual cycles 03/04/2014  . Knee crepitus 07/27/2014  . Low back pain 06/07/2011  . Osteoarthritis of both knees 08/07/2016  . Otitis externa 06/07/2011  . Palpitations   . Sciatica of left side 09/01/2017  . Sensation of feeling cold 07/27/2014  . SI (sacroiliac) joint dysfunction 09/01/2017  . Thyroid disease   . Viral URI with cough 09/07/2013    Past Surgical History:  Procedure Laterality Date  . BREAST BIOPSY Right 08/24/2014   negative, u/s core  . CESAREAN SECTION     x3  . HERNIA REPAIR     umbilical  . UMBILICAL HERNIA REPAIR      There were no vitals filed for this visit.   Subjective Assessment - 07/03/20 0738    Subjective Patient reports she is not currently in pain, reports that she forgets about it at times. Reports the tingling is reduced, but does have anterolateral thigh pain. Her pain is being brought on by her piriformis stretch, and work duties (standing and sitting for long periods) but had no pain  at work last night.    Pertinent History Patient is a 49 year old female presenting with LBP "that goes into my coccyx" over the past 2 years, but that it has been getting worse over time insideous onset. Pain is made worse when she completes exercises (lifting weights and "workout videos") with pain with exercises that make her "lift her legs", standing >2hours, walking >1 hour,  or bending forward. 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 At work she has increased pain with standing and walking, and has to take frequent breaks. Pt reports pain originates in LB and radiates into BLE making "thighs sore/aching" with LLE buring/electrical sensation to foot with standing/walking for a long time. Worst pain 10/10 best 0/10. Pt denies N/V, B&B changes, unexplained weight fluctuation, saddle paresthesia, fever, night sweats, or unrelenting night pain at this time.    How long can you sit comfortably? NA    How long can you stand comfortably? >2hours    How long can you walk comfortably? >1 hour    Diagnostic tests MRI in Feb 2019: L4-5 left central/subarticular disc protrusion with impingement to L5 nerve root;     Patient Stated Goals Complete work without severe pain    Pain  Onset More than a month ago             Ther-Ex Nustep L2 seat 6 UE 8 33mins for gentle strengthening and lumbar rotation Wide childs pose Lower trunk rotations x15 with cuing for lumbopelvic dissociation with good carry over SL bridge 2x 10 bilat with max cuing needed for small lift of CLLE, as ext of RLE causes concordant pain of RLE, education on nerve tension SL squat to elevated mat table 2x 8 bilat with cuing and demo initially for technique with good carry over following Side stepping with GTB anti-rotation/palloff in mini squat 3x 8 steps L<>R facing both directions PT anchoring band; max cuing and demo needed for technique                           PT Education - 07/03/20 0750    Education Details therex form/technique    Person(s) Educated Patient    Methods Explanation;Demonstration;Verbal cues    Comprehension Verbalized understanding;Returned demonstration;Verbal cues required            PT Short Term Goals - 06/12/20 1140      PT SHORT TERM GOAL #1   Title Pt will be independent with HEP in order to improve strength and decrease back pain in order to improve pain-free function at home and work.    Baseline 06/12/20 HEP given    Time 4    Period Weeks    Status New             PT Long Term Goals - 06/12/20 1140      PT LONG TERM GOAL #1   Title Pt will decrease worst back pain as reported on NPRS by at least 2 points in order to demonstrate clinically significant reduction in back pain.    Baseline 06/12/20 10/10 LBP with L sided sciatica    Time 8    Period Weeks    Status New      PT LONG TERM GOAL #2   Title Pt will increase L hip abd and ext strength of to at least 4/5 MMT grade in order to demonstrate improvement in strength and function.    Baseline 06/12/20 L hip abd 4-; ext 3+    Time 8    Period Weeks    Status New      PT LONG TERM GOAL #3   Title Patient will increase FOTO score to 74 to demonstrate predicted increase in functional mobility to complete ADLs    Baseline 06/12/20 70    Time [place] - 07/03/20 9357    Clinical Impression Statement Pt continues to report centralization progress with solely complaints of LBP with prolonged standing with work, but some days she has no pain which is an improvement. She is able to complete all therex with proper techniques following multimodal cuing and demonstrations. PT will continue progression as able.    Personal Factors and Comorbidities Comorbidity 1;Comorbidity 2;Comorbidity 3+;Profession;Past/Current Experience;Time since onset of  injury/illness/exacerbation;Sex    Comorbidities HTN, GERD, anxiety, chronic LBP    Examination-Activity Limitations Stand;Lift;Caring for Others;Squat;Locomotion Level    Examination-Participation Restrictions Cleaning;Occupation;Community Activity;Meal Prep    Stability/Clinical Decision Making Evolving/Moderate complexity    Clinical Decision Making Moderate    Rehab Potential Good    PT Frequency 2x /  week    PT Duration 8 weeks    PT Treatment/Interventions ADLs/Self Care Home Management;Cryotherapy;Electrical Stimulation;Moist Heat;Traction;Therapeutic exercise;Therapeutic activities;Functional mobility training;Patient/family education;Gait training;Ultrasound;Iontophoresis 4mg /ml Dexamethasone;DME Instruction;Balance training;Neuromuscular re-education;Manual techniques;Passive range of motion;Dry needling;Joint Manipulations    PT Next Visit Plan continue POC    PT Home Exercise Plan repeated flex, SL bridge,  piriformis stretch    Consulted and Agree with Plan of Care Patient           Patient will benefit from skilled therapeutic intervention in order to improve the following deficits and impairments:  Decreased endurance, Hypomobility, Decreased activity tolerance, Decreased strength, Pain, Improper body mechanics, Decreased coordination, Increased fascial restricitons, Impaired flexibility, Postural dysfunction, Decreased range of motion, Abnormal gait, Decreased balance, Difficulty walking  Visit Diagnosis: Chronic midline low back pain with left-sided sciatica     Problem List Patient Active Problem List   Diagnosis Date Noted  . Postcoital UTI 05/09/2020  . Skin lesion of back 05/09/2020  . Bilateral foot pain 08/16/2019  . Hot flashes 08/16/2019  . SI (sacroiliac) joint dysfunction 09/01/2017  . Sciatica of left side 09/01/2017  . Osteoarthritis of both knees 08/07/2016  . Gastroesophageal reflux disease 08/07/2016  . Palpitations 07/27/2014   Durwin Reges  DPT Durwin Reges 07/03/2020, 9:25 AM  [facility] [address], [place], [address] Phone: [phone]   Fax:  [phone]  Name: Jennifer Ware MRN: [id number] Date of Birth: 22-Dec-1970

## 2020-07-12 ENCOUNTER — Ambulatory Visit: Payer: No Typology Code available for payment source | Attending: Family Medicine | Admitting: Physical Therapy

## 2020-07-12 ENCOUNTER — Encounter: Payer: Self-pay | Admitting: Physical Therapy

## 2020-07-12 ENCOUNTER — Other Ambulatory Visit: Payer: Self-pay

## 2020-07-12 DIAGNOSIS — G8929 Other chronic pain: Secondary | ICD-10-CM | POA: Diagnosis present

## 2020-07-12 DIAGNOSIS — M5442 Lumbago with sciatica, left side: Secondary | ICD-10-CM | POA: Insufficient documentation

## 2020-07-12 NOTE — Therapy (Signed)
[facility] [address], [place], [address] Phone: [phone]   Fax:  [phone]  Physical Therapy Treatment  Patient Details  Name: Jennifer Ware MRN: [id number] Date of Birth: [birth year] No data recorded  Encounter Date: 07/12/2020   PT End of Session - 07/12/20 1035    Visit Number 6    Number of Visits 17    Date for PT Re-Evaluation 08/11/20    PT Start Time 1030    PT Stop Time 1110    PT Time Calculation (min) 40 min    Activity Tolerance Patient tolerated treatment well;No increased pain    Behavior During Therapy WFL for tasks assessed/performed           Past Medical History:  Diagnosis Date  . Abdominal pain 10/29/2011  . Amenorrhea 10/22/2011  . Anxiety   . Chronic UTI 12/24/2011  . Dyspareunia 04/19/2014  . Ear discomfort 07/18/2011  . GERD (gastroesophageal reflux disease)   . HTN (hypertension) 04/25/2014  . Hx gestational diabetes   . Irregular periods/menstrual cycles 03/04/2014  . Knee crepitus 07/27/2014  . Low back pain 06/07/2011  . Osteoarthritis of both knees 08/07/2016  . Otitis externa 06/07/2011  . Palpitations   . Sciatica of left side 09/01/2017  . Sensation of feeling cold 07/27/2014  . SI (sacroiliac) joint dysfunction 09/01/2017  . Thyroid disease   . Viral URI with cough 09/07/2013    Past Surgical History:  Procedure Laterality Date  . BREAST BIOPSY Right 08/24/2014   negative, u/s core  . CESAREAN SECTION     x3  . HERNIA REPAIR     umbilical  . UMBILICAL HERNIA REPAIR      There were no vitals filed for this visit.   Subjective Assessment - 07/12/20 1032    Subjective Patient reports her LBP is better than when she started PT. Is having some pain radiating down LLE to the calf but is not as intense as it was. Is minimally compliant with HEP.    Pertinent History Patient is a 49 year old female presenting with LBP "that goes into my coccyx" over the past  2 years, but that it has been getting worse over time insideous onset. Pain is made worse when she completes exercises (lifting weights and "workout videos") with pain with exercises that make her "lift her legs", standing >2hours, walking >1 hour,  or bending forward. 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 At work she has increased pain with standing and walking, and has to take frequent breaks. Pt reports pain originates in LB and radiates into BLE making "thighs sore/aching" with LLE buring/electrical sensation to foot with standing/walking for a long time. Worst pain 10/10 best 0/10. Pt denies N/V, B&B changes, unexplained weight fluctuation, saddle paresthesia, fever, night sweats, or unrelenting night pain at this time.    Limitations Walking;Standing    How long can you sit comfortably? NA    How long can you stand comfortably? >2hours    How long can you walk comfortably? >1 hour    Diagnostic tests MRI in Feb 2019: L4-5 left central/subarticular disc protrusion with impingement to L5 nerve root;     Patient Stated Goals Complete work without severe pain    Pain Onset More than a month ago  Ther-Ex Nustep L3 seat 6 UE 8 33mins for gentle strengthening and lumbar rotation Lower trunk rotations x15 with cuing for lumbopelvic dissociation with good carry over [place] with alt marching 3x 10 with cuing to maintain hip elevation; following last set reports some increased sciatic pain, relieved with supine sciatic nerve glide 30sec Straight leg deadlift 30# KB 3x 8 with good carry over of technique following cuing for core activation and scapular retraction  Palloff antirotation 5# 2 x10 each direction with good carry over of technique, and eccentric control          PT Education - 07/12/20 1034    Education Details therex form/technique    Person(s) Educated  Patient    Methods Explanation;Demonstration;Tactile cues;Verbal cues    Comprehension Verbalized understanding;Returned demonstration;Verbal cues required;Tactile cues required            PT Short Term Goals - 06/12/20 1140      PT SHORT TERM GOAL #1   Title Pt will be independent with HEP in order to improve strength and decrease back pain in order to improve pain-free function at home and work.    Baseline 06/12/20 HEP given    Time 4    Period Weeks    Status New             PT Long Term Goals - 06/12/20 1140      PT LONG TERM GOAL #1   Title Pt will decrease worst back pain as reported on NPRS by at least 2 points in order to demonstrate clinically significant reduction in back pain.    Baseline 06/12/20 10/10 LBP with L sided sciatica    Time 8    Period Weeks    Status New      PT LONG TERM GOAL #2   Title Pt will increase L hip abd and ext strength of to at least 4/5 MMT grade in order to demonstrate improvement in strength and function.    Baseline 06/12/20 L hip abd 4-; ext 3+    Time 8    Period Weeks    Status New      PT LONG TERM GOAL #3   Title Patient will increase FOTO score to 74 to demonstrate predicted increase in functional mobility to complete ADLs    Baseline 06/12/20 70    Time 8    Period Weeks    Status New                 Plan - 07/12/20 1107    Clinical Impression Statement PT continued therex progression for increased core and hip strengthening/stabilization with success. Pt with some increased peripheralization with lumbar ext compensation in bridge exercise, that subsides with sciatic nerve glide. PT continued encouragement of flexion movements and nerve glides as needed for pain management, with good carry over.    Personal Factors and Comorbidities Comorbidity 1;Comorbidity 2;Comorbidity 3+;Profession;Past/Current Experience;Time since onset of injury/illness/exacerbation;Sex    Comorbidities HTN, GERD, anxiety, chronic LBP     Examination-Activity Limitations Stand;Lift;Caring for Others;Squat;Locomotion Level    Examination-Participation Restrictions Cleaning;Occupation;Community Activity;Meal Prep    Stability/Clinical Decision Making Evolving/Moderate complexity    Clinical Decision Making Moderate    Rehab Potential Good    PT Frequency 2x / week    PT Duration 8 weeks    PT Treatment/Interventions ADLs/Self Care Home Management;Cryotherapy;Electrical Stimulation;Moist Heat;Traction;Therapeutic exercise;Therapeutic activities;Functional mobility training;Patient/family education;Gait training;Ultrasound;Iontophoresis 4mg /ml Dexamethasone;DME Instruction;Balance training;Neuromuscular re-education;Manual techniques;Passive range of motion;Dry needling;Joint Manipulations  PT Next Visit Plan continue POC    PT Home Exercise Plan repeated flex, SL bridge,  piriformis stretch    Consulted and Agree with Plan of Care Patient           Patient will benefit from skilled therapeutic intervention in order to improve the following deficits and impairments:  Decreased endurance, Hypomobility, Decreased activity tolerance, Decreased strength, Pain, Improper body mechanics, Decreased coordination, Increased fascial restricitons, Impaired flexibility, Postural dysfunction, Decreased range of motion, Abnormal gait, Decreased balance, Difficulty walking  Visit Diagnosis: Chronic midline low back pain with left-sided sciatica     Problem List Patient Active Problem List   Diagnosis Date Noted  . Postcoital UTI 05/09/2020  . Skin lesion of back 05/09/2020  . Bilateral foot pain 08/16/2019  . Hot flashes 08/16/2019  . SI (sacroiliac) joint dysfunction 09/01/2017  . Sciatica of left side 09/01/2017  . Osteoarthritis of both knees 08/07/2016  . Gastroesophageal reflux disease 08/07/2016  . Palpitations 07/27/2014   Durwin Reges DPT Durwin Reges 07/12/2020, 11:50 AM  [facility] [address] [place], [address] Phone: [phone]   Fax:  [phone]  Name: Jennifer Ware MRN: [id number] Date of Birth: Dec 25, 1970

## 2020-07-18 ENCOUNTER — Encounter: Payer: Self-pay | Admitting: Physical Therapy

## 2020-07-18 ENCOUNTER — Ambulatory Visit: Payer: No Typology Code available for payment source | Admitting: Physical Therapy

## 2020-07-18 ENCOUNTER — Other Ambulatory Visit: Payer: Self-pay

## 2020-07-18 DIAGNOSIS — G8929 Other chronic pain: Secondary | ICD-10-CM

## 2020-07-18 DIAGNOSIS — M5442 Lumbago with sciatica, left side: Secondary | ICD-10-CM

## 2020-07-18 NOTE — Therapy (Signed)
[facility] [address] [place], [address] Phone: [phone]   Fax:  [phone]  Physical Therapy Treatment  Patient Details  Name: Jennifer Ware MRN: [id number] Date of Birth: [birth year] No data recorded  Encounter Date: 07/18/2020   PT End of Session - 07/18/20 0932    Visit Number 7    Number of Visits 17    Date for PT Re-Evaluation 08/11/20    PT Start Time 0900    PT Stop Time 0938    PT Time Calculation (min) 38 min    Activity Tolerance Patient tolerated treatment well;No increased pain    Behavior During Therapy WFL for tasks assessed/performed           Past Medical History:  Diagnosis Date  . Abdominal pain 10/29/2011  . Amenorrhea 10/22/2011  . Anxiety   . Chronic UTI 12/24/2011  . Dyspareunia 04/19/2014  . Ear discomfort 07/18/2011  . GERD (gastroesophageal reflux disease)   . HTN (hypertension) 04/25/2014  . Hx gestational diabetes   . Irregular periods/menstrual cycles 03/04/2014  . Knee crepitus 07/27/2014  . Low back pain 06/07/2011  . Osteoarthritis of both knees 08/07/2016  . Otitis externa 06/07/2011  . Palpitations   . Sciatica of left side 09/01/2017  . Sensation of feeling cold 07/27/2014  . SI (sacroiliac) joint dysfunction 09/01/2017  . Thyroid disease   . Viral URI with cough 09/07/2013    Past Surgical History:  Procedure Laterality Date  . BREAST BIOPSY Right 08/24/2014   negative, u/s core  . CESAREAN SECTION     x3  . HERNIA REPAIR     umbilical  . UMBILICAL HERNIA REPAIR      There were no vitals filed for this visit.   Subjective Assessment - 07/18/20 0905    Subjective Reports some days are great, but sometimes she has severe pain. She reports only 3/10 pain this morning. Patient says she had sciatic pain in LLE Sunday night at work, but that is the only time in the past week. She had 4 days off and after hours on her feet she thinks this is what brought  on her pain. She was unable to complete HEP to rid of pain d/t business at work.    Pertinent History Patient is a 49 year old female presenting with LBP "that goes into my coccyx" over the past 2 years, but that it has been getting worse over time insideous onset. Pain is made worse when she completes exercises (lifting weights and "workout videos") with pain with exercises that make her "lift her legs", standing >2hours, walking >1 hour,  or bending forward. 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 At work she has increased pain with standing and walking, and has to take frequent breaks. Pt reports pain originates in LB and radiates into BLE making "thighs sore/aching" with LLE buring/electrical sensation to foot with standing/walking for a long time. Worst pain 10/10 best 0/10. Pt denies N/V, B&B changes, unexplained weight fluctuation, saddle paresthesia, fever, night sweats, or unrelenting night pain at this time.    Limitations Walking;Standing    How long can you sit comfortably? NA    How long can you stand comfortably? >2hours    How long can you walk comfortably? >1 hour    Diagnostic tests MRI in Feb 2019: L4-5 left  central/subarticular disc protrusion with impingement to L5 nerve root;     Patient Stated Goals Complete work without severe pain    Pain Onset More than a month ago           Ther-Ex Nustep L3 seat 6 UE 8 47mins for gentle strengthening and lumbar rotation Lower trunk rotations x15 with cuing for lumbopelvic dissociation with good carry over [place] with alt marching 3x 10 with cuing to maintain hip elevation; following last set reports some increased sciatic pain, relieved with supine sciatic nerve glide 30sec Straight leg cable pull through 25# cable 3x 8 with good carry over of technique from demo  [employer] with demo and cuing needed for initial set up with  good carry over; Alt hip ext in plank 2x 10 with cuing needed to maintain core bracing and neutral spine with good carry over Seated tball childs pose 6x 10sec hold                          PT Education - 07/18/20 0917    Education Details therex form/technique    Person(s) Educated Patient    Methods Explanation;Demonstration;Verbal cues    Comprehension Verbalized understanding;Returned demonstration;Verbal cues required            PT Short Term Goals - 06/12/20 1140      PT SHORT TERM GOAL #1   Title Pt will be independent with HEP in order to improve strength and decrease back pain in order to improve pain-free function at home and work.    Baseline 06/12/20 HEP given    Time 4    Period Weeks    Status New             PT Long Term Goals - 06/12/20 1140      PT LONG TERM GOAL #1   Title Pt will decrease worst back pain as reported on NPRS by at least 2 points in order to demonstrate clinically significant reduction in back pain.    Baseline 06/12/20 10/10 LBP with L sided sciatica    Time 8    Period Weeks    Status New      PT LONG TERM GOAL #2   Title Pt will increase L hip abd and ext strength of to at least 4/5 MMT grade in order to demonstrate improvement in strength and function.    Baseline 06/12/20 L hip abd 4-; ext 3+    Time 8    Period Weeks    Status New      PT LONG TERM GOAL #3   Title Patient will increase FOTO score to 74 to demonstrate predicted increase in functional mobility to complete ADLs    Baseline 06/12/20 70    Time 8    Period Weeks    Status New                  Patient will benefit from skilled therapeutic intervention in order to improve the following deficits and impairments:     Visit Diagnosis: Chronic midline low back pain with left-sided sciatica     Problem List Patient Active Problem List   Diagnosis Date Noted  . Postcoital UTI 05/09/2020  . Skin lesion of back 05/09/2020  .  Bilateral foot pain 08/16/2019  . Hot flashes 08/16/2019  . SI (sacroiliac) joint dysfunction 09/01/2017  . Sciatica of left side 09/01/2017  . Osteoarthritis of both knees  08/07/2016  . Gastroesophageal reflux disease 08/07/2016  . Palpitations 07/27/2014   Durwin Reges DPT Durwin Reges 07/18/2020, 11:18 AM  [facility] [address] [place], [address] Phone: [phone]   Fax:  [phone]  Name: Jennifer Ware MRN: [id number] Date of Birth: [birth year]

## 2020-07-20 ENCOUNTER — Encounter: Payer: Self-pay | Admitting: Physical Therapy

## 2020-07-20 ENCOUNTER — Other Ambulatory Visit: Payer: Self-pay

## 2020-07-20 ENCOUNTER — Ambulatory Visit: Payer: No Typology Code available for payment source | Admitting: Physical Therapy

## 2020-07-20 DIAGNOSIS — G8929 Other chronic pain: Secondary | ICD-10-CM

## 2020-07-20 DIAGNOSIS — M5442 Lumbago with sciatica, left side: Secondary | ICD-10-CM | POA: Diagnosis not present

## 2020-07-20 NOTE — Therapy (Signed)
[facility] [address] [place], [address] Phone: [phone]   Fax:  [phone]  Physical Therapy Treatment  Patient Details  Name: Jennifer Ware MRN: [id number] Date of Birth: 1970/12/13 No data recorded  Encounter Date: 07/20/2020   PT End of Session - 07/20/20 0921    Visit Number 8    Number of Visits 17    Date for PT Re-Evaluation 08/11/20    PT Start Time 0900    PT Stop Time 0938    PT Time Calculation (min) 38 min    Activity Tolerance Patient tolerated treatment well;No increased pain    Behavior During Therapy WFL for tasks assessed/performed           Past Medical History:  Diagnosis Date  . Abdominal pain 10/29/2011  . Amenorrhea 10/22/2011  . Anxiety   . Chronic UTI 12/24/2011  . Dyspareunia 04/19/2014  . Ear discomfort 07/18/2011  . GERD (gastroesophageal reflux disease)   . HTN (hypertension) 04/25/2014  . Hx gestational diabetes   . Irregular periods/menstrual cycles 03/04/2014  . Knee crepitus 07/27/2014  . Low back pain 06/07/2011  . Osteoarthritis of both knees 08/07/2016  . Otitis externa 06/07/2011  . Palpitations   . Sciatica of left side 09/01/2017  . Sensation of feeling cold 07/27/2014  . SI (sacroiliac) joint dysfunction 09/01/2017  . Thyroid disease   . Viral URI with cough 09/07/2013    Past Surgical History:  Procedure Laterality Date  . BREAST BIOPSY Right 08/24/2014   negative, u/s core  . CESAREAN SECTION     x3  . HERNIA REPAIR     umbilical  . UMBILICAL HERNIA REPAIR      There were no vitals filed for this visit.   Subjective Assessment - 07/20/20 0907    Subjective Patient reports she went to the gym after work today and rode the bike for 89mins. REports she had some LBP last night at her shift that stopped after sitting for a minute. Denies periphrealization which is an improvement.    Pertinent History Patient is a 49 year old female presenting with  LBP "that goes into my coccyx" over the past 2 years, but that it has been getting worse over time insideous onset. Pain is made worse when she completes exercises (lifting weights and "workout videos") with pain with exercises that make her "lift her legs", standing >2hours, walking >1 hour,  or bending forward. 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 At work she has increased pain with standing and walking, and has to take frequent breaks. Pt reports pain originates in LB and radiates into BLE making "thighs sore/aching" with LLE buring/electrical sensation to foot with standing/walking for a long time. Worst pain 10/10 best 0/10. Pt denies N/V, B&B changes, unexplained weight fluctuation, saddle paresthesia, fever, night sweats, or unrelenting night pain at this time.    Limitations Walking;Standing    How long can you sit comfortably? NA    How long can you stand comfortably? >2hours    How long can you walk comfortably? >1 hour    Diagnostic tests MRI in Feb 2019: L4-5 left central/subarticular disc protrusion with impingement to L5 nerve root;     Patient Stated Goals Complete work without severe pain    Pain Onset More than a month ago  Ther-Ex Nustep L3seat 6 UE 8 13mins for gentle strengthening and lumbar rotation Lower trunk rotations x15 with cuing for lumbopelvic dissociation with good carry over [place] Dead bug theraball squeezes x12 3sec hold; deadbug with tball for TC 3x 12 with min cuing throughout for technique Bridge with alt marching 3x 10 with cuing to maintain hip elevation; following last set reports some increased sciatic pain, relieved with supine sciatic nerve glide 30sec Straight leg cable pull through 25# cable 3x 10 with good carry over of technique from demo  Alt hip ext in plank 2x 10 with min cuing initially for set up posture with good carry  over Seated tball childs pose 6x 10sec hold                        PT Education - 07/20/20 0912    Education Details therex form/technique    Person(s) Educated Patient    Methods Explanation;Demonstration;Verbal cues    Comprehension Verbalized understanding;Returned demonstration;Verbal cues required            PT Short Term Goals - 06/12/20 1140      PT SHORT TERM GOAL #1   Title Pt will be independent with HEP in order to improve strength and decrease back pain in order to improve pain-free function at home and work.    Baseline 06/12/20 HEP given    Time 4    Period Weeks    Status New             PT Long Term Goals - 06/12/20 1140      PT LONG TERM GOAL #1   Title Pt will decrease worst back pain as reported on NPRS by at least 2 points in order to demonstrate clinically significant reduction in back pain.    Baseline 06/12/20 10/10 LBP with L sided sciatica    Time 8    Period Weeks    Status New      PT LONG TERM GOAL #2   Title Pt will increase L hip abd and ext strength of to at least 4/5 MMT grade in order to demonstrate improvement in strength and function.    Baseline 06/12/20 L hip abd 4-; ext 3+    Time 8    Period Weeks    Status New      PT LONG TERM GOAL #3   Title Patient will increase FOTO score to 74 to demonstrate predicted increase in functional mobility to complete ADLs    Baseline 06/12/20 70    Time 8    Period Weeks    Status New                 Plan - 07/20/20 0933    Clinical Impression Statement PT continued therex progression for increased core and hp strength with success. Patient is demonstrating good centralization of symptoms between sessions. Patient is able to complete all therex with minimal corrections needed, and good carry over of all cuing provided. PT will continue progression as able.    Personal Factors and Comorbidities Comorbidity 1;Comorbidity 2;Comorbidity 3+;Profession;Past/Current  Experience;Time since onset of injury/illness/exacerbation;Sex    Comorbidities HTN, GERD, anxiety, chronic LBP    Examination-Activity Limitations Stand;Lift;Caring for Others;Squat;Locomotion Level    Examination-Participation Restrictions Cleaning;Occupation;Community Activity;Meal Prep    Stability/Clinical Decision Making Evolving/Moderate complexity    Clinical Decision Making Moderate    Rehab Potential Good    PT Frequency 2x / week  PT Duration 8 weeks    PT Treatment/Interventions ADLs/Self Care Home Management;Cryotherapy;Electrical Stimulation;Moist Heat;Traction;Therapeutic exercise;Therapeutic activities;Functional mobility training;Patient/family education;Gait training;Ultrasound;Iontophoresis 4mg /ml Dexamethasone;DME Instruction;Balance training;Neuromuscular re-education;Manual techniques;Passive range of motion;Dry needling;Joint Manipulations    PT Next Visit Plan continue POC    PT Home Exercise Plan repeated flex, SL bridge,  piriformis stretch    Consulted and Agree with Plan of Care Patient           Patient will benefit from skilled therapeutic intervention in order to improve the following deficits and impairments:  Decreased endurance,Hypomobility,Decreased activity tolerance,Decreased strength,Pain,Improper body mechanics,Decreased coordination,Increased fascial restricitons,Impaired flexibility,Postural dysfunction,Decreased range of motion,Abnormal gait,Decreased balance,Difficulty walking  Visit Diagnosis: Chronic midline low back pain with left-sided sciatica     Problem List Patient Active Problem List   Diagnosis Date Noted  . Postcoital UTI 05/09/2020  . Skin lesion of back 05/09/2020  . Bilateral foot pain 08/16/2019  . Hot flashes 08/16/2019  . SI (sacroiliac) joint dysfunction 09/01/2017  . Sciatica of left side 09/01/2017  . Osteoarthritis of both knees 08/07/2016  . Gastroesophageal reflux disease 08/07/2016  . Palpitations 07/27/2014    Durwin Reges DPT Durwin Reges 07/20/2020, 9:42 AM  [facility] [address] [place], [address] Phone: [phone]   Fax:  [phone]  Name: Jennifer Ware MRN: [id number] Date of Birth: 1971/05/07

## 2020-07-24 ENCOUNTER — Ambulatory Visit: Payer: No Typology Code available for payment source | Admitting: Physical Therapy

## 2020-07-27 ENCOUNTER — Other Ambulatory Visit: Payer: Self-pay

## 2020-07-27 ENCOUNTER — Ambulatory Visit: Payer: No Typology Code available for payment source | Admitting: Physical Therapy

## 2020-07-27 ENCOUNTER — Encounter: Payer: Self-pay | Admitting: Physical Therapy

## 2020-07-27 DIAGNOSIS — M5442 Lumbago with sciatica, left side: Secondary | ICD-10-CM | POA: Diagnosis not present

## 2020-07-27 NOTE — Therapy (Signed)
[facility] [address] [place], [address] Phone: [phone]   Fax:  [phone]  Physical Therapy Treatment/Progress Note Reporting Period 06/12/20 - 07/27/20  Patient Details  Name: Jennifer Ware MRN: [id number] Date of Birth: 1970/12/31 No data recorded  Encounter Date: 07/27/2020   PT End of Session - 07/27/20 0905    Visit Number 9    Number of Visits 17    Date for PT Re-Evaluation 08/11/20    PT Start Time 0900    PT Stop Time 0945    PT Time Calculation (min) 45 min    Activity Tolerance Patient tolerated treatment well;No increased pain    Behavior During Therapy WFL for tasks assessed/performed           Past Medical History:  Diagnosis Date  . Abdominal pain 10/29/2011  . Amenorrhea 10/22/2011  . Anxiety   . Chronic UTI 12/24/2011  . Dyspareunia 04/19/2014  . Ear discomfort 07/18/2011  . GERD (gastroesophageal reflux disease)   . HTN (hypertension) 04/25/2014  . Hx gestational diabetes   . Irregular periods/menstrual cycles 03/04/2014  . Knee crepitus 07/27/2014  . Low back pain 06/07/2011  . Osteoarthritis of both knees 08/07/2016  . Otitis externa 06/07/2011  . Palpitations   . Sciatica of left side 09/01/2017  . Sensation of feeling cold 07/27/2014  . SI (sacroiliac) joint dysfunction 09/01/2017  . Thyroid disease   . Viral URI with cough 09/07/2013    Past Surgical History:  Procedure Laterality Date  . BREAST BIOPSY Right 08/24/2014   negative, u/s core  . CESAREAN SECTION     x3  . HERNIA REPAIR     umbilical  . UMBILICAL HERNIA REPAIR      There were no vitals filed for this visit.   Subjective Assessment - 07/27/20 0904    Subjective Pt reports her pain has been better overall, that she had some increased pain during a hectic work shift.    Pertinent History Patient is a 49 year old female presenting with LBP "that goes into my coccyx" over the past 2 years, but that  it has been getting worse over time insideous onset. Pain is made worse when she completes exercises (lifting weights and "workout videos") with pain with exercises that make her "lift her legs", standing >2hours, walking >1 hour,  or bending forward. 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 At work she has increased pain with standing and walking, and has to take frequent breaks. Pt reports pain originates in LB and radiates into BLE making "thighs sore/aching" with LLE buring/electrical sensation to foot with standing/walking for a long time. Worst pain 10/10 best 0/10. Pt denies N/V, B&B changes, unexplained weight fluctuation, saddle paresthesia, fever, night sweats, or unrelenting night pain at this time.    Limitations Walking;Standing    How long can you sit comfortably? NA    How long can you stand comfortably? >2hours    How long can you walk comfortably? >1 hour    Diagnostic tests MRI in Feb 2019: L4-5 left central/subarticular disc protrusion with impingement to L5 nerve root;     Patient Stated Goals Complete work without severe pain    Pain Onset More than a month ago             Ther-Ex Nustep L3seat 6 UE 8  59mns for gentle strengthening and lumbar rotation PT reviewed the following HEP with patient with patient able to demonstrate a set of the following with min cuing for correction needed. PT educated patient on parameters of therex (how/when to inc/decrease intensity, frequency, rep/set range, stretch hold time, and purpose of therex) with verbalized understanding.  Access Code: [email or web address] Sciatic Nerve Glide - 4-5 x daily - 7 x weekly - 15 reps Seated Slump Nerve Glide - 4-5 x daily - 7 x weekly - 15 reps Seated Piriformis Stretch with Trunk Bend - 3 x daily - 7 x weekly - 60sec hold Single Leg Bridge - 1 x daily - 1-2 x weekly - 3 sets - 10 reps Supine Dead Bug with Leg  Extension - 1 x daily - 1-2 x weekly - 3 sets - 10 reps Single Leg Squat with Chair Touch - 1 x daily - 1-2 x weekly - 3 sets - 5 reps         PT Education - 07/27/20 0905    Education Details therex form/technique    Person(s) Educated Patient    Methods Explanation;Demonstration;Verbal cues    Comprehension Verbalized understanding;Returned demonstration;Verbal cues required            PT Short Term Goals - 07/27/20 0907      PT SHORT TERM GOAL #1   Title Pt will be independent with HEP in order to improve strength and decrease back pain in order to improve pain-free function at home and work.    Baseline 06/12/20 HEP given; 07/27/20 completing HEP    Time 4    Period Weeks    Status Achieved             PT Long Term Goals - 07/27/20 08099     PT LONG TERM GOAL #1   Title Pt will decrease worst back pain as reported on NPRS by at least 2 points in order to demonstrate clinically significant reduction in back pain.    Baseline 06/12/20 10/10 LBP with L sided sciatica; 07/27/20 reports occassional 5/10 pain without sciatica symptoms past buttock/thigh    Time 8    Period Weeks    Status Achieved      PT LONG TERM GOAL #2   Title Pt will increase L hip abd and ext strength of to at least 4/5 MMT grade in order to demonstrate improvement in strength and function.    Baseline 06/12/20 L hip abd 4-; ext 3+; 07/27/20 R/L abd 5/5 ext 4+/4    Time 8    Period Weeks    Status Achieved      PT LONG TERM GOAL #3   Title Patient will increase FOTO score to 74 to demonstrate predicted increase in functional mobility to complete ADLs    Baseline 06/12/20 70; 07/27/20 75    Time 8    Period Weeks    Status Achieved                 Plan - 07/27/20 0944    Clinical Impression Statement PT reassessed patient goals this session where patient has met all goals, but does continue to have pain and needing cuing for therex technique. PT and patient discussed increasing HEP  demand and reducing PT frequency to 1x/week to promote independence with good understanding from patient. Patient is able to complete all therex with proper technique with multimodal cuing with good motivation and no increased pain. PT educated patient on pain reduction as  well for work with good understanding. PT will continue progression as able.    Personal Factors and Comorbidities Comorbidity 1;Comorbidity 2;Comorbidity 3+;Profession;Past/Current Experience;Time since onset of injury/illness/exacerbation;Sex    Comorbidities HTN, GERD, anxiety, chronic LBP    Examination-Activity Limitations Stand;Lift;Caring for Others;Squat;Locomotion Level    Examination-Participation Restrictions Cleaning;Occupation;Community Activity;Meal Prep    Stability/Clinical Decision Making Evolving/Moderate complexity    Clinical Decision Making Moderate    Rehab Potential Good    PT Frequency 2x / week    PT Duration 8 weeks    PT Treatment/Interventions ADLs/Self Care Home Management;Cryotherapy;Electrical Stimulation;Moist Heat;Traction;Therapeutic exercise;Therapeutic activities;Functional mobility training;Patient/family education;Gait training;Ultrasound;Iontophoresis 21m/ml Dexamethasone;DME Instruction;Balance training;Neuromuscular re-education;Manual techniques;Passive range of motion;Dry needling;Joint Manipulations    PT Next Visit Plan continue POC    PT Home Exercise Plan repeated flex, SL bridge,  piriformis stretch    Consulted and Agree with Plan of Care Patient           Patient will benefit from skilled therapeutic intervention in order to improve the following deficits and impairments:  Decreased endurance,Hypomobility,Decreased activity tolerance,Decreased strength,Pain,Improper body mechanics,Decreased coordination,Increased fascial restricitons,Impaired flexibility,Postural dysfunction,Decreased range of motion,Abnormal gait,Decreased balance,Difficulty walking  Visit Diagnosis: Chronic  midline low back pain with left-sided sciatica     Problem List Patient Active Problem List   Diagnosis Date Noted  . Postcoital UTI 05/09/2020  . Skin lesion of back 05/09/2020  . Bilateral foot pain 08/16/2019  . Hot flashes 08/16/2019  . SI (sacroiliac) joint dysfunction 09/01/2017  . Sciatica of left side 09/01/2017  . Osteoarthritis of both knees 08/07/2016  . Gastroesophageal reflux disease 08/07/2016  . Palpitations 07/27/2014   CDurwin RegesDPT CDurwin Reges12/16/2021, 10:23 AM  [facility] [address] [place] [address]Phone: [phone]  Fax:  [phone] Name: CHonor FrisonMRN: [id number] of Birth: 815-Feb-1972

## 2020-07-31 ENCOUNTER — Encounter: Payer: No Typology Code available for payment source | Admitting: Physical Therapy

## 2020-08-02 ENCOUNTER — Encounter: Payer: Self-pay | Admitting: Physical Therapy

## 2020-08-02 ENCOUNTER — Other Ambulatory Visit: Payer: Self-pay

## 2020-08-02 ENCOUNTER — Ambulatory Visit: Payer: No Typology Code available for payment source | Admitting: Physical Therapy

## 2020-08-02 DIAGNOSIS — M5442 Lumbago with sciatica, left side: Secondary | ICD-10-CM | POA: Diagnosis not present

## 2020-08-02 NOTE — Therapy (Signed)
[facility] [address] [place], [address] Phone: [phone]   Fax:  [phone]  Physical Therapy Treatment  Patient Details  Name: Jennifer Ware MRN: [id number] Date of Birth: 09-Sep-1970 No data recorded  Encounter Date: 08/02/2020   PT End of Session - 08/02/20 1531    Visit Number 10    Number of Visits 17    Date for PT Re-Evaluation 08/11/20    PT Start Time 0315    PT Stop Time 0400    PT Time Calculation (min) 45 min    Activity Tolerance Patient tolerated treatment well;No increased pain    Behavior During Therapy [facility] for tasks assessed/performed           Past Medical History:  Diagnosis Date   Abdominal pain 10/29/2011   Amenorrhea 10/22/2011   Anxiety    Chronic UTI 12/24/2011   Dyspareunia 04/19/2014   Ear discomfort 07/18/2011   GERD (gastroesophageal reflux disease)    HTN (hypertension) 04/25/2014   Hx gestational diabetes    Irregular periods/menstrual cycles 03/04/2014   Knee crepitus 07/27/2014   Low back pain 06/07/2011   Osteoarthritis of both knees 08/07/2016   Otitis externa 06/07/2011   Palpitations    Sciatica of left side 09/01/2017   Sensation of feeling cold 07/27/2014   SI (sacroiliac) joint dysfunction 09/01/2017   Thyroid disease    Viral URI with cough 09/07/2013    Past Surgical History:  Procedure Laterality Date   BREAST BIOPSY Right 08/24/2014   negative, u/s core   CESAREAN SECTION     x3   HERNIA REPAIR     umbilical   UMBILICAL HERNIA REPAIR      There were no vitals filed for this visit.   Subjective Assessment - 08/02/20 1524    Subjective Reports she has felt good this week because she has not been at work. Has been completing new HEP well.    Pertinent History Patient is a 49 year old female presenting with LBP "that goes into my coccyx" over the past 2 years, but that it has been getting worse over time insideous onset.  Pain is made worse when she completes exercises (lifting weights and "workout videos") with pain with exercises that make her "lift her legs", standing >2hours, walking >1 hour,  or bending forward. 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 At work she has increased pain with standing and walking, and has to take frequent breaks. Pt reports pain originates in LB and radiates into BLE making "thighs sore/aching" with LLE buring/electrical sensation to foot with standing/walking for a long time. Worst pain 10/10 best 0/10. Pt denies N/V, B&B changes, unexplained weight fluctuation, saddle paresthesia, fever, night sweats, or unrelenting night pain at this time.    Limitations Walking;Standing    How long can you sit comfortably? NA    How long can you stand comfortably? >2hours    How long can you walk comfortably? >1 hour    Diagnostic tests MRI in Feb 2019: L4-5 left central/subarticular disc protrusion with impingement to L5 nerve root;     Patient Stated Goals Complete work without severe pain    Pain Onset More than a month ago            Ther-Ex Nustep L3seat 6 UE 8 11mins for gentle strengthening and lumbar  rotation Supine Sciatic Nerve Glide x12 Seated Slump Nerve Glide x12 Single Leg Bridge x10 with min cuing for inc ext with good carry over Supine Dead Bug with Leg Extension x10 with good carry over from previous session Single Leg Squat with Chair Touch x10 SL hip hinge x10 with 10# 2x 10 with good carry over of demo and cuing 20# KB swings (squat) 3x 10 with min cuing for technique and equal WB with good carry over Unilateral farmers carry x4 (2 R 2 in L) with min cuing for set up posture and core activation with good carry over following Seated piriformis stretch                             PT Education - 08/02/20 1530    Education Details therex form/technique     Person(s) Educated Patient    Methods Explanation;Demonstration;Verbal cues    Comprehension Verbalized understanding;Returned demonstration;Verbal cues required            PT Short Term Goals - 07/27/20 0907      PT SHORT TERM GOAL #1   Title Pt will be independent with HEP in order to improve strength and decrease back pain in order to improve pain-free function at home and work.    Baseline 06/12/20 HEP given; 07/27/20 completing HEP    Time 4    Period Weeks    Status Achieved             PT Long Term Goals - 07/27/20 6761      PT LONG TERM GOAL #1   Title Pt will decrease worst back pain as reported on NPRS by at least 2 points in order to demonstrate clinically significant reduction in back pain.    Baseline 06/12/20 10/10 LBP with L sided sciatica; 07/27/20 reports occassional 5/10 pain without sciatica symptoms past buttock/thigh    Time 8    Period Weeks    Status Achieved      PT LONG TERM GOAL #2   Title Pt will increase L hip abd and ext strength of to at least 4/5 MMT grade in order to demonstrate improvement in strength and function.    Baseline 06/12/20 L hip abd 4-; ext 3+; 07/27/20 R/L abd 5/5 ext 4+/4    Time 8    Period Weeks    Status Achieved      PT LONG TERM GOAL #3   Title Patient will increase FOTO score to 74 to demonstrate predicted increase in functional mobility to complete ADLs    Baseline 06/12/20 70; 07/27/20 75    Time 8    Period Weeks    Status Achieved                 Plan - 08/02/20 1534    Clinical Impression Statement PT reviewed HEP for increased ind with patient with patient able to demonstrate good carry over of technique of HEP with minimal correction needed. PT led patient through functional movements for increased hip and core stability with good carry over of all cuing and good motivation from patient throughout session. PT will continue progression as able.    Personal Factors and Comorbidities Comorbidity  1;Comorbidity 2;Comorbidity 3+;Profession;Past/Current Experience;Time since onset of injury/illness/exacerbation;Sex    Comorbidities HTN, GERD, anxiety, chronic LBP    Examination-Activity Limitations Stand;Lift;Caring for Others;Squat;Locomotion Level    Examination-Participation Restrictions Cleaning;Occupation;Community Activity;Meal Prep    Stability/Clinical Decision Making Evolving/Moderate complexity  Clinical Decision Making Moderate    Rehab Potential Good    PT Frequency 2x / week    PT Duration 8 weeks    PT Treatment/Interventions ADLs/Self Care Home Management;Cryotherapy;Electrical Stimulation;Moist Heat;Traction;Therapeutic exercise;Therapeutic activities;Functional mobility training;Patient/family education;Gait training;Ultrasound;Iontophoresis 4mg /ml Dexamethasone;DME Instruction;Balance training;Neuromuscular re-education;Manual techniques;Passive range of motion;Dry needling;Joint Manipulations    PT Next Visit Plan continue POC    PT Home Exercise Plan repeated flex, SL bridge,  piriformis stretch    Consulted and Agree with Plan of Care Patient           Patient will benefit from skilled therapeutic intervention in order to improve the following deficits and impairments:  Decreased endurance,Hypomobility,Decreased activity tolerance,Decreased strength,Pain,Improper body mechanics,Decreased coordination,Increased fascial restricitons,Impaired flexibility,Postural dysfunction,Decreased range of motion,Abnormal gait,Decreased balance,Difficulty walking  Visit Diagnosis: Chronic midline low back pain with left-sided sciatica     Problem List Patient Active Problem List   Diagnosis Date Noted   Postcoital UTI 05/09/2020   Skin lesion of back 05/09/2020   Bilateral foot pain 08/16/2019   Hot flashes 08/16/2019   SI (sacroiliac) joint dysfunction 09/01/2017   Sciatica of left side 09/01/2017   Osteoarthritis of both knees 08/07/2016   Gastroesophageal  reflux disease 08/07/2016   Palpitations 07/27/2014   07/29/2014 DPT Hilda Lias 08/02/2020, 3:56 PM   [facility] [address], [address], [place] Phone: [phone]   Fax:  [phone]  Name: Kenidee Cregan MRN: Crisoforo Oxford Date of Birth: [birth year]

## 2020-08-03 ENCOUNTER — Encounter: Payer: No Typology Code available for payment source | Admitting: Physical Therapy

## 2020-08-07 ENCOUNTER — Encounter: Payer: No Typology Code available for payment source | Admitting: Physical Therapy

## 2020-08-09 ENCOUNTER — Encounter: Payer: Self-pay | Admitting: Physical Therapy

## 2020-08-09 ENCOUNTER — Ambulatory Visit: Payer: No Typology Code available for payment source | Admitting: Physical Therapy

## 2020-08-09 ENCOUNTER — Other Ambulatory Visit: Payer: Self-pay

## 2020-08-09 DIAGNOSIS — G8929 Other chronic pain: Secondary | ICD-10-CM

## 2020-08-09 DIAGNOSIS — M5442 Lumbago with sciatica, left side: Secondary | ICD-10-CM | POA: Diagnosis not present

## 2020-08-09 NOTE — Therapy (Signed)
[facility] [address], [place], [address] Phone: [phone]   Fax:  [phone]  Physical Therapy Treatment/DC Summary Reporting Period 07/27/20 - 08/09/20  Patient Details  Name: Jennifer Ware MRN: [id number] Date of Birth: Dec 30, 1970 No data recorded  Encounter Date: 08/09/2020   PT End of Session - 08/09/20 0747    Visit Number 11    Number of Visits 17    Date for PT Re-Evaluation 08/11/20    PT Start Time 0738    PT Stop Time 0816    PT Time Calculation (min) 38 min    Activity Tolerance Patient tolerated treatment well;No increased pain    Behavior During Therapy WFL for tasks assessed/performed           Past Medical History:  Diagnosis Date  . Abdominal pain 10/29/2011  . Amenorrhea 10/22/2011  . Anxiety   . Chronic UTI 12/24/2011  . Dyspareunia 04/19/2014  . Ear discomfort 07/18/2011  . GERD (gastroesophageal reflux disease)   . HTN (hypertension) 04/25/2014  . Hx gestational diabetes   . Irregular periods/menstrual cycles 03/04/2014  . Knee crepitus 07/27/2014  . Low back pain 06/07/2011  . Osteoarthritis of both knees 08/07/2016  . Otitis externa 06/07/2011  . Palpitations   . Sciatica of left side 09/01/2017  . Sensation of feeling cold 07/27/2014  . SI (sacroiliac) joint dysfunction 09/01/2017  . Thyroid disease   . Viral URI with cough 09/07/2013    Past Surgical History:  Procedure Laterality Date  . BREAST BIOPSY Right 08/24/2014   negative, u/s core  . CESAREAN SECTION     x3  . HERNIA REPAIR     umbilical  . UMBILICAL HERNIA REPAIR      There were no vitals filed for this visit.   Subjective Assessment - 08/09/20 0742    Subjective Reports 3/10 pain currently. She reports that she realizes that when she is stressed it makes her pain worse. Is completing HEP.    Pertinent History Patient is a 49 year old female presenting with LBP "that goes into my coccyx" over the  past 2 years, but that it has been getting worse over time insideous onset. Pain is made worse when she completes exercises (lifting weights and "workout videos") with pain with exercises that make her "lift her legs", standing >2hours, walking >1 hour,  or bending forward. 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 At work she has increased pain with standing and walking, and has to take frequent breaks. Pt reports pain originates in LB and radiates into BLE making "thighs sore/aching" with LLE buring/electrical sensation to foot with standing/walking for a long time. Worst pain 10/10 best 0/10. Pt denies N/V, B&B changes, unexplained weight fluctuation, saddle paresthesia, fever, night sweats, or unrelenting night pain at this time.    Limitations Walking;Standing    How long can you sit comfortably? NA    How long can you stand comfortably? >2hours    How long can you walk comfortably? >1 hour    Diagnostic tests MRI in Feb 2019: L4-5 left central/subarticular disc protrusion with impingement to L5 nerve root;     Patient Stated Goals Complete work without severe pain             Ther-Ex Nustep L3seat 6 UE 8 61mns for gentle strengthening and lumbar rotation  Single Leg Squat with Chair Touch 3 x10 bilat with cuing for increased depth with some carry over Inchworms 2x 10 with good carry over of plank position and hold for hamstring stretch Visual review of HEP with patient verbalizing good understanding.                             PT Education - 08/09/20 0746    Education Details therex form/technique    Person(s) Educated Patient    Methods Explanation;Demonstration;Verbal cues    Comprehension Verbalized understanding;Returned demonstration;Verbal cues required            PT Short Term Goals - 07/27/20 0907      PT SHORT TERM GOAL #1   Title Pt will be  independent with HEP in order to improve strength and decrease back pain in order to improve pain-free function at home and work.    Baseline 06/12/20 HEP given; 07/27/20 completing HEP    Time 4    Period Weeks    Status Achieved             PT Long Term Goals - 07/27/20 3785      PT LONG TERM GOAL #1   Title Pt will decrease worst back pain as reported on NPRS by at least 2 points in order to demonstrate clinically significant reduction in back pain.    Baseline 06/12/20 10/10 LBP with L sided sciatica; 07/27/20 reports occassional 5/10 pain without sciatica symptoms past buttock/thigh    Time 8    Period Weeks    Status Achieved      PT LONG TERM GOAL #2   Title Pt will increase L hip abd and ext strength of to at least 4/5 MMT grade in order to demonstrate improvement in strength and function.    Baseline 06/12/20 L hip abd 4-; ext 3+; 07/27/20 R/L abd 5/5 ext 4+/4    Time 8    Period Weeks    Status Achieved      PT LONG TERM GOAL #3   Title Patient will increase FOTO score to 74 to demonstrate predicted increase in functional mobility to complete ADLs    Baseline 06/12/20 70; 07/27/20 75    Time 8    Period Weeks    Status Achieved                 Plan - 08/09/20 0810    Clinical Impression Statement PT led patient through therex for increased core and hip strength with success, and reviewed HEP for maintenance of progress made through PT. Pt to d/c PT to HEP as all goals are met and she is able to demonstrate good HEP understanding.    Personal Factors and Comorbidities Comorbidity 1;Comorbidity 2;Comorbidity 3+;Profession;Past/Current Experience;Time since onset of injury/illness/exacerbation;Sex    Comorbidities HTN, GERD, anxiety, chronic LBP    Examination-Activity Limitations Stand;Lift;Caring for Others;Squat;Locomotion Level    Examination-Participation Restrictions Cleaning;Occupation;Community Activity;Meal Prep    Stability/Clinical Decision Making  Evolving/Moderate complexity    Clinical Decision Making Moderate    Rehab Potential Good    PT Frequency 2x / week    PT Duration 8 weeks    PT Treatment/Interventions ADLs/Self Care Home Management;Cryotherapy;Electrical Stimulation;Moist Heat;Traction;Therapeutic exercise;Therapeutic activities;Functional mobility training;Patient/family education;Gait training;Ultrasound;Iontophoresis 49m/ml Dexamethasone;DME Instruction;Balance training;Neuromuscular re-education;Manual techniques;Passive range of motion;Dry needling;Joint Manipulations    PT Next Visit Plan continue POC    PT Home Exercise Plan repeated flex, SL bridge,  piriformis stretch  Consulted and Agree with Plan of Care Patient           Patient will benefit from skilled therapeutic intervention in order to improve the following deficits and impairments:  Decreased endurance,Hypomobility,Decreased activity tolerance,Decreased strength,Pain,Improper body mechanics,Decreased coordination,Increased fascial restricitons,Impaired flexibility,Postural dysfunction,Decreased range of motion,Abnormal gait,Decreased balance,Difficulty walking  Visit Diagnosis: Chronic midline low back pain with left-sided sciatica     Problem List Patient Active Problem List   Diagnosis Date Noted  . Postcoital UTI 05/09/2020  . Skin lesion of back 05/09/2020  . Bilateral foot pain 08/16/2019  . Hot flashes 08/16/2019  . SI (sacroiliac) joint dysfunction 09/01/2017  . Sciatica of left side 09/01/2017  . Osteoarthritis of both knees 08/07/2016  . Gastroesophageal reflux disease 08/07/2016  . Palpitations 07/27/2014   Durwin Reges DPT Durwin Reges 08/09/2020, 8:15 AM  [facility] [address] [place], [address] Phone: [phone]   Fax:  [phone]  Name: Mylinda Brook MRN: [id number] Date of Birth: August 15, 1970

## 2020-08-10 ENCOUNTER — Encounter: Payer: No Typology Code available for payment source | Admitting: Physical Therapy

## 2020-08-14 ENCOUNTER — Encounter: Payer: No Typology Code available for payment source | Admitting: Physical Therapy

## 2020-08-16 ENCOUNTER — Other Ambulatory Visit (HOSPITAL_COMMUNITY)
Admission: RE | Admit: 2020-08-16 | Discharge: 2020-08-16 | Disposition: A | Discharge status: Home / Self Care | Payer: No Typology Code available for payment source | Source: Ambulatory Visit | Attending: Family Medicine | Admitting: Family Medicine

## 2020-08-16 ENCOUNTER — Ambulatory Visit (INDEPENDENT_AMBULATORY_CARE_PROVIDER_SITE_OTHER): Payer: No Typology Code available for payment source | Admitting: Family Medicine

## 2020-08-16 ENCOUNTER — Other Ambulatory Visit: Payer: Self-pay | Admitting: Family Medicine

## 2020-08-16 ENCOUNTER — Other Ambulatory Visit: Payer: Self-pay

## 2020-08-16 ENCOUNTER — Telehealth: Payer: Self-pay | Admitting: *Deleted

## 2020-08-16 VITALS — BP 100/80 | HR 85 | Temp 97.3°F | Ht 62.5 in | Wt 156.0 lb

## 2020-08-16 DIAGNOSIS — M79671 Pain in right foot: Secondary | ICD-10-CM | POA: Diagnosis not present

## 2020-08-16 DIAGNOSIS — M79672 Pain in left foot: Secondary | ICD-10-CM

## 2020-08-16 DIAGNOSIS — N390 Urinary tract infection, site not specified: Secondary | ICD-10-CM

## 2020-08-16 DIAGNOSIS — N951 Menopausal and female climacteric states: Secondary | ICD-10-CM | POA: Diagnosis not present

## 2020-08-16 DIAGNOSIS — Z0000 Encounter for general adult medical examination without abnormal findings: Secondary | ICD-10-CM

## 2020-08-16 DIAGNOSIS — Z124 Encounter for screening for malignant neoplasm of cervix: Secondary | ICD-10-CM

## 2020-08-16 DIAGNOSIS — L723 Sebaceous cyst: Secondary | ICD-10-CM

## 2020-08-16 DIAGNOSIS — K219 Gastro-esophageal reflux disease without esophagitis: Secondary | ICD-10-CM

## 2020-08-16 LAB — CBC WITH DIFFERENTIAL/PLATELET
Basophils Absolute: 0 10*3/uL (ref 0.0–0.1)
Basophils Relative: 0.4 % (ref 0.0–3.0)
Eosinophils Absolute: 0.1 10*3/uL (ref 0.0–0.7)
Eosinophils Relative: 1.3 % (ref 0.0–5.0)
HCT: 41 % (ref 36.0–46.0)
Hemoglobin: 13.6 g/dL (ref 12.0–15.0)
Lymphocytes Relative: 43.3 % (ref 12.0–46.0)
Lymphs Abs: 2.2 10*3/uL (ref 0.7–4.0)
MCHC: 33.3 g/dL (ref 30.0–36.0)
MCV: 86.9 fl (ref 78.0–100.0)
Monocytes Absolute: 0.4 10*3/uL (ref 0.1–1.0)
Monocytes Relative: 7.8 % (ref 3.0–12.0)
Neutro Abs: 2.4 10*3/uL (ref 1.4–7.7)
Neutrophils Relative %: 47.2 % (ref 43.0–77.0)
Platelets: 123 10*3/uL — ABNORMAL LOW (ref 150.0–400.0)
RBC: 4.72 Mil/uL (ref 3.87–5.11)
RDW: 13.5 % (ref 11.5–15.5)
WBC: 5.1 10*3/uL (ref 4.0–10.5)

## 2020-08-16 LAB — COMPREHENSIVE METABOLIC PANEL
ALT: 18 U/L (ref 0–35)
AST: 18 U/L (ref 0–37)
Albumin: 4.4 g/dL (ref 3.5–5.2)
Alkaline Phosphatase: 101 U/L (ref 39–117)
BUN: 14 mg/dL (ref 6–23)
CO2: 32 mEq/L (ref 19–32)
Calcium: 9.9 mg/dL (ref 8.4–10.5)
Chloride: 103 mEq/L (ref 96–112)
Creatinine, Ser: 0.89 mg/dL (ref 0.40–1.20)
GFR: 75.62 mL/min (ref >60.00)
Glucose, Bld: 97 mg/dL (ref 70–99)
Potassium: 3.7 mEq/L (ref 3.5–5.1)
Sodium: 139 mEq/L (ref 135–145)
Total Bilirubin: 0.6 mg/dL (ref 0.2–1.2)
Total Protein: 7.3 g/dL (ref 6.0–8.3)

## 2020-08-16 LAB — LIPID PANEL
Cholesterol: 201 mg/dL — ABNORMAL HIGH (ref 0–200)
HDL: 66.1 mg/dL (ref >39.00)
LDL Cholesterol: 129 mg/dL — ABNORMAL HIGH (ref 0–99)
NonHDL: 134.73
Total CHOL/HDL Ratio: 3
Triglycerides: 31 mg/dL (ref 0.0–149.0)
VLDL: 6.2 mg/dL (ref 0.0–40.0)

## 2020-08-16 LAB — FOLLICLE STIMULATING HORMONE: FSH: 48.3 m[IU]/mL

## 2020-08-16 LAB — HEMOGLOBIN A1C: Hgb A1c MFr Bld: 5.4 % (ref 4.6–6.5)

## 2020-08-16 LAB — TSH: TSH: 0.97 u[IU]/mL (ref 0.35–4.50)

## 2020-08-16 LAB — LUTEINIZING HORMONE: LH: 21.71 m[IU]/mL

## 2020-08-16 MED ORDER — MELOXICAM 15 MG PO TABS: 15.0000 mg | ORAL_TABLET | Freq: Every day | ORAL | 11 refills | Status: DC

## 2020-08-16 MED ORDER — NITROFURANTOIN MONOHYD MACRO 100 MG PO CAPS: ORAL_CAPSULE | ORAL | 4 refills | Status: DC

## 2020-08-16 MED ORDER — PENNSAID 2 % EX SOLN: 1.0000 "application " | Freq: Every day | CUTANEOUS | 0 refills | Status: DC | PRN

## 2020-08-16 MED ORDER — FLUOCINOLONE ACETONIDE 0.01 % OT OIL: TOPICAL_OIL | OTIC | 3 refills | Status: DC

## 2020-08-16 NOTE — Assessment & Plan Note (Signed)
Discussed monitoring for infection and return if worsening.

## 2020-08-16 NOTE — Patient Instructions (Signed)
Return in 4 weeks if heartburn not improving Can also try omeprazole 20 mg daily    Heartburn Heartburn is a type of pain or discomfort that can happen in the throat or chest. It is often described as a burning pain. It may also cause a bad, acid-like taste in the mouth. Heartburn may feel worse when you lie down or bend over. It may be worse at night. It may be caused by stomach contents that move back up (reflux) into the tube that connects the mouth with the stomach (esophagus). Follow these instructions at home: Eating and drinking   Avoid certain foods and drinks as told by your doctor. This may include: ? Coffee and tea (with or without caffeine). ? Drinks that have alcohol. ? Energy drinks and sports drinks. ? Carbonated drinks or sodas. ? Chocolate and cocoa. ? Peppermint and mint flavorings. ? Garlic and onions. ? Horseradish. ? Spicy and acidic foods, such as:  Peppers.  Chili powder and curry powder.  Vinegar.  Hot sauces and BBQ sauce. ? Citrus fruit juices and citrus fruits, such as:  Oranges.  Lemons.  Limes. ? Tomato-based foods, such as:  Red sauce and pizza with red sauce.  Chili.  Salsa. ? Fried and fatty foods, such as:  Donuts.  [place] fries and potato chips.  High-fat dressings. ? High-fat meats, such as:  Hot dogs and sausage.  Rib eye steak.  Ham and bacon. ? High-fat dairy items, such as:  Whole milk.  Butter.  Cream cheese.  Eat small meals often. Avoid eating large meals.  Avoid drinking large amounts of liquid with your meals.  Avoid eating meals during the 2-3 hours before bedtime.  Avoid lying down right after you eat.  Do not exercise right after you eat. Lifestyle      If you are overweight, lose an amount of weight that is healthy for you. Ask your doctor about a safe weight loss goal.  Do not use any products that contain nicotine or tobacco, including cigarettes, e-cigarettes, and chewing tobacco. These  can make your symptoms worse. If you need help quitting, ask your doctor.  Wear loose clothes. Do not wear anything tight around your waist.  Raise (elevate) the head of your bed about 6 inches (15 cm) when you sleep.  Try to lower your stress. If you need help doing this, ask your doctor. General instructions  Pay attention to any changes in your symptoms.  Take over-the-counter and prescription medicines only as told by your doctor. ? Do not take aspirin, ibuprofen, or other NSAIDs unless your doctor says it is okay. ? Stop medicines only as told by your doctor.  Keep all follow-up visits as told by your doctor. This is important. Contact a doctor if:  You have new symptoms.  You lose weight and you do not know why it is happening.  You have trouble swallowing, or it hurts to swallow.  You have wheezing or a cough that keeps happening.  Your symptoms do not get better with treatment.  You have heartburn often for more than 2 weeks. Get help right away if:  You have pain in your arms, neck, jaw, teeth, or back.  You feel sweaty, dizzy, or light-headed.  You have chest pain or shortness of breath.  You throw up (vomit) and your throw up looks like blood or coffee grounds.  Your poop (stool) is bloody or black. These symptoms may represent a serious problem that is an emergency. Do  not wait to see if the symptoms will go away. Get medical help right away. Call your local emergency services (911 in the [place] Do not drive yourself to the hospital. Summary  Heartburn is a type of pain that can happen in the throat or chest. It can feel like a burning pain. It may also cause a bad, acid-like taste in the mouth.  You may need to avoid certain foods and drinks to help your symptoms. Ask your doctor what foods and drinks you should avoid.  Take over-the-counter and prescription medicines only as told by your doctor. Do not take aspirin, ibuprofen, or other NSAIDs unless your  doctor told you to do so.  Contact your doctor if your symptoms do not get better or they get worse. This information is not intended to replace advice given to you by your health care provider. Make sure you discuss any questions you have with your health care provider. Document Revised: 12/29/2017 Document Reviewed: 12/29/2017 Elsevier Patient Education  2020 [employer].

## 2020-08-16 NOTE — Telephone Encounter (Signed)
Left VM for pt, asking for her to callback in reference to how she takes the Macrobid.

## 2020-08-16 NOTE — Telephone Encounter (Signed)
Please check with patient how she is taking macrobid.   Typically would recommend 1 time dose with intercourse.   Will update prescription.

## 2020-08-16 NOTE — Progress Notes (Signed)
Annual Exam   Chief Complaint:  Chief Complaint  Patient presents with  . Annual Exam    Wants A1C and H. Pylori test   . Gynecologic Exam  . Referral    [facility]     History of Present Illness:  Ms. Jennifer Ware is a 50 y.o. [id number] who LMP was No LMP recorded. (Menstrual status: Other)., presents today for her annual examination.     Nutrition She does get adequate calcium and Vitamin D in her diet. Diet: greens, veggies Exercise: 3 times a week, gym, home routine  Safety The patient wears seatbelts: yes.     The patient feels safe at home and in their relationships: yes.   Menstrual:  Symptoms of menopause: hot flashes 1 year since last cycle  GYN She is single partner, contraception - post menopausal status.    Cervical Cancer Screening (21-65):   Last Pap:   December 2018 Results were: no abnormalities /neg HPV DNA   Breast Cancer Screening (Age 39-74):  There is no FH of breast cancer. There is no FH of ovarian cancer. BRCA screening Not Indicated.  Last Mammogram: 06/2020 The patient does want a mammogram this year.    Colon Cancer Screening:  Age 80-75 yo - benefits outweigh the risk. Adults 78-85 yo who have never been screened benefit.  Benefits: 134000 people in 2016 will be diagnosed and 49,000 will die - early detection helps [place]: Complications 2/2 to colonoscopy High Risk (Colonoscopy): genetic disorder (Lynch syndrome or familial adenomatous polyposis), personal hx of IBD, previous adenomatous polyp, or previous colorectal cancer, FamHx start 10 years before the age at diagnosis, increased in males and black race  Options:  FIT - looks for hemoglobin (blood in the stool) - specific and fairly sensitive - must be done annually Cologuard - looks for DNA and blood - more sensitive - therefore can have more false positives, every 3 years Colonoscopy - every 10 years if normal - sedation, bowl prep, must have someone drive you  Shared  decision making and the patient had decided to do colonoscopy.   Social History   Tobacco Use  Smoking Status Never Smoker  Smokeless Tobacco Never Used    Weight Wt Readings from Last 3 Encounters:  08/16/20 156 lb (70.8 kg)  05/09/20 156 lb 12 oz (71.1 kg)  08/16/19 152 lb (68.9 kg)   Patient has high BMI  BMI Readings from Last 1 Encounters:  08/16/20 28.08 kg/m     Chronic disease screening Blood pressure monitoring:  BP Readings from Last 3 Encounters:  08/16/20 100/80  05/09/20 110/80  09/08/19 118/79    Lipid Monitoring: Indication for screening: age >65, obesity, diabetes, family hx, CV risk factors.  Lipid screening: Yes  Lab Results  Component Value Date   CHOL 176 08/16/2019   HDL 62.50 08/16/2019   LDLCALC 106 (H) 08/16/2019   TRIG 34.0 08/16/2019   CHOLHDL 3 08/16/2019     Diabetes Screening: age >48, overweight, family hx, PCOS, hx of gestational diabetes, at risk ethnicity Diabetes Screening screening: Yes  Lab Results  Component Value Date   HGBA1C 5.4 08/16/2019     Past Medical History:  Diagnosis Date  . Abdominal pain 10/29/2011  . Amenorrhea 10/22/2011  . Anxiety   . Chronic UTI 12/24/2011  . Dyspareunia 04/19/2014  . Ear discomfort 07/18/2011  . GERD (gastroesophageal reflux disease)   . HTN (hypertension) 04/25/2014  . Hx gestational diabetes   . Irregular periods/menstrual  cycles 03/04/2014  . Knee crepitus 07/27/2014  . Low back pain 06/07/2011  . Osteoarthritis of both knees 08/07/2016  . Otitis externa 06/07/2011  . Palpitations   . Sciatica of left side 09/01/2017  . Sensation of feeling cold 07/27/2014  . SI (sacroiliac) joint dysfunction 09/01/2017  . Thyroid disease   . Viral URI with cough 09/07/2013    Past Surgical History:  Procedure Laterality Date  . BREAST BIOPSY Right 08/24/2014   negative, u/s core  . CESAREAN SECTION     x3  . HERNIA REPAIR     umbilical  . UMBILICAL HERNIA REPAIR      Prior to  Admission medications   Medication Sig Start Date End Date Taking? Authorizing Provider  AMBULATORY NON FORMULARY MEDICATION Thigh-high, medium compression, graduated compression stockings. Apply to lower extremities. [email or web address], Zippered Compression Stockings, medium circ, long length 08/18/19  Yes Lucille Passy, MD  Ascorbic Acid (VITAMIN C) 1000 MG tablet Take 1,000 mg by mouth daily.   Yes [provider]  BIOTIN 5000 PO Take by mouth daily.   Yes [provider]  Calcium 600-200 MG-UNIT per tablet Take 2 tablets by mouth daily.   Yes [provider]  Cranberry 1000 MG CAPS Take 500 mg by mouth as needed.    Yes [provider]  cyanocobalamin 1000 MCG tablet Take 100 mcg by mouth daily.   Yes [provider]  Diclofenac Sodium (PENNSAID) 2 % SOLN Apply 1 application topically daily as needed.   Yes [provider]  Fluocinolone Acetonide 0.01 % OIL PLACE 2 DROPS TO AFFECTED EAR, NO MORE THAN 1 WEEK AT A TIME 08/16/19  Yes Lucille Passy, MD  Ginkgo Biloba 40 MG TABS Take 1 tablet by mouth daily.   Yes [provider]  glucosamine-chondroitin 500-400 MG tablet Take 1 tablet by mouth 2 (two) times daily.   Yes [provider]  Lecithin 1200 MG CAPS Take 1 capsule by mouth as needed.    Yes [provider]  meloxicam (MOBIC) 15 MG tablet Take 1 tablet (15 mg total) by mouth daily. 08/16/19  Yes Lucille Passy, MD  Multiple Vitamins-Minerals (ANTIOXIDANT PO) Take by mouth daily.   Yes [provider]  nitrofurantoin, macrocrystal-monohydrate, (MACROBID) 100 MG capsule Take as directed for UTI suppression 08/16/19  Yes Lucille Passy, MD    Allergies  Allergen Reactions  . Flagyl [Metronidazole]   . Sulfa Antibiotics     Gynecologic History: No LMP recorded. (Menstrual status: Other).  Obstetric History: [id number]  Social History   Socioeconomic History  . Marital status: Married    Spouse name:  Yaw  . Number of children: 3  . Years of education: BSN  . Highest education level: Not on file  Occupational History  . Occupation: LPN    Employer: [employer]  Tobacco Use  . Smoking status: Never Smoker  . Smokeless tobacco: Never Used  Vaping Use  . Vaping Use: Never used  Substance and Sexual Activity  . Alcohol use: Yes    Comment: once a month   . Drug use: No  . Sexual activity: Yes    Partners: Male    Birth control/protection: Post-menopausal  Other Topics Concern  . Not on file  Social History Narrative   Originally from [place].   Has lived in area for 14 years.   3 children.   Works as a Marine scientist for [employer].        05/09/20  From: [place] - moved to the [place] in 2000   Living: with husband, Yaw (2007) - 2 children at home   Work: [employer] - all the hospital      Family: 3 children - [place], Merrily Pew, Valarie Merino      Enjoys: movies, music, shopping      Exercise: lift weights, walking   Diet: fruit, veggies, country Teacher, music belts: Yes    Guns: No   Safe in relationships: Yes    Social Determinants of Radio broadcast assistant Strain: Not on file  Food Insecurity: Not on file  Transportation Needs: Not on file  Physical Activity: Not on file  Stress: Not on file  Social Connections: Not on file  Intimate Partner Violence: Not on file    Family History  Problem Relation Age of Onset  . Asthma Father   . Colon cancer Neg Hx   . Breast cancer Neg Hx     Review of Systems  Constitutional: Negative for chills and fever.  HENT: Negative for congestion and sore throat.   Eyes: Negative for blurred vision and double vision.  Respiratory: Negative for shortness of breath.   Cardiovascular: Negative for chest pain.  Gastrointestinal: Positive for heartburn. Negative for nausea and vomiting.  Genitourinary: Negative.   Musculoskeletal: Positive for joint pain. Negative for myalgias.  Skin: Negative for rash.  Neurological: Negative for  dizziness and headaches.  Endo/Heme/Allergies: Does not bruise/bleed easily.  Psychiatric/Behavioral: Negative for depression. The patient is not nervous/anxious.      Physical Exam BP 100/80   Pulse 85   Temp (!) 97.3 F (36.3 C) (Temporal)   Ht 5' 2.5" (1.588 m)   Wt 156 lb (70.8 kg)   SpO2 98%   BMI 28.08 kg/m    BP Readings from Last 3 Encounters:  08/16/20 100/80  05/09/20 110/80  09/08/19 118/79      Physical Exam Constitutional:      General: She is not in acute distress.    Appearance: She is well-developed and well-nourished. She is not diaphoretic.  HENT:     Head: Normocephalic and atraumatic.     Right Ear: External ear normal.     Left Ear: External ear normal.     Nose: Nose normal.     Mouth/Throat:     Mouth: Oropharynx is clear and moist.  Eyes:     General: No scleral icterus.    Extraocular Movements: EOM normal.     Conjunctiva/sclera: Conjunctivae normal.  Cardiovascular:     Rate and Rhythm: Normal rate and regular rhythm.     Heart sounds: No murmur heard.   Pulmonary:     Effort: Pulmonary effort is normal. No respiratory distress.     Breath sounds: Normal breath sounds. No wheezing.  Abdominal:     General: Bowel sounds are normal. There is no distension.     Palpations: Abdomen is soft. There is no mass.     Tenderness: There is no abdominal tenderness. There is no guarding or rebound.  Musculoskeletal:        General: No edema. Normal range of motion.     Cervical back: Neck supple.  Lymphadenopathy:     Cervical: No cervical adenopathy.  Skin:    General: Skin is warm and dry.     Capillary Refill: Capillary refill takes less than 2 seconds.     Comments: Small sebaceous cyst under the breast  Neurological:  Mental Status: She is alert and oriented to person, place, and time.     Deep Tendon Reflexes: Strength normal. Reflexes normal.  Psychiatric:        Mood and Affect: Mood and affect normal.        Behavior: Behavior  normal.     Results:  PHQ-9:  [place] Office Visit from 08/16/2019 in [facility]  PHQ-9 Total Score 0        Assessment: 50 y.o. [id number] female here for routine annual physical examination.  Plan: Problem List Items Addressed This Visit      Digestive   Gastroesophageal reflux disease   Relevant Orders   Comprehensive metabolic panel     Musculoskeletal and Integument   Sebaceous cyst    Discussed monitoring for infection and return if worsening.         Other   Bilateral foot pain - Primary    Reports she was previously going and due for follow-up but needs an updated referral.       Relevant Orders   Ambulatory referral to Podiatry    Other Visit Diagnoses    Frequent UTI       Relevant Medications   nitrofurantoin, macrocrystal-monohydrate, (MACROBID) 100 MG capsule   Menopausal symptoms       Relevant Orders   Luteinizing hormone   Follicle stimulating hormone   Annual physical exam       Relevant Orders   Comprehensive metabolic panel   Lipid panel   CBC with Differential   Hemoglobin A1c   TSH   Cervical cancer screening       Relevant Orders   Cytology - PAP      Screening: -- Blood pressure screen normal -- cholesterol screening: will obtain -- Weight screening: overweight: continue to monitor -- Diabetes Screening: will obtain -- Nutrition: Encouraged healthy diet  The 10-year ASCVD risk score Mikey Bussing DC Jr., et al., 2013) is: 0.5%   Values used to calculate the score:     Age: 44 years     Sex: Female     Is Non-Hispanic African American: Yes     Diabetic: No     Tobacco smoker: No     Systolic Blood Pressure: 321 mmHg     Is BP treated: No     HDL Cholesterol: 62.5 mg/dL     Total Cholesterol: 176 mg/dL  -- Statin therapy for Age 71-75 with CVD risk >7.5%  Psych -- Depression screening (PHQ-9):  [place] Visit from 08/16/2019 in [facility]  PHQ-9 Total Score 0        Safety -- tobacco screening: not using -- alcohol screening:  low-risk usage. -- no evidence of domestic violence or intimate partner violence.   Cancer Screening -- pap smear collected per patient request -- family history of breast cancer screening: done. not at high risk. -- Mammogram - up to date -- Colon cancer (age 91+)-- has upcoming appt  Immunizations Immunization History  Administered Date(s) Administered  . Influenza Split 05/27/2014  . Influenza,inj,Quad PF,6+ Mos 04/13/2016, 06/12/2019  . Influenza-Unspecified 05/26/2018, 05/01/2020  . PPD Test 11/25/2011  . Tdap 08/07/2016    -- flu vaccine up to date -- TDAP q10 years up to date -- Covid-19 Vaccine unknown, records   Encouraged healthy diet and exercise. Encouraged regular vision and dental care.    Lesleigh Noe, MD

## 2020-08-16 NOTE — Assessment & Plan Note (Signed)
Reports she was previously going and due for follow-up but needs an updated referral.

## 2020-08-16 NOTE — Telephone Encounter (Signed)
Jennifer Ware at [facility] left a voicemail stating that they received a prescription for Macrobid today to  take as directed. Jennifer Ware stated that they can not process a prescription without specific directions. Jennifer Ware requested a call back with directions on medication.

## 2020-08-17 ENCOUNTER — Encounter: Payer: No Typology Code available for payment source | Admitting: Physical Therapy

## 2020-08-17 NOTE — Telephone Encounter (Signed)
Pt called in returning call , asked how often she takes the medication she stated as needed

## 2020-08-18 LAB — CYTOLOGY - PAP
Comment: NEGATIVE
Diagnosis: NEGATIVE
High risk HPV: NEGATIVE

## 2020-08-21 ENCOUNTER — Other Ambulatory Visit: Payer: Self-pay | Admitting: Gastroenterology

## 2020-08-21 ENCOUNTER — Ambulatory Visit (AMBULATORY_SURGERY_CENTER): Payer: Self-pay | Admitting: *Deleted

## 2020-08-21 ENCOUNTER — Encounter: Payer: No Typology Code available for payment source | Admitting: Physical Therapy

## 2020-08-21 ENCOUNTER — Other Ambulatory Visit: Payer: Self-pay

## 2020-08-21 VITALS — Ht 62.0 in | Wt 161.0 lb

## 2020-08-21 DIAGNOSIS — Z1211 Encounter for screening for malignant neoplasm of colon: Secondary | ICD-10-CM

## 2020-08-21 MED ORDER — SUPREP BOWEL PREP KIT 17.5-3.13-1.6 GM/177ML PO SOLN: 1.0000 | Freq: Once | ORAL | 0 refills | Status: DC

## 2020-08-21 NOTE — Progress Notes (Signed)
No egg or soy allergy known to patient  No issues with past sedation with any surgeries or procedures No intubation problems in the past  No FH of Malignant Hyperthermia No diet pills per patient No home 02 use per patient  No blood thinners per patient  Pt denies issues with constipation  No A fib or A flutter  EMMI video to pt or via [place] 19 guidelines implemented in PV today with Pt and RN  Pt is fully vaccinated  for Covid   Due to the COVID-19 pandemic we are asking patients to follow certain guidelines.  Pt aware of COVID protocols and [facility] guidelines   Suprep Coupon to Pt in Pv today

## 2020-08-23 ENCOUNTER — Encounter: Payer: No Typology Code available for payment source | Admitting: Physical Therapy

## 2020-08-30 ENCOUNTER — Other Ambulatory Visit: Payer: Self-pay

## 2020-08-30 ENCOUNTER — Other Ambulatory Visit: Payer: Self-pay | Admitting: Podiatry

## 2020-08-30 ENCOUNTER — Ambulatory Visit (INDEPENDENT_AMBULATORY_CARE_PROVIDER_SITE_OTHER): Payer: No Typology Code available for payment source

## 2020-08-30 ENCOUNTER — Encounter: Payer: Self-pay | Admitting: Podiatry

## 2020-08-30 ENCOUNTER — Ambulatory Visit (INDEPENDENT_AMBULATORY_CARE_PROVIDER_SITE_OTHER): Payer: No Typology Code available for payment source | Admitting: Podiatry

## 2020-08-30 DIAGNOSIS — M2141 Flat foot [pes planus] (acquired), right foot: Secondary | ICD-10-CM

## 2020-08-30 DIAGNOSIS — M7661 Achilles tendinitis, right leg: Secondary | ICD-10-CM | POA: Diagnosis not present

## 2020-08-30 DIAGNOSIS — M7671 Peroneal tendinitis, right leg: Secondary | ICD-10-CM

## 2020-08-30 DIAGNOSIS — M7672 Peroneal tendinitis, left leg: Secondary | ICD-10-CM

## 2020-08-30 DIAGNOSIS — M2142 Flat foot [pes planus] (acquired), left foot: Secondary | ICD-10-CM

## 2020-08-30 DIAGNOSIS — M7662 Achilles tendinitis, left leg: Secondary | ICD-10-CM

## 2020-08-30 DIAGNOSIS — M722 Plantar fascial fibromatosis: Secondary | ICD-10-CM

## 2020-08-30 MED ORDER — MELOXICAM 15 MG PO TABS: 15.0000 mg | ORAL_TABLET | Freq: Every day | ORAL | 0 refills | Status: DC

## 2020-08-30 NOTE — Patient Instructions (Signed)
Look for Voltaren gel at the pharmacy over the counter or online (also known as diclofenac 1% gel). Apply to the painful areas 3-4x daily with the supplied dosing card. Allow to dry for 10 minutes before going into socks/shoes   Achilles Tendinitis  with Rehab Achilles tendinitis is a disorder of the Achilles tendon. The Achilles tendon connects the large calf muscles (Gastrocnemius and Soleus) to the heel bone (calcaneus). This tendon is sometimes called the heel cord. It is important for pushing-off and standing on your toes and is important for walking, running, or jumping. Tendinitis is often caused by overuse and repetitive microtrauma. SYMPTOMS Pain, tenderness, swelling, warmth, and redness may occur over the Achilles tendon even at rest. Pain with pushing off, or flexing or extending the ankle. Pain that is worsened after or during activity. CAUSES  Overuse sometimes seen with rapid increase in exercise programs or in sports requiring running and jumping. Poor physical conditioning (strength and flexibility or endurance). Running sports, especially training running down hills. Inadequate warm-up before practice or play or failure to stretch before participation. Injury to the tendon. PREVENTION  Warm up and stretch before practice or competition. Allow time for adequate rest and recovery between practices and competition. Keep up conditioning. Keep up ankle and leg flexibility. Improve or keep muscle strength and endurance. Improve cardiovascular fitness. Use proper technique. Use proper equipment (shoes, skates). To help prevent recurrence, taping, protective strapping, or an adhesive bandage may be recommended for several weeks after healing is complete. PROGNOSIS  Recovery may take weeks to several months to heal. Longer recovery is expected if symptoms have been prolonged. Recovery is usually quicker if the inflammation is due to a direct blow as compared with overuse or  sudden strain. RELATED COMPLICATIONS  Healing time will be prolonged if the condition is not correctly treated. The injury must be given plenty of time to heal. Symptoms can reoccur if activity is resumed too soon. Untreated, tendinitis may increase the risk of tendon rupture requiring additional time for recovery and possibly surgery. TREATMENT  The first treatment consists of rest anti-inflammatory medication, and ice to relieve the pain. Stretching and strengthening exercises after resolution of pain will likely help reduce the risk of recurrence. Referral to a physical therapist or athletic trainer for further evaluation and treatment may be helpful. A walking boot or cast may be recommended to rest the Achilles tendon. This can help break the cycle of inflammation and microtrauma. Arch supports (orthotics) may be prescribed or recommended by your caregiver as an adjunct to therapy and rest. Surgery to remove the inflamed tendon lining or degenerated tendon tissue is rarely necessary and has shown less than predictable results. MEDICATION  Nonsteroidal anti-inflammatory medications, such as aspirin and ibuprofen, may be used for pain and inflammation relief. Do not take within 7 days before surgery. Take these as directed by your caregiver. Contact your caregiver immediately if any bleeding, stomach upset, or signs of allergic reaction occur. Other minor pain relievers, such as acetaminophen, may also be used. Pain relievers may be prescribed as necessary by your caregiver. Do not take prescription pain medication for longer than 4 to 7 days. Use only as directed and only as much as you need. Cortisone injections are rarely indicated. Cortisone injections may weaken tendons and predispose to rupture. It is better to give the condition more time to heal than to use them. HEAT AND COLD Cold is used to relieve pain and reduce inflammation for acute and chronic Achilles   tendinitis. Cold should be  applied for 10 to 15 minutes every 2 to 3 hours for inflammation and pain and immediately after any activity that aggravates your symptoms. Use ice packs or an ice massage. Heat may be used before performing stretching and strengthening activities prescribed by your caregiver. Use a heat pack or a warm soak. SEEK MEDICAL CARE IF: Symptoms get worse or do not improve in 2 weeks despite treatment. New, unexplained symptoms develop. Drugs used in treatment may produce side effects.  EXERCISES:  RANGE OF MOTION (ROM) AND STRETCHING EXERCISES - Achilles Tendinitis  These exercises may help you when beginning to rehabilitate your injury. Your symptoms may resolve with or without further involvement from your physician, physical therapist or athletic trainer. While completing these exercises, remember:  Restoring tissue flexibility helps normal motion to return to the joints. This allows healthier, less painful movement and activity. An effective stretch should be held for at least 30 seconds. A stretch should never be painful. You should only feel a gentle lengthening or release in the stretched tissue.  STRETCH  Gastroc, Standing  Place hands on wall. Extend right / left leg, keeping the front knee somewhat bent. Slightly point your toes inward on your back foot. Keeping your right / left heel on the floor and your knee straight, shift your weight toward the wall, not allowing your back to arch. You should feel a gentle stretch in the right / left calf. Hold this position for 10 seconds. Repeat 3 times. Complete this stretch 2 times per day.  STRETCH  Soleus, Standing  Place hands on wall. Extend right / left leg, keeping the other knee somewhat bent. Slightly point your toes inward on your back foot. Keep your right / left heel on the floor, bend your back knee, and slightly shift your weight over the back leg so that you feel a gentle stretch deep in your back calf. Hold this position for 10  seconds. Repeat 3 times. Complete this stretch 2 times per day.  STRETCH  Gastrocsoleus, Standing  Note: This exercise can place a lot of stress on your foot and ankle. Please complete this exercise only if specifically instructed by your caregiver.  Place the ball of your right / left foot on a step, keeping your other foot firmly on the same step. Hold on to the wall or a rail for balance. Slowly lift your other foot, allowing your body weight to press your heel down over the edge of the step. You should feel a stretch in your right / left calf. Hold this position for 10 seconds. Repeat this exercise with a slight bend in your knee. Repeat 3 times. Complete this stretch 2 times per day.   STRENGTHENING EXERCISES - Achilles Tendinitis These exercises may help you when beginning to rehabilitate your injury. They may resolve your symptoms with or without further involvement from your physician, physical therapist or athletic trainer. While completing these exercises, remember:  Muscles can gain both the endurance and the strength needed for everyday activities through controlled exercises. Complete these exercises as instructed by your physician, physical therapist or athletic trainer. Progress the resistance and repetitions only as guided. You may experience muscle soreness or fatigue, but the pain or discomfort you are trying to eliminate should never worsen during these exercises. If this pain does worsen, stop and make certain you are following the directions exactly. If the pain is still present after adjustments, discontinue the exercise until you can discuss the   as guided.  You may experience muscle soreness or fatigue, but the pain or discomfort you are trying to eliminate should never worsen during these exercises. If this pain does worsen, stop and make certain you are following the directions exactly. If the pain is still present after adjustments, discontinue the exercise until you can discuss the trouble with your clinician.  STRENGTH - Plantar-flexors   Sit with your right / left leg extended. Holding onto both ends of a rubber exercise band/tubing, loop it around the ball of your foot. Keep a slight  tension in the band.  Slowly push your toes away from you, pointing them downward.  Hold this position for 10 seconds. Return slowly, controlling the tension in the band/tubing. Repeat 3 times. Complete this exercise 2 times per day.   STRENGTH - Plantar-flexors   Stand with your feet shoulder width apart. Steady yourself with a wall or table using as little support as needed.  Keeping your weight evenly spread over the width of your feet, rise up on your toes.*  Hold this position for 10 seconds. Repeat 3 times. Complete this exercise 2 times per day.  *If this is too easy, shift your weight toward your right / left leg until you feel challenged. Ultimately, you may be asked to do this exercise with your right / left foot only.  STRENGTH  Plantar-flexors, Eccentric  Note: This exercise can place a lot of stress on your foot and ankle. Please complete this exercise only if specifically instructed by your caregiver.   Place the balls of your feet on a step. With your hands, use only enough support from a wall or rail to keep your balance.  Keep your knees straight and rise up on your toes.  Slowly shift your weight entirely to your right / left toes and pick up your opposite foot. Gently and with controlled movement, lower your weight through your right / left foot so that your heel drops below the level of the step. You will feel a slight stretch in the back of your calf at the end position.  Use the healthy leg to help rise up onto the balls of both feet, then lower weight only on the right / left leg again. Build up to 15 repetitions. Then progress to 3 consecutive sets of 15 repetitions.*  After completing the above exercise, complete the same exercise with a slight knee bend (about 30 degrees). Again, build up to 15 repetitions. Then progress to 3 consecutive sets of 15 repetitions.* Perform this exercise 2 times per day.  *When you easily complete 3 sets of 15, your physician,  physical therapist or athletic trainer may advise you to add resistance by wearing a backpack filled with additional weight.  STRENGTH - Plantar Flexors, Seated   Sit on a chair that allows your feet to rest flat on the ground. If necessary, sit at the edge of the chair.  Keeping your toes firmly on the ground, lift your right / left heel as far as you can without increasing any discomfort in your ankle. Repeat 3 times. Complete this exercise 2 times a day.    Peroneal Tendinopathy Rehab Ask your health care provider which exercises are safe for you. Do exercises exactly as told by your health care provider and adjust them as directed. It is normal to feel mild stretching, pulling, tightness, or discomfort as you do these exercises. Stop right away if you feel sudden pain or your pain gets worse. Do  not begin these exercises until told by your health care provider. Stretching and range-of-motion exercises These exercises warm up your muscles and joints and improve the movement and flexibility of your ankle. These exercises also help to relieve pain and stiffness. Gastroc and soleus stretch, standing  This is an exercise in which you stand on a step and use your body weight to stretch your calf muscles. To do this exercise: 1. Stand on the edge of a step on the ball of your left / right foot. The ball of your foot is on the walking surface, right under your toes. 2. Keep your other foot firmly on the same step. 3. Hold on to the wall, a railing, or a chair for balance. 4. Slowly lift your other foot, allowing your body weight to press your left / right heel down over the edge of the step. You should feel a stretch in your left / right calf (gastrocnemius and soleus). 5. Hold this position for 15 seconds. 6. Return both feet to the step. 7. Repeat this exercise with a slight bend in your left / right knee. Repeat 5 times with your left / right knee straight and 5 times with your left / right  knee bent. Complete this exercise 2 times a day. Strengthening exercises These exercises build strength and endurance in your foot and ankle. Endurance is the ability to use your muscles for a long time, even after they get tired. Ankle dorsiflexion with band   1. Secure a rubber exercise band or tube to an object, such as a table leg, that will not move when the band is pulled. 2. Secure the other end of the band around your left / right foot. 3. Sit on the floor, facing the object with your left / right leg extended. The band or tube should be slightly tense when your foot is relaxed. 4. Slowly flex your left / right ankle and toes to bring your foot toward you (dorsiflexion). 5. Hold this position for 15 seconds. 6. Let the band or tube slowly pull your foot back to the starting position. Repeat 5 times. Complete this exercise 2 times a day. Ankle eversion 1. Sit on the floor with your legs straight out in front of you. 2. Loop a rubber exercise band or tube around the ball of your left / right foot. The ball of your foot is on the walking surface, right under your toes. 3. Hold the ends of the band in your hands, or secure the band to a stable object. The band or tube should be slightly tense when your foot is relaxed. 4. Slowly push your foot outward, away from your other leg (eversion). 5. Hold this position for 15 seconds. 6. Slowly return your foot to the starting position. Repeat 5 times. Complete this exercise 2 times a day. Plantar flexion, standing  This exercise is sometimes called standing heel raise. 1. Stand with your feet shoulder-width apart. 2. Place your hands on a wall or table to steady yourself as needed, but try not to use it for support. 3. Keep your weight spread evenly over the width of your feet while you slowly rise up on your toes (plantar flexion). If told by your health care provider: ? Shift your weight toward your left / right leg until you feel  challenged. ? Stand on your left / right leg only. 4. Hold this position for 15 seconds. Repeat 2 times. Complete this exercise 2 times a day. Single  leg stand 1. Without shoes, stand near a railing or in a doorway. You may hold on to the railing or door frame as needed. 2. Stand on your left / right foot. Keep your big toe down on the floor and try to keep your arch lifted. ? Do not roll to the outside of your foot. ? If this exercise is too easy, you can try it with your eyes closed or while standing on a pillow. 3. Hold this position for 15 seconds. Repeat 5 times. Complete this exercise 2 times a day. This information is not intended to replace advice given to you by your health care provider. Make sure you discuss any questions you have with your health care provider. Document Revised: 11/17/2018 Document Reviewed: 11/17/2018 Elsevier Patient Education  [place].

## 2020-08-30 NOTE — Progress Notes (Signed)
  Subjective:  Patient ID: Jennifer Ware, female    DOB: 29-Jan-1971,  MRN: [id number]  Chief Complaint  Patient presents with  . Foot Pain    Bilateral foot pain again, hurts all over and is worse after she works her shift.  She describes it as achy    50 y.o. female presents with the above complaint. History confirmed with patient.  Previously had orthotics made and she saw Dr. Paulla Dolly.  She works as a Marine scientist at [employer].  Objective:  Physical Exam: warm, good capillary refill, no trophic changes or ulcerative lesions, normal DP and PT pulses and normal sensory exam.  And palpation to the insertion Achilles tendon on the posterior calcaneus.  Has mild pain along the peroneal tendons and with eversion.  Mild pain over the sinus tarsi.  No subfibular pain no medial arch pain.  X-rays of both feet: Posterior and plantar calcaneal enthesophytes, mild pes planus, left foot there is a large dorsal spur over the talar neck  Assessment:   1. Achilles tendinitis of both lower extremities   2. Peroneal tendinitis of both lower legs   3. Pes planus of both feet      Plan:  Patient was evaluated and treated and all questions answered.   Discussed the etiology and treatment options for peroneal tendinitis and Achilles tendinitis including stretching, formal physical therapy, supportive shoegears such as a running shoe or sneaker, pre fabricated orthoses, and oral medications. We also discussed the role of surgical treatment of this for patients who do not improve after exhausting non-surgical treatment options.    -XR reviewed with patient -Educated on stretching and icing of the affected limb. -Rx for Meloxicam. Advised on risks, benefits, and alternatives of the medication -Consider physical therapy at next visit if not improving -Recommended Voltaren gel  Return in about 6 weeks (around 10/11/2020) for re-check Achilles tendon, re-check peroneal tendinitis.

## 2020-09-04 ENCOUNTER — Encounter: Payer: Self-pay | Admitting: Gastroenterology

## 2020-09-04 ENCOUNTER — Other Ambulatory Visit: Payer: Self-pay

## 2020-09-04 ENCOUNTER — Ambulatory Visit (AMBULATORY_SURGERY_CENTER): Payer: No Typology Code available for payment source | Admitting: Gastroenterology

## 2020-09-04 VITALS — BP 113/78 | HR 75 | Temp 97.1°F | Resp 12 | Ht 62.0 in | Wt 161.0 lb

## 2020-09-04 DIAGNOSIS — Z1211 Encounter for screening for malignant neoplasm of colon: Secondary | ICD-10-CM

## 2020-09-04 MED ORDER — SODIUM CHLORIDE 0.9 % IV SOLN
500.0000 mL | Freq: Once | INTRAVENOUS | Status: DC
Start: 2020-09-04 — End: 2022-06-03

## 2020-09-04 NOTE — Op Note (Signed)
[facility] Patient Name: Jennifer Ware Procedure Date: 09/04/2020 8:22 AM MRN: [id number] Endoscopist: Milus Banister , MD Age: 50 Referring MD:  Date of Birth: [birth year] Gender: Female Account #: [id number] Procedure:                Colonoscopy Indications:              Screening for colorectal malignant neoplasm Medicines:                Monitored Anesthesia Care Procedure:                Pre-Anesthesia Assessment:                           - Prior to the procedure, a History and Physical                            was performed, and patient medications and                            allergies were reviewed. The patient's tolerance of                            previous anesthesia was also reviewed. The risks                            and benefits of the procedure and the sedation                            options and risks were discussed with the patient.                            All questions were answered, and informed consent                            was obtained. Prior Anticoagulants: The patient has                            taken no previous anticoagulant or antiplatelet                            agents. ASA Grade Assessment: II - A patient with                            mild systemic disease. After reviewing the risks                            and benefits, the patient was deemed in                            satisfactory condition to undergo the procedure.                           After obtaining informed consent, the colonoscope  was passed under direct vision. Throughout the                            procedure, the patient's blood pressure, pulse, and                            oxygen saturations were monitored continuously. The                            Olympus PCF-H190DL (#[id number]) Colonoscope was                            introduced through the anus and advanced to the the                            cecum,  identified by appendiceal orifice and                            ileocecal valve. The colonoscopy was performed                            without difficulty. The patient tolerated the                            procedure well. The quality of the bowel                            preparation was good. The ileocecal valve,                            appendiceal orifice, and rectum were photographed. Scope In: 8:27:12 AM Scope Out: 8:40:41 AM Scope Withdrawal Time: 0 hours 10 minutes 24 seconds  Total Procedure Duration: 0 hours 13 minutes 29 seconds  Findings:                 An area of melanosis was found in the entire colon.                           The exam was otherwise without abnormality on                            direct and retroflexion views. Complications:            No immediate complications. Estimated blood loss:                            None. Estimated Blood Loss:     Estimated blood loss: none. Impression:               - Melanosis in the colon.                           - The examination was otherwise normal on direct                            and  retroflexion views.                           - No polyps or cancers. Recommendation:           - Patient has a contact number available for                            emergencies. The signs and symptoms of potential                            delayed complications were discussed with the                            patient. Return to normal activities tomorrow.                            Written discharge instructions were provided to the                            patient.                           - Resume previous diet.                           - Continue present medications.                           - Repeat colonoscopy in 10 years for screening. Milus Banister, MD 09/04/2020 8:43:19 AM This report has been signed electronically.

## 2020-09-04 NOTE — Progress Notes (Signed)
Medical history reviewed with no changes since PV. VS assessed by J.K

## 2020-09-04 NOTE — Patient Instructions (Signed)
Repeat colonoscopy in 10 years for screening. ° °YOU HAD AN ENDOSCOPIC PROCEDURE TODAY AT THE [facility]:   Refer to the procedure report that was given to you for any specific questions about what was found during the examination.  If the procedure report does not answer your questions, please call your gastroenterologist to clarify.  If you requested that your care partner not be given the details of your procedure findings, then the procedure report has been included in a sealed envelope for you to review at your convenience later. ° °YOU SHOULD EXPECT: Some feelings of bloating in the abdomen. Passage of more gas than usual.  Walking can help get rid of the air that was put into your GI tract during the procedure and reduce the bloating. If you had a lower endoscopy (such as a colonoscopy or flexible sigmoidoscopy) you may notice spotting of blood in your stool or on the toilet paper. If you underwent a bowel prep for your procedure, you may not have a normal bowel movement for a few days. ° °Please Note:  You might notice some irritation and congestion in your nose or some drainage.  This is from the oxygen used during your procedure.  There is no need for concern and it should clear up in a day or so. ° °SYMPTOMS TO REPORT IMMEDIATELY: ° °Following lower endoscopy (colonoscopy or flexible sigmoidoscopy): ° Excessive amounts of blood in the stool ° Significant tenderness or worsening of abdominal pains ° Swelling of the abdomen that is new, acute ° Fever of 100°F or higher ° °For urgent or emergent issues, a gastroenterologist can be reached at any hour by calling [phone]. °Do not use MyChart messaging for urgent concerns.  ° ° °DIET:  We do recommend a small meal at first, but then you may proceed to your regular diet.  Drink plenty of fluids but you should avoid alcoholic beverages for 24 hours. ° °ACTIVITY:  You should plan to take it easy for the rest of today and you should NOT DRIVE  or use heavy machinery until tomorrow (because of the sedation medicines used during the test).   ° °FOLLOW UP: °Our staff will call the number listed on your records 48-72 hours following your procedure to check on you and address any questions or concerns that you may have regarding the information given to you following your procedure. If we do not reach you, we will leave a message.  We will attempt to reach you two times.  During this call, we will ask if you have developed any symptoms of COVID 19. If you develop any symptoms (ie: fever, flu-like symptoms, shortness of breath, cough etc.) before then, please call [phone].  If you test positive for Covid 19 in the 2 weeks post procedure, please call and report this information to us.   ° °If any biopsies were taken you will be contacted by phone or by letter within the next 1-3 weeks.  Please call us at [phone] if you have not heard about the biopsies in 3 weeks.  ° ° °SIGNATURES/CONFIDENTIALITY: °You and/or your care partner have signed paperwork which will be entered into your electronic medical record.  These signatures attest to the fact that that the information above on your After Visit Summary has been reviewed and is understood.  Full responsibility of the confidentiality of this discharge information lies with you and/or your care-partner. ° °

## 2020-09-04 NOTE — Progress Notes (Signed)
A and O x3. Report to RN. Tolerated MAC anesthesia well.

## 2020-09-06 ENCOUNTER — Telehealth: Payer: Self-pay | Admitting: *Deleted

## 2020-09-06 NOTE — Telephone Encounter (Signed)
  Follow up Call-  Call back number 09/04/2020  Post procedure Call Back phone  # [phone]  Permission to leave phone message Yes  Some recent data might be hidden     Patient questions:  Do you have a fever, pain , or abdominal swelling? No. Pain Score  0 *  Have you tolerated food without any problems? Yes.    Have you been able to return to your normal activities? Yes.    Do you have any questions about your discharge instructions: Diet   No. Medications  No. Follow up visit  No.  Do you have questions or concerns about your Care? No.  Actions: * If pain score is 4 or above: No action needed, pain <4.  1. Have you developed a fever since your procedure? no  2.   Have you had an respiratory symptoms (SOB or cough) since your procedure? no  3.   Have you tested positive for COVID 19 since your procedure no  4.   Have you had any family members/close contacts diagnosed with the COVID 19 since your procedure? no   If yes to any of these questions please route to Joylene John, RN and Joella Prince, RN

## 2020-09-13 ENCOUNTER — Telehealth: Payer: Self-pay | Admitting: Family Medicine

## 2020-09-13 ENCOUNTER — Ambulatory Visit (INDEPENDENT_AMBULATORY_CARE_PROVIDER_SITE_OTHER): Payer: No Typology Code available for payment source | Admitting: Orthotics

## 2020-09-13 ENCOUNTER — Other Ambulatory Visit: Payer: Self-pay

## 2020-09-13 DIAGNOSIS — M7661 Achilles tendinitis, right leg: Secondary | ICD-10-CM | POA: Diagnosis not present

## 2020-09-13 DIAGNOSIS — M7671 Peroneal tendinitis, right leg: Secondary | ICD-10-CM | POA: Diagnosis not present

## 2020-09-13 DIAGNOSIS — M7672 Peroneal tendinitis, left leg: Secondary | ICD-10-CM

## 2020-09-13 DIAGNOSIS — M7662 Achilles tendinitis, left leg: Secondary | ICD-10-CM

## 2020-09-13 NOTE — Telephone Encounter (Signed)
Patient is calling about an ointment she is taken. She states that she can't afford the out of pocket expense. Patient is wanting to discuss this with you.  Medication Diclofenac Sodium  Wants the prescription sent to [facility], [place]  * Had a hard time understanding what it is she is really needing. Please call to clarify. According to her it was not ordered by Dr. Einar Pheasant originally. *

## 2020-09-13 NOTE — Progress Notes (Signed)
Repeat previous order.  Ship to [facility] and patient will p/u from front.  No appointment necessary

## 2020-09-14 NOTE — Telephone Encounter (Signed)
Please clarify medication.   I have previously prescribed Diclofenac Sodium, however, if she is currently taking meloxicam would not recommend using both at the same time.   Ok to send in refill to pharmacy of Diclofenac as long as she does not take with meloxicam

## 2020-09-19 ENCOUNTER — Ambulatory Visit (INDEPENDENT_AMBULATORY_CARE_PROVIDER_SITE_OTHER): Payer: No Typology Code available for payment source

## 2020-09-19 ENCOUNTER — Other Ambulatory Visit: Payer: Self-pay

## 2020-09-19 ENCOUNTER — Telehealth: Payer: Self-pay

## 2020-09-19 DIAGNOSIS — Z111 Encounter for screening for respiratory tuberculosis: Secondary | ICD-10-CM | POA: Diagnosis not present

## 2020-09-19 NOTE — Progress Notes (Signed)
PPD Placement note Jennifer Ware, 50 y.o. female is here today for placement of PPD test Reason for PPD test: Work Pt taken PPD test before: yes Verified in allergy area and with patient that they are not allergic to the products PPD is made of (Phenol or Tween). Yes Is patient taking any oral or IV steroid medication now or have they taken it in the last month? No Has the patient ever received the BCG vaccine?: no Has the patient been in recent contact with anyone known or suspected of having active TB disease?: no      Date of exposure (if applicable): N/A      Name of person they were exposed to (if applicable): N/A Patient's Country of origin: Unknown  O: Alert and oriented in NAD. P:  PPD placed on 09/19/2020.  Patient advised to return for reading within 48-72 hours.

## 2020-09-19 NOTE — Telephone Encounter (Signed)
Patient dropped off health screening form off and would like a call back when ready for pick up   Please advise

## 2020-09-20 ENCOUNTER — Ambulatory Visit (INDEPENDENT_AMBULATORY_CARE_PROVIDER_SITE_OTHER): Payer: No Typology Code available for payment source | Admitting: Dermatology

## 2020-09-20 ENCOUNTER — Other Ambulatory Visit: Payer: Self-pay | Admitting: Dermatology

## 2020-09-20 ENCOUNTER — Other Ambulatory Visit: Payer: Self-pay

## 2020-09-20 DIAGNOSIS — L700 Acne vulgaris: Secondary | ICD-10-CM | POA: Diagnosis not present

## 2020-09-20 DIAGNOSIS — L720 Epidermal cyst: Secondary | ICD-10-CM

## 2020-09-20 MED ORDER — PENNSAID 2 % EX SOLN: 1.0000 "application " | Freq: Every day | CUTANEOUS | 0 refills | Status: DC | PRN

## 2020-09-20 MED ORDER — CLINDAMYCIN PHOSPHATE 1 % EX SOLN: Freq: Every day | CUTANEOUS | 2 refills | Status: DC

## 2020-09-20 NOTE — Telephone Encounter (Signed)
Spoke with pt and got clarification. Pt states that she is not taking meloxicam any longer. She has requested that the [place]aid 2% gel get sent into [facility] in [place], where they will apply a coupon to the price and then pt will pay remainder of balance out of pocket.  I sent gel to pharmacy as requested.

## 2020-09-20 NOTE — Telephone Encounter (Signed)
Spoke to pt and notified her that form is ready for pick up.

## 2020-09-20 NOTE — Patient Instructions (Addendum)
   Pre-Operative Instructions  You are scheduled for a surgical procedure at [facility]. We recommend you read the following instructions. If you have any questions or concerns, please call the office at [phone].  1. Shower and wash the entire body with soap and water the day of your surgery paying special attention to cleansing at and around the planned surgery site.  2. Avoid aspirin or aspirin containing products at least fourteen (14) days prior to your surgical procedure and for at least one week (7 Days) after your surgical procedure. If you take aspirin on a regular basis for heart disease or history of stroke or for any other reason, we may recommend you continue taking aspirin but please notify us if you take this on a regular basis. Aspirin can cause more bleeding to occur during surgery as well as prolonged bleeding and bruising after surgery.   3. Avoid other nonsteroidal pain medications at least one week prior to surgery and at least one week prior to your surgery. These include medications such as Ibuprofen (Motrin, Advil and Nuprin), Naprosyn, Voltaren, Relafen, etc. If medications are used for therapeutic reasons, please inform us as they can cause increased bleeding or prolonged bleeding during and bruising after surgical procedures.   4. Please advise [place] if you are taking any "blood thinner" medications such as Coumadin or Dipyridamole or Plavix or similar medications. These cause increased bleeding and prolonged bleeding during procedures and bruising after surgical procedures. We may have to consider discontinuing these medications briefly prior to and shortly after your surgery if safe to do so.   5. Please inform us of all medications you are currently taking. All medications that are taken regularly should be taken the day of surgery as you always do. Nevertheless, we need to be informed of what medications you are taking prior to surgery to know whether they will  affect the procedure or cause any complications.   6. Please inform us of any medication allergies. Also inform us of whether you have allergies to Latex or rubber products or whether you have had any adverse reaction to Lidocaine or Epinephrine.  7. Please inform us of any prosthetic or artificial body parts such as artificial heart valve, joint replacements, etc., or similar condition that might require preoperative antibiotics.   8. We recommend avoidance of alcohol at least two weeks prior to surgery and continued avoidance for at least two weeks after surgery.   9. We recommend discontinuation of tobacco smoking at least two weeks prior to surgery and continued abstinence for at least two weeks after surgery.  10. Do not plan strenuous exercise, strenuous work or strenuous lifting for approximately four weeks after your surgery.   11. We request if you are unable to make your scheduled surgical appointment, please call us at least a week in advance or as soon as you are aware of a problem so that we can cancel or reschedule the appointment.   12. You MAY TAKE TYLENOL (acetaminophen) for pain as it is not a blood thinner.   13. PLEASE PLAN TO BE IN TOWN FOR TWO WEEKS FOLLOWING SURGERY, THIS IS IMPORTANT SO YOU CAN BE CHECKED FOR DRESSING CHANGES, SUTURE REMOVAL AND TO MONITOR FOR POSSIBLE COMPLICATIONS.    Start clindamycin solution in the am. Pair with Cln acne wash or [facility] hypochlorous spray to prevent antibiotic resistance.

## 2020-09-20 NOTE — Progress Notes (Signed)
   New Patient Visit  Subjective  Jennifer Ware is a 50 y.o. female who presents for the following: Skin Problem (Patient here today for spot at back. Present for years, gets inflamed and she will squeeze it. Serous fluid comes out and there is an odor. ).  Patient also concerned about acne at the face, present for years.   The following portions of the chart were reviewed this encounter and updated as appropriate:   Tobacco  Allergies  Meds  Problems  Med Hx  Surg Hx  Fam Hx      Review of Systems:  No other skin or systemic complaints except as noted in HPI or Assessment and Plan.  Objective  Well appearing patient in no apparent distress; mood and affect are within normal limits.  A focused examination was performed including face, neck, chest and back. Relevant physical exam findings are noted in the Assessment and Plan.  Objective  Low back left of midline: 0.8cm subcutaneous papule with overlying punctum  Objective  Face: Trace open comedones and few small inflammatory papules at the face Chest and back clear   Assessment & Plan  Epidermal inclusion cyst Low back left of midline  Benign-appearing. Exam most consistent with an epidermal inclusion cyst. Discussed that a cyst is a benign growth that can grow over time and sometimes get irritated or inflamed. Recommend observation if it is not bothersome. Discussed option of surgical excision to remove it if it is growing, symptomatic, or other changes noted. Please call for new or changing lesions so they can be evaluated.  She prefers excision, will schedule.  Acne vulgaris Face  Chronic condition with duration over one year. Condition is bothersome to patient. Currently flared.  Start clindamycin solution in the am. Pair with Cln acne wash or [facility] hypochlorous spray to prevent antibiotic resistance.    Ordered Medications: clindamycin (CLEOCIN T) 1 % external solution  Return for Surgery, 2-3 months  for acne follow up.  Graciella Belton, RMA, am acting as scribe for Forest Gleason, MD .  Documentation: I have reviewed the above documentation for accuracy and completeness, and I agree with the above.  Forest Gleason, MD

## 2020-09-21 LAB — TB SKIN TEST
Induration: 0 mm
TB Skin Test: NEGATIVE

## 2020-09-25 ENCOUNTER — Encounter: Payer: Self-pay | Admitting: Dermatology

## 2020-09-29 ENCOUNTER — Telehealth: Payer: Self-pay

## 2020-09-29 NOTE — Telephone Encounter (Signed)
LVM FOR OPU APPT

## 2020-10-04 ENCOUNTER — Ambulatory Visit (INDEPENDENT_AMBULATORY_CARE_PROVIDER_SITE_OTHER): Payer: No Typology Code available for payment source

## 2020-10-04 ENCOUNTER — Encounter: Payer: No Typology Code available for payment source | Admitting: Dermatology

## 2020-10-04 ENCOUNTER — Other Ambulatory Visit: Payer: Self-pay

## 2020-10-04 DIAGNOSIS — Z111 Encounter for screening for respiratory tuberculosis: Secondary | ICD-10-CM

## 2020-10-04 NOTE — Progress Notes (Signed)
Dr Einar Pheasant out of the office  Per orders of Alma Friendly, NP, TB skin test given by Lurlean Nanny Right are  Pt advised to come back 2/25 after 8:50am and before 4pm  Patient tolerated injection well.

## 2020-10-06 LAB — TB SKIN TEST
Induration: 0 mm
TB Skin Test: NEGATIVE

## 2020-10-11 ENCOUNTER — Encounter: Payer: No Typology Code available for payment source | Admitting: Orthotics

## 2020-10-11 ENCOUNTER — Ambulatory Visit: Payer: No Typology Code available for payment source | Admitting: Podiatry

## 2020-10-18 ENCOUNTER — Ambulatory Visit (INDEPENDENT_AMBULATORY_CARE_PROVIDER_SITE_OTHER): Payer: No Typology Code available for payment source | Admitting: Podiatry

## 2020-10-18 ENCOUNTER — Other Ambulatory Visit: Payer: Self-pay | Admitting: Podiatry

## 2020-10-18 ENCOUNTER — Encounter: Payer: Self-pay | Admitting: Podiatry

## 2020-10-18 ENCOUNTER — Other Ambulatory Visit: Payer: Self-pay

## 2020-10-18 DIAGNOSIS — M2142 Flat foot [pes planus] (acquired), left foot: Secondary | ICD-10-CM

## 2020-10-18 DIAGNOSIS — M7661 Achilles tendinitis, right leg: Secondary | ICD-10-CM

## 2020-10-18 DIAGNOSIS — M2141 Flat foot [pes planus] (acquired), right foot: Secondary | ICD-10-CM

## 2020-10-18 DIAGNOSIS — M7671 Peroneal tendinitis, right leg: Secondary | ICD-10-CM

## 2020-10-18 DIAGNOSIS — M7662 Achilles tendinitis, left leg: Secondary | ICD-10-CM

## 2020-10-18 DIAGNOSIS — M7672 Peroneal tendinitis, left leg: Secondary | ICD-10-CM

## 2020-10-18 MED ORDER — NABUMETONE 500 MG PO TABS: 500.0000 mg | ORAL_TABLET | Freq: Two times a day (BID) | ORAL | 2 refills | Status: DC | PRN

## 2020-10-18 NOTE — Progress Notes (Signed)
  Subjective:  Patient ID: Jennifer Ware, female    DOB: [birth year],  MRN: [id number]  Chief Complaint  Patient presents with  . Tendonitis    "they are about the same"   PUO    50 y.o. female returns with the above complaint. History confirmed with patient.  Here today to pick up her orthotics, the tendinitis is about the same.  She took Mobic but got a UTI and had to stop taking it  Objective:  Physical Exam: warm, good capillary refill, no trophic changes or ulcerative lesions, normal DP and PT pulses and normal sensory exam.  Pain on palpation of the peroneal tendons and palpation to the insertion Achilles tendon on the posterior calcaneus.  Has mild pain along the peroneal tendons and with eversion.  Mild pain over the sinus tarsi.  No subfibular pain no medial arch pain.  Overall left worse than right but overall improved since last visit  X-rays of both feet: Posterior and plantar calcaneal enthesophytes, mild pes planus, left foot there is a large dorsal spur over the talar neck  Assessment:   1. Achilles tendinitis of both lower extremities   2. Peroneal tendinitis of both lower legs   3. Pes planus of both feet      Plan:  Patient was evaluated and treated and all questions answered.   Discussed the etiology and treatment options for peroneal tendinitis and Achilles tendinitis including stretching, formal physical therapy, supportive shoegears such as a running shoe or sneaker, pre fabricated orthoses, and oral medications. We also discussed the role of surgical treatment of this for patients who do not improve after exhausting non-surgical treatment options.    -Orthotics were dispensed today and assessed for fit and function.  Reviewed break-in.  Although she has been wearing her other orthotics should be adjusted to these already -Advised that she should continue stretching and icing of the affected limb twice daily. -Rx for Relafen. Advised on risks, benefits, and  alternatives of the medication.  Discontinue Mobic.  Advised to watch for signs of UTI developing advised to drink plain water while she is taking it, she can take it once a day or twice a day. -We discussed physical therapy today she would like to hold off on this for now because she feels like it did not help with her hip and she will consider this at next visit if not improving   Return in about 8 weeks (around 12/13/2020) for re-check Achilles tendon, re-check peroneal tendinitis.

## 2020-10-18 NOTE — Patient Instructions (Addendum)
WEARING INSTRUCTIONS FOR ORTHOTICS  Don't expect to be comfortable wearing your orthotic devices for the first time.  Like eyeglasses, you may be aware of them as time passes, they will not be uncomfortable and you will enjoy wearing them.  FOLLOW THESE INSTRUCTIONS EXACTLY!  1. Wear your orthotic devices for:       Not more than 1 hour the first day.       Not more than 2 hours the second day.       Not more than 3 hours the third day and so on.        Or wear them for as long as they feel comfortable.       If you experience discomfort in your feet or legs take them out.  When feet & legs feel       better, put them back in.  You do need to be consistent and wear them a little        everyday. 2.   If at any time the orthotic devices become acutely uncomfortable before the       time for that particular day, STOP WEARING THEM. 3.   On the next day, do not increase the wearing time. 4.   Subsequently, increase the wearing time by 15-30 minutes only if comfortable to do       so. 5.   You will be seen by your doctor about 2-4 weeks after you receive your orthotic       devices, at which time you will probably be wearing your devices comfortably        for about 8 hours or more a day. 6.   Some patients occasionally report mild aches or discomfort in other parts of the of       body such as the knees, hips or back after 3 or 4 consecutive hours of wear.  If this       is the case with you, do not extend your wearing time.  Instead, cut it back an hour or       two.  In all likelihood, these symptoms will disappear in a short period of time as your       body posture realigns itself and functions more efficiently. 7.   It is possible that your orthotic device may require some small changes or adjustment       to improve their function or make them more comfortable.   This is usually not done       before one to three months have elapsed.  These adjustments are made in        accordance  with the changed position your feet are assuming as a result of       improved biomechanical function. 8.   In women's shoes, it's not unusual for your heel to slip out of the shoe, particularly if       they are step-in-shoes.  If this is the case, try other shoes or other styles.  Try to       purchase shoes which have deeper heal seats or higher heel counters. 9.   Squeaking of orthotics devices in the shoes is due to the movement of the devices       when they are functioning normally.  To eliminate squeaking, simply dust some       baby powder into your shoes before inserting the devices.  If this does not work,          apply soap or wax to the edges of the orthotic devices or put a tissue into the shoes. 10. It is important that you follow these directions explicitly.  Failure to do so will simply       prolong the adjustment period or create problems which are easily avoided.  It makes       no difference if you are wearing your orthotic devices for only a few hours after        several months, so long as you are wearing them comfortably for those hours. 11. If you have any questions or complaints, contact our office.  We have no way of       knowing about your problems unless you tell us.  If we do not hear from you, we will       assume that you are proceeding well.   Peroneal Tendinopathy Rehab Ask your health care provider which exercises are safe for you. Do exercises exactly as told by your health care provider and adjust them as directed. It is normal to feel mild stretching, pulling, tightness, or discomfort as you do these exercises. Stop right away if you feel sudden pain or your pain gets worse. Do not begin these exercises until told by your health care provider. Stretching and range-of-motion exercises These exercises warm up your muscles and joints and improve the movement and flexibility of your ankle. These exercises also help to relieve pain and stiffness. Gastroc and  soleus stretch, standing  This is an exercise in which you stand on a step and use your body weight to stretch your calf muscles. To do this exercise: 1. Stand on the edge of a step on the ball of your left / right foot. The ball of your foot is on the walking surface, right under your toes. 2. Keep your other foot firmly on the same step. 3. Hold on to the wall, a railing, or a chair for balance. 4. Slowly lift your other foot, allowing your body weight to press your left / right heel down over the edge of the step. You should feel a stretch in your left / right calf (gastrocnemius and soleus). 5. Hold this position for 15 seconds. 6. Return both feet to the step. 7. Repeat this exercise with a slight bend in your left / right knee. Repeat 5 times with your left / right knee straight and 5 times with your left / right knee bent. Complete this exercise 2 times a day. Strengthening exercises These exercises build strength and endurance in your foot and ankle. Endurance is the ability to use your muscles for a long time, even after they get tired. Ankle dorsiflexion with band   1. Secure a rubber exercise band or tube to an object, such as a table leg, that will not move when the band is pulled. 2. Secure the other end of the band around your left / right foot. 3. Sit on the floor, facing the object with your left / right leg extended. The band or tube should be slightly tense when your foot is relaxed. 4. Slowly flex your left / right ankle and toes to bring your foot toward you (dorsiflexion). 5. Hold this position for 15 seconds. 6. Let the band or tube slowly pull your foot back to the starting position. Repeat 5 times. Complete this exercise 2 times a day. Ankle eversion 1. Sit on the floor with your legs straight out in front of you. 2. Loop  a rubber exercise band or tube around the ball of your left / right foot. The ball of your foot is on the walking surface, right under your  toes. 3. Hold the ends of the band in your hands, or secure the band to a stable object. The band or tube should be slightly tense when your foot is relaxed. 4. Slowly push your foot outward, away from your other leg (eversion). 5. Hold this position for 15 seconds. 6. Slowly return your foot to the starting position. Repeat 5 times. Complete this exercise 2 times a day. Plantar flexion, standing  This exercise is sometimes called standing heel raise. 1. Stand with your feet shoulder-width apart. 2. Place your hands on a wall or table to steady yourself as needed, but try not to use it for support. 3. Keep your weight spread evenly over the width of your feet while you slowly rise up on your toes (plantar flexion). If told by your health care provider: ? Shift your weight toward your left / right leg until you feel challenged. ? Stand on your left / right leg only. 4. Hold this position for 15 seconds. Repeat 2 times. Complete this exercise 2 times a day. Single leg stand 1. Without shoes, stand near a railing or in a doorway. You may hold on to the railing or door frame as needed. 2. Stand on your left / right foot. Keep your big toe down on the floor and try to keep your arch lifted. ? Do not roll to the outside of your foot. ? If this exercise is too easy, you can try it with your eyes closed or while standing on a pillow. 3. Hold this position for 15 seconds. Repeat 5 times. Complete this exercise 2 times a day. This information is not intended to replace advice given to you by your health care provider. Make sure you discuss any questions you have with your health care provider. Document Revised: 11/17/2018 Document Reviewed: 11/17/2018 Elsevier Patient Education  [place].

## 2020-11-14 ENCOUNTER — Other Ambulatory Visit: Payer: Self-pay

## 2020-12-20 ENCOUNTER — Ambulatory Visit: Payer: No Typology Code available for payment source | Admitting: Podiatry

## 2021-01-03 ENCOUNTER — Ambulatory Visit: Payer: No Typology Code available for payment source | Admitting: Dermatology

## 2021-01-03 ENCOUNTER — Other Ambulatory Visit: Payer: Self-pay

## 2021-01-03 ENCOUNTER — Encounter: Payer: Self-pay | Admitting: Dermatology

## 2021-01-03 DIAGNOSIS — L700 Acne vulgaris: Secondary | ICD-10-CM

## 2021-01-03 MED ORDER — TRETINOIN 0.025 % EX CREA
TOPICAL_CREAM | Freq: Every day | CUTANEOUS | 2 refills | Status: DC
  Filled 2021-01-03: qty 45, 30d supply, fill #0

## 2021-01-03 NOTE — Patient Instructions (Addendum)
For facial moisturizer if you feel you need one, in general, good brands include Vanicream, CeraVe, Cetaphil, or Eucerin facial moisturizer. If you are still oily after starting the tretinoin cream, you may like the Epionce Lytic line  Some Recommended Sunscreens Include:  Clear Options Good for Daily Wear  Cerave AM Moisturizer with SPF EltaMD UV Lotion EltaMD UV Elements CeraVe hydrating sunscreen 50 face  Face Sunscreen Available in Different Tints Colorescience Sunforgettable Total Protection Brush on Shield  bareMinerals Complexion Rescue Tinted Hydrating Gel Cream Broad Spectrum SPF 30 UnSun mineral tinted (comes in medium/dark and light/medium)  Topical retinoid medications like tretinoin/Retin-A, adapalene/Differin, tazarotene/Fabior, and Epiduo/Epiduo Forte can cause dryness and irritation when first started. Only apply a pea-sized amount to the entire affected area. Avoid applying it around the eyes, edges of mouth and creases at the nose. If you experience irritation, use a good moisturizer first and/or apply the medicine less often. If you are doing well with the medicine, you can increase how often you use it until you are applying every night. Be careful with sun protection while using this medication as it can make you sensitive to the sun. This medicine should not be used by pregnant women.   Start out using Tretinoin 3 nights per week, gradually increase as tolerate.    If you have any questions or concerns for your doctor, please call our main line at [phone] and press option 4 to reach your doctor's medical assistant. If no one answers, please leave a voicemail as directed and we will return your call as soon as possible. Messages left after 4 pm will be answered the following business day.   You may also send [place] a message via [place]. We typically respond to MyChart messages within 1-2 business days.  For prescription refills, please ask your pharmacy to contact our  office. Our fax number is [phone].  If you have an urgent issue when the clinic is closed that cannot wait until the next business day, you can page your doctor at the number below.    Please note that while we do our best to be available for urgent issues outside of office hours, we are not available 24/7.   If you have an urgent issue and are unable to reach [place], you may choose to seek medical care at your doctor's office, retail clinic, urgent care center, or emergency room.  If you have a medical emergency, please immediately call 911 or go to the emergency department.  Pager Numbers  - Dr. Nehemiah Massed: [phone]  - Dr. Laurence Ferrari: [phone]  - Dr. Nicole Kindred: [phone]  In the event of inclement weather, please call our main line at [phone] for an update on the status of any delays or closures.  Dermatology Medication Tips: Please keep the boxes that topical medications come in in order to help keep track of the instructions about where and how to use these. Pharmacies typically print the medication instructions only on the boxes and not directly on the medication tubes.   If your medication is too expensive, please contact our office at [phone] option 4 or send [place] a message through [place].   We are unable to tell what your co-pay for medications will be in advance as this is different depending on your insurance coverage. However, we may be able to find a substitute medication at lower cost or fill out paperwork to get insurance to cover a needed medication.   If a prior authorization is required to get  your medication covered by your insurance company, please allow [place] 1-2 business days to complete this process.  Drug prices often vary depending on where the prescription is filled and some pharmacies may offer cheaper prices.  The website www.goodrx.com contains coupons for medications through different pharmacies. The prices here do not account for what the cost may  be with help from insurance (it may be cheaper with your insurance), but the website can give you the price if you did not use any insurance.  - You can print the associated coupon and take it with your prescription to the pharmacy.  - You may also stop by our office during regular business hours and pick up a GoodRx coupon card.  - If you need your prescription sent electronically to a different pharmacy, notify our office through [facility] or by phone at [phone] option 4.

## 2021-01-03 NOTE — Progress Notes (Signed)
   Follow-Up Visit   Subjective  Jennifer Ware is a 50 y.o. female who presents for the following: Acne (3 month follow-up. Face. Using Clindamycin solution and hypochlorous spray. Tolerating Tx regimen well. Reports improvement. )  The following portions of the chart were reviewed this encounter and updated as appropriate:  Tobacco  Allergies  Meds  Problems  Med Hx  Surg Hx  Fam Hx      Review of Systems: No other skin or systemic complaints except as noted in HPI or Assessment and Plan.  Objective  Well appearing patient in no apparent distress; mood and affect are within normal limits.  A focused examination was performed including head, including the scalp, face, neck, nose, ears, eyelids, and lips. Relevant physical exam findings are noted in the Assessment and Plan.  Objective  face: Trace open comedones at face. Back clear  Assessment & Plan  Acne vulgaris face  Chronic condition with duration over one year. Condition is bothersome to patient. Not currently at goal.  Continue Clindamycin solution as directed.   Continue hypochlorous spray as directed.   Start Tretinoin 0.025% cream QHS, start out 3 nights a week, gradually increase as tolerated.   Topical retinoid medications like tretinoin/Retin-A, adapalene/Differin, tazarotene/Fabior, and Epiduo/Epiduo Forte can cause dryness and irritation when first started. Only apply a pea-sized amount to the entire affected area. Avoid applying it around the eyes, edges of mouth and creases at the nose. If you experience irritation, use a good moisturizer first and/or apply the medicine less often. If you are doing well with the medicine, you can increase how often you use it until you are applying every night. Be careful with sun protection while using this medication as it can make you sensitive to the sun. This medicine should not be used by pregnant women.    tretinoin (RETIN-A) 0.025 % cream - face  Other Related  Medications clindamycin (CLEOCIN T) 1 % external solution  Return for acne recheck, 3-4 months.   I, Emelia Salisbury, CMA, am acting as scribe for Forest Gleason, MD.  Documentation: I have reviewed the above documentation for accuracy and completeness, and I agree with the above.  Forest Gleason, MD

## 2021-01-05 ENCOUNTER — Other Ambulatory Visit: Payer: Self-pay

## 2021-01-07 ENCOUNTER — Encounter: Payer: Self-pay | Admitting: Dermatology

## 2021-01-09 ENCOUNTER — Other Ambulatory Visit: Payer: Self-pay

## 2021-01-09 DIAGNOSIS — L700 Acne vulgaris: Secondary | ICD-10-CM

## 2021-01-09 MED ORDER — TRETINOIN 0.025 % EX CREA
TOPICAL_CREAM | Freq: Every day | CUTANEOUS | 2 refills | Status: DC
  Filled 2021-01-09: qty 45, 20d supply, fill #0
  Filled 2021-01-10 – 2021-01-17 (×2): qty 45, 30d supply, fill #0

## 2021-01-09 NOTE — Progress Notes (Signed)
Pt states that pharmacy did not receive rx.

## 2021-01-10 ENCOUNTER — Other Ambulatory Visit: Payer: Self-pay

## 2021-01-11 ENCOUNTER — Other Ambulatory Visit: Payer: Self-pay

## 2021-01-11 ENCOUNTER — Telehealth: Payer: Self-pay

## 2021-01-11 MED ORDER — FLUCONAZOLE 150 MG PO TABS
150.0000 mg | ORAL_TABLET | Freq: Once | ORAL | 0 refills | Status: AC
  Filled 2021-01-11: qty 1, 1d supply, fill #0

## 2021-01-11 NOTE — Telephone Encounter (Signed)
If improved after diflucan can cancel appt

## 2021-01-11 NOTE — Telephone Encounter (Signed)
Pt already has appt scheduled with Dr Einar Pheasant 01/15/21 at 9 AM. I spoke with pt and she does not want to go to [facility] or ED. Starting one wk ago has had vaginal pressure like pain and perineal itching and vaginal discharge is yellow. Pt not having a lot if any urinary symptoms. Pt is going to get diflucan and wait for appt on Mon. No abd pain and no fever. UC & ED precautions given and pt voiced understanding; sending note to Dr cody and Alyse Low CMA.

## 2021-01-11 NOTE — Telephone Encounter (Signed)
[facility] - Client TELEPHONE ADVICE RECORD [facility] Patient Name: Jennifer Ware Gender: Female DOB: [birth year] Age: 50 Y 9 M 13 D Return Phone Number: [phone] (Primary) Address: City/ State/ Zip: [place] [address] Client [facility] - Client Client Site [place] Physician Waunita Schooner- MD Contact Type Call Who Is Calling Patient / Member / Family / Caregiver Call Type Triage / Clinical Relationship To Patient Self Return Phone Number [phone] (Primary) Chief Complaint Vaginal Discharge Reason for Call Symptomatic / Request for [name] states she has a UTI and experiencing vaginal discharge. Translation No Nurse Assessment Nurse: Nyoka Cowden, RN, Tanika Date/Time (Eastern Time): 01/11/2021 10:50:06 AM Confirm and document reason for call. If symptomatic, describe symptoms. ---Caller states she thinks she has a UTI and experiencing vaginal discharge and pain. itching. symp ongoing for a while. Does the patient have any new or worsening symptoms? ---Yes Will a triage be completed? ---Yes Related visit to physician within the last 2 weeks? ---No Does the PT have any chronic conditions? (i.e. diabetes, asthma, this includes High risk factors for pregnancy, etc.) ---Yes List chronic conditions. ---chronic uti, Is the patient pregnant or possibly pregnant? (Ask all females between the ages of 67-55) ---No Is this a behavioral health or substance abuse call? ---No Nurse: Nyoka Cowden, RN, Tanika Date/Time (Eastern Time): 01/11/2021 11:00:02 AM Please select the assessment type ---Standing order Other current medications? ---Unknown Medication allergies? ---Unknown Pharmacy name and phone number. ---[facility] [phone] PLEASE NOTE: All timestamps contained within this report are represented as [place] Standard Time. CONFIDENTIALTY NOTICE: This fax  transmission is intended only for the addressee. It contains information that is legally privileged, confidential or otherwise protected from use or disclosure. If you are not the intended recipient, you are strictly prohibited from reviewing, disclosing, copying using or disseminating any of this information or taking any action in reliance on or regarding this information. If you have received this fax in error, please notify us immediately by telephone so that we can arrange for its return to [place]. Phone: [phone], Toll-Free: [phone], Fax: [phone] Page: 2 of 3 Call Id: [id number] Guidelines Guideline Title Affirmed Question Affirmed Notes Nurse Date/Time Eilene Ghazi Time) Vaginal Symptoms [1] MILDMODERATE pain AND [2] present > 24 hours (Exception: chronic pain) Nyoka Cowden, RN, Ludger Nutting 01/11/2021 10:52:24 AM Disp. Time Eilene Ghazi Time) Disposition Final User 01/11/2021 11:05:31 AM Pharmacy Call Nyoka Cowden, RN, Tanika Reason: VM LEFT 01/11/2021 11:01:10 AM See PCP within 24 Hours Yes Nyoka Cowden, RN, Andria Meuse Disagree/Comply Comply Caller Understands Yes PreDisposition Call Doctor Care Advice Given Per Guideline SEE PCP WITHIN 24 HOURS: CARE ADVICE given per Vaginal Symptoms (Adult) guideline. * You become worse CALL BACK IF: * For pain relief, you can take either acetaminophen, ibuprofen, or naproxen. PAIN MEDICINES: Standing Orders Preparation Additional Instructions Route FrequencyDuration Nurse Comments User Name Diflucan 150 mg 1 tablet Times 1 dose only, ONLY if not able to make appt within recommended time frame Oral Onetime Dose Nyoka Cowden, RN, Ludger Nutting Comments User: Gorden Harms, RN Date/Time Eilene Ghazi Time): 01/11/2021 10:57:58 AM caller states that office can not see her until monday. Referrals REFERRED TO PCP OFFICE PLEASE NOTE: All timestamps contained within this report are represented as [place] Standard Time. CONFIDENTIALTY NOTICE: This fax transmission is intended  only for the addressee. It contains information that is legally privileged, confidential or otherwise protected from use or disclosure. If you are not the intended recipient, you  are strictly prohibited from reviewing, disclosing, copying using or disseminating any of this information or taking any action in reliance on or regarding this information. If you have received this fax in error, please notify us immediately by telephone so that we can arrange for its return to [place]. Phone: [phone], Toll-Free: [phone], Fax: [phone] Page: 3 of 3 Call Id: [id number] [facility] [address], [place] [address] [place] [address] [phone] Fax: [phone] MEDICATION ORDER [place] - Day Date: 01/11/2021 From: QI Department To: Waunita Schooner- MD This is an approved standing order given by our call center nurse on your behalf. Thank you. Date Eilene Ghazi Time): 01/11/2021 10:22:13 AM Triage RN: Gorden Harms, RN NAME: [name] PHONE NUMBER: [phone] (Primary) BIRTHDATE: 1971/04/18 ADDRESS: CITY/STATE/ZIP: [place] [address] CALLER: Self NAME: Rx Given Preparation Additional Instructions Route FrequencyDuration Nurse Comments User Name Diflucan 150 mg 1 tablet Times 1 dose only, ONLY if not able to make appt within recommended time frame Oral Onetime Dose Nyoka Cowden, RN, Tanika No signature

## 2021-01-12 NOTE — Telephone Encounter (Signed)
Left Vm for pt letting her know she can cancel Mondays appt if symptoms improve on diflucan, per Dr. Einar Pheasant.

## 2021-01-15 ENCOUNTER — Encounter: Payer: Self-pay | Admitting: Family Medicine

## 2021-01-15 ENCOUNTER — Ambulatory Visit (INDEPENDENT_AMBULATORY_CARE_PROVIDER_SITE_OTHER): Payer: No Typology Code available for payment source | Admitting: Family Medicine

## 2021-01-15 ENCOUNTER — Other Ambulatory Visit: Payer: Self-pay

## 2021-01-15 VITALS — BP 116/84 | HR 81 | Temp 97.9°F | Ht 62.0 in | Wt 159.0 lb

## 2021-01-15 DIAGNOSIS — T3695XA Adverse effect of unspecified systemic antibiotic, initial encounter: Secondary | ICD-10-CM

## 2021-01-15 DIAGNOSIS — N3000 Acute cystitis without hematuria: Secondary | ICD-10-CM | POA: Diagnosis not present

## 2021-01-15 DIAGNOSIS — B379 Candidiasis, unspecified: Secondary | ICD-10-CM

## 2021-01-15 DIAGNOSIS — N390 Urinary tract infection, site not specified: Secondary | ICD-10-CM | POA: Diagnosis not present

## 2021-01-15 DIAGNOSIS — R350 Frequency of micturition: Secondary | ICD-10-CM

## 2021-01-15 LAB — POCT URINALYSIS DIP (MANUAL ENTRY)
Bilirubin, UA: NEGATIVE
Glucose, UA: NEGATIVE mg/dL
Ketones, POC UA: NEGATIVE mg/dL
Nitrite, UA: NEGATIVE
Protein Ur, POC: NEGATIVE mg/dL
Spec Grav, UA: 1.015 (ref 1.010–1.025)
Urobilinogen, UA: 0.2 E.U./dL
pH, UA: 6 (ref 5.0–8.0)

## 2021-01-15 MED ORDER — CIPROFLOXACIN HCL 500 MG PO TABS
500.0000 mg | ORAL_TABLET | Freq: Two times a day (BID) | ORAL | 0 refills | Status: AC
  Filled 2021-01-15: qty 6, 3d supply, fill #0

## 2021-01-15 MED ORDER — FLUCONAZOLE 150 MG PO TABS
150.0000 mg | ORAL_TABLET | Freq: Once | ORAL | 0 refills | Status: AC
  Filled 2021-01-15: qty 1, 1d supply, fill #0

## 2021-01-15 NOTE — Assessment & Plan Note (Signed)
Recurrent symptoms which no longer seem to be responding to nitrofurantoin and suspect over-use led to a yeast infection. Urine with faint signs of UTI. Will treat with cipro as bactrim allergy as suspect some resistance. Advised not taking nitrofurantoin until we are able to get new urine culture. Discussed if this culture is no growth would recommend f/u with next symptom onset without abx use and if no bacteria will plan urology referral. If able to isolate bacteria and resistant to nitrofurantoin will plan to change abx and continue postcoital therapy prn.

## 2021-01-15 NOTE — Patient Instructions (Signed)
#   Start antibiotic - avoid taking macrobid until we have more information  Fluconazole for after antibiotic if discharge returns or worsens  D-Mannose for preventions

## 2021-01-15 NOTE — Progress Notes (Signed)
Subjective:     Jennifer Ware is a 50 y.o. female presenting for Urinary Tract Infection     HPI   #Dysuria - called in on 01/11/2021 and prescribed diflucan for yeast infection - the discharge has improved - continues to have some pressure - has been taking macrobid for suppression  - not sure if that is why she had the discharge - has been taking macrobid for 6 years - only postcoital - intercourse - 1-2 times per week   Endorses 3 months of frequent urinary symptoms - used to take 1 time after sex w/o symptoms and has been taking bid for 3 days for symptoms   Review of Systems  Gastrointestinal: Negative for diarrhea, nausea and vomiting.  Genitourinary: Positive for dysuria (improved), frequency, urgency and vaginal discharge (improving). Negative for difficulty urinating, flank pain and hematuria.     Social History   Tobacco Use  Smoking Status Never Smoker  Smokeless Tobacco Never Used        Objective:    BP Readings from Last 3 Encounters:  01/15/21 116/84  09/04/20 113/78  08/16/20 100/80   Wt Readings from Last 3 Encounters:  01/15/21 159 lb (72.1 kg)  09/04/20 161 lb (73 kg)  08/21/20 161 lb (73 kg)    BP 116/84   Pulse 81   Temp 97.9 F (36.6 C) (Temporal)   Ht 5\' 2"  (1.575 m)   Wt 159 lb (72.1 kg)   LMP 09/04/2018   SpO2 97%   BMI 29.08 kg/m    Physical Exam Constitutional:      General: She is not in acute distress.    Appearance: She is well-developed. She is not diaphoretic.  HENT:     Right Ear: External ear normal.     Left Ear: External ear normal.     Nose: Nose normal.  Eyes:     Conjunctiva/sclera: Conjunctivae normal.  Cardiovascular:     Rate and Rhythm: Normal rate and regular rhythm.     Heart sounds: No murmur heard.   Pulmonary:     Effort: Pulmonary effort is normal. No respiratory distress.     Breath sounds: Normal breath sounds. No wheezing or rales.  Abdominal:     General: Abdomen is flat. Bowel  sounds are normal. There is no distension.     Palpations: Abdomen is soft.     Tenderness: There is no abdominal tenderness. There is no guarding or rebound.  Musculoskeletal:     Cervical back: Neck supple.  Skin:    General: Skin is warm and dry.     Capillary Refill: Capillary refill takes less than 2 seconds.  Neurological:     Mental Status: She is alert. Mental status is at baseline.  Psychiatric:        Mood and Affect: Mood normal.        Behavior: Behavior normal.           Assessment & Plan:   Problem List Items Addressed This Visit      Genitourinary   Postcoital UTI    Recurrent symptoms which no longer seem to be responding to nitrofurantoin and suspect over-use led to a yeast infection. Urine with faint signs of UTI. Will treat with cipro as bactrim allergy as suspect some resistance. Advised not taking nitrofurantoin until we are able to get new urine culture. Discussed if this culture is no growth would recommend f/u with next symptom onset without abx use and if no bacteria  will plan urology referral. If able to isolate bacteria and resistant to nitrofurantoin will plan to change abx and continue postcoital therapy prn.       Relevant Medications   fluconazole (DIFLUCAN) 150 MG tablet    Other Visit Diagnoses    Urine frequency    -  Primary   Relevant Medications   ciprofloxacin (CIPRO) 500 MG tablet   Other Relevant Orders   POCT urinalysis dipstick (Completed)   Urine Culture   Acute cystitis without hematuria       Relevant Medications   ciprofloxacin (CIPRO) 500 MG tablet   Antibiotic-induced yeast infection       Relevant Medications   fluconazole (DIFLUCAN) 150 MG tablet       Return if symptoms worsen or fail to improve.  Lesleigh Noe, MD  This visit occurred during the SARS-CoV-2 public health emergency.  Safety protocols were in place, including screening questions prior to the visit, additional usage of staff PPE, and extensive  cleaning of exam room while observing appropriate contact time as indicated for disinfecting solutions.

## 2021-01-16 LAB — URINE CULTURE
MICRO NUMBER:: 11972893
SPECIMEN QUALITY:: ADEQUATE

## 2021-01-17 ENCOUNTER — Other Ambulatory Visit: Payer: Self-pay

## 2021-01-22 ENCOUNTER — Encounter: Payer: Self-pay | Admitting: Family Medicine

## 2021-01-22 DIAGNOSIS — R350 Frequency of micturition: Secondary | ICD-10-CM

## 2021-01-22 DIAGNOSIS — N390 Urinary tract infection, site not specified: Secondary | ICD-10-CM

## 2021-01-29 ENCOUNTER — Encounter: Payer: Self-pay | Admitting: Family Medicine

## 2021-01-29 DIAGNOSIS — N950 Postmenopausal bleeding: Secondary | ICD-10-CM

## 2021-02-13 ENCOUNTER — Other Ambulatory Visit: Payer: Self-pay

## 2021-02-13 ENCOUNTER — Encounter: Payer: Self-pay | Admitting: Urology

## 2021-02-13 ENCOUNTER — Ambulatory Visit (INDEPENDENT_AMBULATORY_CARE_PROVIDER_SITE_OTHER): Payer: No Typology Code available for payment source | Admitting: Urology

## 2021-02-13 VITALS — BP 124/84 | HR 73 | Ht 62.0 in | Wt 159.0 lb

## 2021-02-13 DIAGNOSIS — R102 Pelvic and perineal pain: Secondary | ICD-10-CM

## 2021-02-13 DIAGNOSIS — R350 Frequency of micturition: Secondary | ICD-10-CM

## 2021-02-13 MED ORDER — PHENAZOPYRIDINE HCL 95 MG PO TABS
95.0000 mg | ORAL_TABLET | Freq: Three times a day (TID) | ORAL | 0 refills | Status: DC | PRN
  Filled 2021-02-13: qty 10, 4d supply, fill #0

## 2021-02-13 MED ORDER — OXYBUTYNIN CHLORIDE 5 MG PO TABS
5.0000 mg | ORAL_TABLET | Freq: Three times a day (TID) | ORAL | 11 refills | Status: DC | PRN
  Filled 2021-02-13: qty 60, 20d supply, fill #0

## 2021-02-13 NOTE — Patient Instructions (Signed)
For UTI prevention, please take a daily probiotic, d-mannose and cranberry tablets.  Strongly consider the use of topical estrogen cream which would be a pea-sized amount 3 times a week.  This would be applied to your urethra and in the vagina, only a small amount.  If you like to pursue this, let us know and we will call in a prescription.  Lastly, we discussed managing her urinary symptoms.  I prescribed you Ditropan 5 mg as needed for urinary urgency and pelvic pressure related to bladder spasm/bladder pain.  You were also prescribed Pyridium for burning.  You may use these medications as needed to be taken at the time of intercourse or the following day when you are having the most symptoms.  We will have you follow-up once you see your OB/GYN provider.  If her symptoms fail to improve with the above regimen, will plan for cystoscopy in the office.  Also, please look into interstitial cystitis and dietary/lifestyle modifications that are recommended.  These may help you.

## 2021-02-13 NOTE — Progress Notes (Signed)
02/13/2021 12:30 PM   Jennifer Ware Aug 26, 1970 [id number]  Referring provider: Lesleigh Noe, MD [place],  [place] [address]  Chief Complaint  Patient presents with   Urinary Tract Infection    HPI: 50 year old female who presents today for further evaluation of urinary symptoms.  She reports that many years ago, she had postcoital urinary tract infections.  She was prescribed Macrobid to take prophylactically at the time of intercourse which initially helped.  Over the past several years however this quit working.  Has not been using it lately.  She was seen and evaluated by her primary care physician more recently at which time she was treated for presumed possible UTI although urine was fairly bland.  She received 3 days of antibiotics and her symptoms remained stable and unimproved.  Urine culture was negative.  She does take cranberry tablets and probiotics.  She also has been trying what sounds like over-the-counter d-mannose for UTI prevention.  She reports that her primary symptoms include 8 pelvic pressure, urgency and frequency that last almost immediately after intercourse a few days after intercourse and then resolves.  She is symptomatic today and her urine is completely negative.  She also reports that she has had some perimenopausal bleeding.  She not had a menstrual cycle for 2 years but then recently started to have bleeding and clots for 3 days.  She was referred to OB/GYN for further evaluation of this which is pending.  She denies any vaginal symptoms including no vaginal discharge, vaginal dryness, pain with intercourse other than the aforementioned.  No issues with constipation.  No vaginal bulging or prolapse.  Never smoker.     PMH: Past Medical History:  Diagnosis Date   Abdominal pain 10/29/2011   Amenorrhea 10/22/2011   Anxiety    Chronic kidney disease    chronic UTI    Chronic UTI 12/24/2011   Dyspareunia 04/19/2014   Ear  discomfort 07/18/2011   GERD (gastroesophageal reflux disease)    HTN (hypertension) 04/25/2014   Hx gestational diabetes    Irregular periods/menstrual cycles 03/04/2014   Knee crepitus 07/27/2014   Low back pain 06/07/2011   Osteoarthritis of both knees 08/07/2016   Otitis externa 06/07/2011   Palpitations    Sciatica of left side 09/01/2017   Sensation of feeling cold 07/27/2014   SI (sacroiliac) joint dysfunction 09/01/2017   Thyroid disease    Viral URI with cough 09/07/2013    Surgical History: Past Surgical History:  Procedure Laterality Date   BREAST BIOPSY Right 08/24/2014   negative, u/s core   CESAREAN SECTION     x3   HERNIA REPAIR     umbilical   UMBILICAL HERNIA REPAIR      Home Medications:  Allergies as of 02/13/2021       Reactions   Flagyl [metronidazole]    Meloxicam    Silver Sulfadiazine    Sulfa Antibiotics         Medication List        Accurate as of February 13, 2021 12:30 PM. If you have any questions, ask your nurse or doctor.          STOP taking these medications    nabumetone 500 MG tablet Commonly known as: RELAFEN Stopped by: Hollice Espy, MD       TAKE these medications    AMBULATORY NON FORMULARY MEDICATION Thigh-high, medium compression, graduated compression stockings. Apply to lower extremities. [email or web address], Zippered Compression Stockings, medium circ,  long length   ANTIOXIDANT PO Take by mouth daily.   BIOTIN 5000 PO Take by mouth daily.   Calcium 600-200 MG-UNIT tablet Take 2 tablets by mouth daily.   clindamycin 1 % external solution Commonly known as: CLEOCIN T APPLY TO FACE 1 TO 2 TIMES DAILY AS NEEDED FOR ACNE   Cranberry 1000 MG Caps Take 500 mg by mouth as needed.   cyanocobalamin 1000 MCG tablet Take 100 mcg by mouth daily.   Fluocinolone Acetonide 0.01 % Oil PLACE 2 DROPS TO AFFECTED EAR, NO MORE THAN 1 WEEK AT A TIME   Ginkgo Biloba 40 MG Tabs Take 1 tablet by mouth daily.    glucosamine-chondroitin 500-400 MG tablet Take 1 tablet by mouth 2 (two) times daily.   Lecithin 1200 MG Caps Take 1 capsule by mouth as needed.   nitrofurantoin (macrocrystal-monohydrate) 100 MG capsule Commonly known as: MACROBID TAKE AS DIRECTED FOR UTI SUPPRESSION   oxybutynin 5 MG tablet Commonly known as: DITROPAN Take 1 tablet (5 mg total) by mouth 3 (three) times daily as needed for bladder spasms. Started by: Hollice Espy, MD   Pennsaid 2 % Soln Generic drug: Diclofenac Sodium Apply 1 application topically daily as needed. What changed: Another medication with the same name was removed. Continue taking this medication, and follow the directions you see here. Changed by: Hollice Espy, MD   phenazopyridine 95 MG tablet Commonly known as: PYRIDIUM Take 1 tablet (95 mg total) by mouth 3 (three) times daily as needed for pain. Started by: Hollice Espy, MD   tretinoin 0.025 % cream Commonly known as: RETIN-A Apply topically at bedtime.   vitamin C 1000 MG tablet Take 1,000 mg by mouth daily.        Allergies:  Allergies  Allergen Reactions   Flagyl [Metronidazole]    Meloxicam    Silver Sulfadiazine    Sulfa Antibiotics     Family History: Family History  Problem Relation Age of Onset   Asthma Father    Colon cancer Neg Hx    Breast cancer Neg Hx    Colon polyps Neg Hx    Esophageal cancer Neg Hx    Rectal cancer Neg Hx    Stomach cancer Neg Hx     Social History:  reports that she has never smoked. She has never used smokeless tobacco. She reports current alcohol use. She reports that she does not use drugs.   Physical Exam: BP 124/84   Pulse 73   Ht 5\' 2"  (1.575 m)   Wt 159 lb (72.1 kg)   LMP 09/04/2018   BMI 29.08 kg/m   Constitutional:  Alert and oriented, No acute distress. HEENT: [place] AT, moist mucus membranes.  Trachea midline, no masses. Cardiovascular: No clubbing, cyanosis, or edema. Respiratory: Normal respiratory effort, no  increased work of breathing. Skin: No rashes, bruises or suspicious lesions. Neurologic: Grossly intact, no focal deficits, moving all 4 extremities. Psychiatric: Normal mood and affect.  Laboratory Data: Lab Results  Component Value Date   WBC 5.1 08/16/2020   HGB 13.6 08/16/2020   HCT 41.0 08/16/2020   MCV 86.9 08/16/2020   PLT 123.0 (L) 08/16/2020    Lab Results  Component Value Date   CREATININE 0.89 08/16/2020    Lab Results  Component Value Date   HGBA1C 5.4 08/16/2020    Urinalysis Urine dip today is negative.  Microscopic exam is also negative, no RBCs or WBCs.  Nitrate negative.   Assessment & Plan:  1. Frequent urination We had a lengthy discussion today about irritative urinary symptoms following intercourse for several days including urgency frequency pelvic pressure including differential diagnosis  It does not appear at this point in time that these are in fact true urinary tract infections, urinalysis today is completely negative despite her having symptoms  We discussed alternative differential diagnoses including bladder irritation, underlying GYN issues, or possibly even interstitial cystitis or atrophic vaginitis is contributing factors.  We did go ahead and discuss UTI prevention techniques including hygiene, daily probiotics, cranberry tablets and d-mannose is prevention strategies.  In addition to this, she was provided with information about IC and IC dietary recommendations.  We discussed treating her symptomatically at this point in time, trial of as needed oxybutynin and Pyridium in combination around the time of intercourse to see if this helps with her symptoms  Will have her follow-up in about 6 weeks to reassess her urinary symptoms after GYN evaluation to ensure that this is not a contributing factor.  If her symptoms fail to resolve, will consider cystoscopy to rule out any underlying issues although not suspected especially in the setting  of negative urinalysis and never smoker, minimal risk factors.  Lastly, I do think that she would likely benefit from topical estrogen cream per urethra and vagina given that her symptoms seem to be exacerbated with the postmenopausal/perimenopausal state.  She is very hesitant to want a pursue this due to her concern for malignancy.  We discussed low systemic absorption rate for topical estrogen at very low doses, minimal risk for malignancy.  Ultimately, she like to pursue this, will prescribe. - Urinalysis, Complete  2. Pelvic pressure in female As above   F/u 6 weeks for reeval, possible cysto  Hollice Espy, MD  [facility] [address], [place], [place] [address] [phone]  I spent 45 total minutes on the day of the encounter including pre-visit review of the medical record, face-to-face time with the patient, and post visit ordering of labs/imaging/tests.

## 2021-02-14 ENCOUNTER — Other Ambulatory Visit: Payer: Self-pay

## 2021-02-14 ENCOUNTER — Ambulatory Visit (INDEPENDENT_AMBULATORY_CARE_PROVIDER_SITE_OTHER): Payer: No Typology Code available for payment source | Admitting: Family Medicine

## 2021-02-14 ENCOUNTER — Encounter: Payer: Self-pay | Admitting: Family Medicine

## 2021-02-14 VITALS — BP 100/70 | HR 67 | Temp 97.5°F | Ht 62.0 in | Wt 159.2 lb

## 2021-02-14 DIAGNOSIS — N950 Postmenopausal bleeding: Secondary | ICD-10-CM | POA: Diagnosis not present

## 2021-02-14 DIAGNOSIS — R102 Pelvic and perineal pain: Secondary | ICD-10-CM | POA: Insufficient documentation

## 2021-02-14 LAB — MICROSCOPIC EXAMINATION: Bacteria, UA: NONE SEEN

## 2021-02-14 LAB — CBC
HCT: 38.7 % (ref 36.0–46.0)
Hemoglobin: 13.1 g/dL (ref 12.0–15.0)
MCHC: 33.7 g/dL (ref 30.0–36.0)
MCV: 86.4 fl (ref 78.0–100.0)
Platelets: 123 10*3/uL — ABNORMAL LOW (ref 150.0–400.0)
RBC: 4.48 Mil/uL (ref 3.87–5.11)
RDW: 14.1 % (ref 11.5–15.5)
WBC: 4.8 10*3/uL (ref 4.0–10.5)

## 2021-02-14 LAB — URINALYSIS, COMPLETE
Bilirubin, UA: NEGATIVE
Glucose, UA: NEGATIVE
Ketones, UA: NEGATIVE
Leukocytes,UA: NEGATIVE
Nitrite, UA: NEGATIVE
Protein,UA: NEGATIVE
Specific Gravity, UA: 1.02 (ref 1.005–1.030)
Urobilinogen, Ur: 0.2 mg/dL (ref 0.2–1.0)
pH, UA: 6 (ref 5.0–7.5)

## 2021-02-14 NOTE — Progress Notes (Signed)
Subjective:     Jennifer Ware is a 50 y.o. female presenting for post-menopausal bleeding (Bled for 3 days but has now stopped. Has not heard from OBGYN yet. )     HPI  #Postmenopausal bleeding - 6/15 - bled for 3 days - last week had sex with cramping, urgency, frequency, cramping - feels confident bleeding was vaginal  - no lightheadedness - burning w/ urination is improving - was given medication from urology w/ improvement - also getting abdominal pressure/cramping with intercourse   Review of Systems  01/29/2021: MyChart - LMP 09/04/2018 and then 01/24/2021 cramps and bleeding w/ clots x 3 days  Social History   Tobacco Use  Smoking Status Never  Smokeless Tobacco Never        Objective:    BP Readings from Last 3 Encounters:  02/14/21 100/70  02/13/21 124/84  01/15/21 116/84   Wt Readings from Last 3 Encounters:  02/14/21 159 lb 4 oz (72.2 kg)  02/13/21 159 lb (72.1 kg)  01/15/21 159 lb (72.1 kg)    BP 100/70   Pulse 67   Temp (!) 97.5 F (36.4 C) (Temporal)   Ht 5\' 2"  (1.575 m)   Wt 159 lb 4 oz (72.2 kg)   LMP 09/04/2018   SpO2 98%   BMI 29.13 kg/m    Physical Exam Constitutional:      General: She is not in acute distress.    Appearance: She is well-developed. She is not diaphoretic.  HENT:     Right Ear: External ear normal.     Left Ear: External ear normal.     Nose: Nose normal.  Eyes:     Conjunctiva/sclera: Conjunctivae normal.  Cardiovascular:     Rate and Rhythm: Normal rate and regular rhythm.     Heart sounds: No murmur heard. Pulmonary:     Effort: Pulmonary effort is normal. No respiratory distress.     Breath sounds: Normal breath sounds. No wheezing.  Abdominal:     General: Abdomen is flat. Bowel sounds are normal. There is no distension.     Palpations: Abdomen is soft.     Tenderness: There is no abdominal tenderness. There is no guarding or rebound.  Musculoskeletal:     Cervical back: Neck supple.  Skin:     General: Skin is warm and dry.     Capillary Refill: Capillary refill takes less than 2 seconds.  Neurological:     Mental Status: She is alert. Mental status is at baseline.  Psychiatric:        Mood and Affect: Mood normal.        Behavior: Behavior normal.          Assessment & Plan:   Problem List Items Addressed This Visit       Other   Postmenopausal bleeding - Primary    Discussed greatest concern would be cancer but could also be fibroid/polyp. No signs of anemia, however, with clotting will check CBC to evaluate. Has ob/gyn referral and number provided to patient call. Discussed TV ultrasound to get more information while awaiting GYN consult - discussed she may need a biopsy with GYN. She will call if return bleeding or new symptoms       Relevant Orders   US PELVIC COMPLETE WITH TRANSVAGINAL   CBC   Pelvic cramping    She notes cramping after intercourse associated with urinary symptoms. Cramping with bleeding resolved. Discussed continued evaluation with urology and gyn, however, if work-up  unrevealing and intercourse trigger persists may want to consider pelvic PT.          Return if symptoms worsen or fail to improve.  Lesleigh Noe, MD  This visit occurred during the SARS-CoV-2 public health emergency.  Safety protocols were in place, including screening questions prior to the visit, additional usage of staff PPE, and extensive cleaning of exam room while observing appropriate contact time as indicated for disinfecting solutions.

## 2021-02-14 NOTE — Assessment & Plan Note (Signed)
She notes cramping after intercourse associated with urinary symptoms. Cramping with bleeding resolved. Discussed continued evaluation with urology and gyn, however, if work-up unrevealing and intercourse trigger persists may want to consider pelvic PT.

## 2021-02-14 NOTE — Patient Instructions (Signed)
Bleeding - blood work - ultrasound - checking on referral as this will be important    Abdominal cramping with intercourse - lets focus on ob/gyn and urology - but can also think about pelvic physical therapy in the future

## 2021-02-14 NOTE — Assessment & Plan Note (Signed)
Discussed greatest concern would be cancer but could also be fibroid/polyp. No signs of anemia, however, with clotting will check CBC to evaluate. Has ob/gyn referral and number provided to patient call. Discussed TV ultrasound to get more information while awaiting GYN consult - discussed she may need a biopsy with GYN. She will call if return bleeding or new symptoms

## 2021-03-27 ENCOUNTER — Other Ambulatory Visit: Payer: Self-pay | Admitting: Urology

## 2021-03-27 NOTE — Progress Notes (Signed)
   03/28/21  CC:  Chief Complaint  Patient presents with   Urinary Frequency     HPI: Jennifer Ware is a 50 y.o.female who presents today for cystoscopy.   She was last seen in office 02/13/2021 for further evaluation of urinary symptoms. She was experiencing pelvic pressure, urgency, and frequency that lasted almost immediately after intercourse and the resolved.   She followed up with her OB/GYN for further evaluation on perimenopausal bleeding. Dr. Waunita Schooner discussed greatest concern being cancer but could also be fibroid/polyp. She recommended biopsy with gynecologist.  She will be seeing them in the near future.  She is feeling better today. She is still not able to handle intercourse. She states her symptoms get worse depending on what she eats.    She states she has mild improvement with oxybutynin on her bladder issues but contributes to GI problems.     Vitals:   03/28/21 1331  BP: 111/69  Pulse: 80   NED. A&Ox3.   No respiratory distress   Abd soft, NT, ND Normal external genitalia with patent urethral meatus  Cystoscopy Procedure Note  Patient identification was confirmed, informed consent was obtained, and patient was prepped using Betadine solution.  Lidocaine jelly was administered per urethral meatus.    Procedure: - Flexible cystoscope introduced, without any difficulty.   - Thorough search of the bladder revealed:    normal urethral meatus    normal urothelium with extrinsic posterior bladder wall compression possibly representing  uterine fibroids or other extrinsic bladder mass     no stones    no ulcers     no tumors    no urethral polyps    no trabeculation        - Ureteral orifices were normal in position and appearance.  Post-Procedure: - Patient tolerated the procedure well  Assessment/ Plan:  Frequent urination - improved slightly with dietary changes  - on oxybutynin   2. Dyspareunia  - Agree with pelvic floor therapy as  mentioned in Dr. Verda Cumins last note, will place referral as I suspect there is some component of vaginismus versus pelvic floor dysfunction - Discussed use of vaginal valium. Counseled her in how  to use it and precautions  - Recommend following up with her OB/GYN provider   3. Pelvic pressure ? Uterine fibroid  - referral pending   Follow-up in 3 months.   I,Kailey Littlejohn,acting as a Education administrator for Hollice Espy, MD.,have documented all relevant documentation on the behalf of Hollice Espy, MD,as directed by  Hollice Espy, MD while in the presence of Hollice Espy, MD.  I have reviewed the above documentation for accuracy and completeness, and I agree with the above.   Hollice Espy, MD

## 2021-03-28 ENCOUNTER — Other Ambulatory Visit: Payer: Self-pay

## 2021-03-28 ENCOUNTER — Ambulatory Visit (INDEPENDENT_AMBULATORY_CARE_PROVIDER_SITE_OTHER): Payer: No Typology Code available for payment source | Admitting: Urology

## 2021-03-28 VITALS — BP 111/69 | HR 80 | Ht 62.0 in | Wt 159.0 lb

## 2021-03-28 DIAGNOSIS — R350 Frequency of micturition: Secondary | ICD-10-CM

## 2021-03-28 DIAGNOSIS — R102 Pelvic and perineal pain: Secondary | ICD-10-CM

## 2021-03-28 MED ORDER — DIAZEPAM 10 MG PO TABS
10.0000 mg | ORAL_TABLET | ORAL | 0 refills | Status: DC | PRN
  Filled 2021-03-28 – 2021-05-08 (×2): qty 10, 10d supply, fill #0

## 2021-03-29 LAB — URINALYSIS, COMPLETE
Bilirubin, UA: NEGATIVE
Glucose, UA: NEGATIVE
Ketones, UA: NEGATIVE
Leukocytes,UA: NEGATIVE
Nitrite, UA: NEGATIVE
Protein,UA: NEGATIVE
Specific Gravity, UA: 1.005 (ref 1.005–1.030)
Urobilinogen, Ur: 0.2 mg/dL (ref 0.2–1.0)
pH, UA: 6 (ref 5.0–7.5)

## 2021-03-29 LAB — MICROSCOPIC EXAMINATION
Bacteria, UA: NONE SEEN
Epithelial Cells (non renal): NONE SEEN /hpf (ref 0–10)

## 2021-04-06 ENCOUNTER — Encounter: Payer: No Typology Code available for payment source | Admitting: Obstetrics and Gynecology

## 2021-04-09 ENCOUNTER — Other Ambulatory Visit: Payer: Self-pay

## 2021-04-10 ENCOUNTER — Encounter: Payer: No Typology Code available for payment source | Admitting: Family Medicine

## 2021-04-11 ENCOUNTER — Ambulatory Visit: Payer: No Typology Code available for payment source | Admitting: Dermatology

## 2021-04-12 ENCOUNTER — Telehealth: Payer: Self-pay | Admitting: Family Medicine

## 2021-04-12 NOTE — Telephone Encounter (Signed)
My chart message sen to patient

## 2021-04-12 NOTE — Telephone Encounter (Signed)
Pt called to follow up on Pelvic US

## 2021-04-23 ENCOUNTER — Ambulatory Visit
Admission: RE | Admit: 2021-04-23 | Discharge: 2021-04-23 | Disposition: A | Discharge status: Home / Self Care | Payer: No Typology Code available for payment source | Source: Ambulatory Visit | Attending: Family Medicine | Admitting: Family Medicine

## 2021-04-23 DIAGNOSIS — N950 Postmenopausal bleeding: Secondary | ICD-10-CM

## 2021-04-24 ENCOUNTER — Telehealth: Payer: Self-pay

## 2021-04-24 ENCOUNTER — Other Ambulatory Visit: Payer: No Typology Code available for payment source

## 2021-04-24 NOTE — Telephone Encounter (Signed)
Noted.   Thickened endometrium - recommend sampling.   Pt has appt with GYN Dr. Dione Plover on 10/7.   Will plan to route results to him to see if she should be seen sooner.

## 2021-04-24 NOTE — Telephone Encounter (Signed)
Jennifer Ware with [facility] called report on US pelvis with transvaginal. Report is in Epic and copy taken to Dr Einar Pheasant.

## 2021-04-25 ENCOUNTER — Other Ambulatory Visit: Payer: Self-pay

## 2021-04-25 ENCOUNTER — Telehealth: Payer: Self-pay | Admitting: Family Medicine

## 2021-04-25 MED ORDER — MISOPROSTOL 200 MCG PO TABS
400.0000 ug | ORAL_TABLET | Freq: Once | ORAL | 0 refills | Status: DC
  Filled 2021-04-25 – 2021-05-08 (×2): qty 2, 1d supply, fill #0

## 2021-04-25 NOTE — Telephone Encounter (Signed)
My chart to pt

## 2021-04-27 ENCOUNTER — Other Ambulatory Visit: Payer: Self-pay

## 2021-04-27 MED ORDER — OXYCODONE-ACETAMINOPHEN 5-325 MG PO TABS
1.0000 | ORAL_TABLET | ORAL | 0 refills | Status: DC | PRN
  Filled 2021-04-27 – 2021-05-08 (×2): qty 3, 1d supply, fill #0

## 2021-05-04 ENCOUNTER — Other Ambulatory Visit: Payer: Self-pay

## 2021-05-08 ENCOUNTER — Other Ambulatory Visit: Payer: Self-pay

## 2021-05-16 ENCOUNTER — Encounter: Payer: No Typology Code available for payment source | Admitting: Obstetrics and Gynecology

## 2021-05-18 ENCOUNTER — Other Ambulatory Visit: Payer: Self-pay

## 2021-05-18 ENCOUNTER — Other Ambulatory Visit (HOSPITAL_COMMUNITY)
Admission: RE | Admit: 2021-05-18 | Discharge: 2021-05-18 | Disposition: A | Discharge status: Home / Self Care | Payer: No Typology Code available for payment source | Source: Ambulatory Visit | Attending: Family Medicine | Admitting: Family Medicine

## 2021-05-18 ENCOUNTER — Encounter: Payer: Self-pay | Admitting: Family Medicine

## 2021-05-18 ENCOUNTER — Ambulatory Visit (INDEPENDENT_AMBULATORY_CARE_PROVIDER_SITE_OTHER): Payer: No Typology Code available for payment source | Admitting: Family Medicine

## 2021-05-18 VITALS — BP 132/83 | HR 70 | Ht 63.0 in | Wt 155.6 lb

## 2021-05-18 DIAGNOSIS — N950 Postmenopausal bleeding: Secondary | ICD-10-CM

## 2021-05-18 NOTE — Progress Notes (Signed)
GYNECOLOGY OFFICE VISIT NOTE  History:   Jennifer Ware is a 50 y.o. [phone] here today for post menopausal bleeding.  Saw PCP in July for post menopausal bleeding Had heavy bleeding with clots for 3 days, has not had any since No abdominal pain currently Had menopause around four years ago Not a smoker No family hx of cancer Had [place] that showed fibroid uterus, mildly thickened endometrium of 5 mm  Health Maintenance Due  Topic Date Due   Hepatitis C Screening  Never done   Zoster Vaccines- Shingrix (1 of 2) Never done   COVID-19 Vaccine (4 - Booster for Pfizer series) 10/21/2020   INFLUENZA VACCINE  03/12/2021    Past Medical History:  Diagnosis Date   Abdominal pain 10/29/2011   Amenorrhea 10/22/2011   Anxiety    Chronic kidney disease    chronic UTI    Chronic UTI 12/24/2011   Dyspareunia 04/19/2014   Ear discomfort 07/18/2011   GERD (gastroesophageal reflux disease)    HTN (hypertension) 04/25/2014   Hx gestational diabetes    Irregular periods/menstrual cycles 03/04/2014   Knee crepitus 07/27/2014   Low back pain 06/07/2011   Osteoarthritis of both knees 08/07/2016   Otitis externa 06/07/2011   Palpitations    Sciatica of left side 09/01/2017   Sensation of feeling cold 07/27/2014   SI (sacroiliac) joint dysfunction 09/01/2017   Thyroid disease    Viral URI with cough 09/07/2013    Past Surgical History:  Procedure Laterality Date   BREAST BIOPSY Right 08/24/2014   negative, u/s core   CESAREAN SECTION     x3   HERNIA REPAIR     umbilical   UMBILICAL HERNIA REPAIR      The following portions of the patient's history were reviewed and updated as appropriate: allergies, current medications, past family history, past medical history, past social history, past surgical history and problem list.   Health Maintenance:   Last pap: Lab Results  Component Value Date   DIAGPAP  08/16/2020    - Negative for intraepithelial lesion or malignancy (NILM)   HPV NOT  DETECTED 08/11/2017   [place] Negative 08/16/2020    Last mammogram:  [facility]-1 06/12/2020    Review of Systems:  Pertinent items noted in HPI and remainder of comprehensive ROS otherwise negative.  Physical Exam:  BP 132/83   Pulse 70   Ht 5\' 3"  (1.6 m)   Wt 155 lb 9.6 oz (70.6 kg)   LMP 09/04/2018   BMI 27.56 kg/m  CONSTITUTIONAL: Well-developed, well-nourished female in no acute distress.  HEENT:  Normocephalic, atraumatic. External right and left ear normal. No scleral icterus.  NECK: Normal range of motion, supple, no masses noted on observation SKIN: No rash noted. Not diaphoretic. No erythema. No pallor. MUSCULOSKELETAL: Normal range of motion. No edema noted. NEUROLOGIC: Alert and oriented to person, place, and time. Normal muscle tone coordination.  PSYCHIATRIC: Normal mood and affect. Normal behavior. Normal judgment and thought content. RESPIRATORY: Effort normal, no problems with respiration noted PELVIC: Normal appearing external genitalia; normal appearing vaginal mucosa and cervix.  No abnormal discharge noted.   Labs and Imaging No results found for this or any previous visit (from the past 168 hour(s)). US PELVIC COMPLETE WITH TRANSVAGINAL  Result Date: 04/24/2021 CLINICAL DATA:  Bleeding for 3 days Pelvic pain with intercourse EXAM: TRANSABDOMINAL AND TRANSVAGINAL ULTRASOUND OF PELVIS TECHNIQUE: Both transabdominal and transvaginal ultrasound examinations of the pelvis were performed. Transabdominal technique was performed for global imaging  of the pelvis including uterus, ovaries, adnexal regions, and pelvic cul-de-sac. It was necessary to proceed with endovaginal exam following the transabdominal exam to visualize the endometrium and uterus. COMPARISON:  12/19/2011 FINDINGS: Uterus Measurements: 9.5 x 4.1 x 5.7 cm = volume: 117 mL. Two uterine fibroids are seen in the fundus measuring 3.1 x 2.9 x 2.8 cm and 1.5 x 1.0 x 1.5 cm Endometrium Thickness: 5 mm.  No focal  abnormality visualized. Right ovary Measurements: 1.8 x 2.0 x 1.5 cm = volume: 2.7 mL. Normal appearance/no adnexal mass. Left ovary Not visualized Other findings No abnormal free fluid. IMPRESSION: 1. Fibroid uterus. 2. Thickened endometrium measuring up to 5 mm. Given history of postmenopausal hemorrhage, findings could be related to neoplasia or hyperplasia. Further evaluation with tissue sampling should be considered. These results will be called to the ordering clinician or representative by the Radiologist Assistant, and communication documented in the PACS or [employer]. These results will be called to the ordering clinician or representative by the Radiologist Assistant, and communication documented in the PACS or [employer]. Electronically Signed   By: Miachel Roux M.D.   On: 04/24/2021 08:45      Assessment and Plan:   Problem List Items Addressed This Visit       Other   Post-menopausal bleeding - Primary    Abnormal bleeding, minimally thickened endometrium. EMB obtained without difficulty, see procedure note. Will follow up by telephone, return precautions discussed.       Relevant Orders   Surgical pathology( / [facility])    Routine preventative health maintenance measures emphasized. Please refer to After Visit Summary for other counseling recommendations.   Return for once biopsy results are available.    Total face-to-face time with patient: 15 minutes.  Over 50% of encounter was spent on counseling and coordination of care.   Clarnce Flock, MD/MPH Attending Family Medicine Physician, [facility] for [facility], [place]

## 2021-05-18 NOTE — Assessment & Plan Note (Signed)
Abnormal bleeding, minimally thickened endometrium. EMB obtained without difficulty, see procedure note. Will follow up by telephone, return precautions discussed.

## 2021-05-18 NOTE — Progress Notes (Signed)
    GYNECOLOGY CLINIC ENDOMETRIAL BIOPSY PROCEDURE NOTE  Jennifer Ware is a y 50 y.o. [phone] here for endometrial biopsy for postmenopausal bleeding.  Discussed nature of the procedure and risks and benefits.   Allergies  Allergen Reactions   Flagyl [Metronidazole]    Meloxicam    Silver Sulfadiazine    Sulfa Antibiotics     Patient given informed consent, signed copy in the chart, time out was performed.    Patient had previously taken misoprostol I prescribed about 10 hours prior to the procedure.   The patient was placed in the lithotomy position and the cervix brought into view with sterile speculum. The uterus was sounded for depth of 9 cm. A pipelle was introduced to into the uterus, suction created,  and an endometrial sample was obtained. A second pass was obtained due to scanty tissue with first pass. All equipment was removed and accounted for.  The patient tolerated the procedure well.   Patient given post procedure instructions.  Per patient preference she will be notified of results by telephone.  Clarnce Flock, MD/MPH Attending Family Medicine Physician, [facility] for [facility], [place]

## 2021-05-21 LAB — SURGICAL PATHOLOGY

## 2021-05-21 NOTE — Progress Notes (Signed)
Benign pathology Attempted to call patient to give results by phone, no answer, left voicemail asking her to call us back Clinical staff please call patient to notify that bleeding likely due to endocervical polyp and endometrial atrophy, no further workup needed unless she has significant bleeding again FYI to PCP

## 2021-05-22 ENCOUNTER — Telehealth: Payer: Self-pay | Admitting: Family Medicine

## 2021-05-22 NOTE — Telephone Encounter (Signed)
Pt called stating she would like to talk to you about some negative results. Pt also stated that she would like to talk to you about something private.

## 2021-05-22 NOTE — Telephone Encounter (Signed)
Please call pt and find out what results she would like to discuss.   If able please get more information for her other concern - an office visit may be more appropriate if new concern.

## 2021-05-23 ENCOUNTER — Encounter: Payer: Self-pay | Admitting: Family Medicine

## 2021-05-23 DIAGNOSIS — R102 Pelvic and perineal pain: Secondary | ICD-10-CM

## 2021-05-23 NOTE — Addendum Note (Signed)
Addended by: Waunita Schooner R on: 05/23/2021 01:27 PM   Modules accepted: Orders

## 2021-05-23 NOTE — Telephone Encounter (Signed)
Pt has discussed issues with Dr. Einar Pheasant via Deloris Ping.

## 2021-05-28 ENCOUNTER — Other Ambulatory Visit: Payer: Self-pay

## 2021-05-28 MED FILL — Clindamycin Phosphate Soln 1%: CUTANEOUS | 30 days supply | Qty: 60 | Fill #0 | Status: AC

## 2021-05-28 MED FILL — Fluocinolone Acetonide (Otic) Oil 0.01%: OTIC | 7 days supply | Qty: 20 | Fill #0 | Status: AC

## 2021-07-04 ENCOUNTER — Ambulatory Visit: Payer: No Typology Code available for payment source | Admitting: Urology

## 2021-07-16 ENCOUNTER — Other Ambulatory Visit: Payer: Self-pay | Admitting: Family Medicine

## 2021-07-16 DIAGNOSIS — Z1231 Encounter for screening mammogram for malignant neoplasm of breast: Secondary | ICD-10-CM

## 2021-07-20 ENCOUNTER — Encounter: Payer: Self-pay | Admitting: Physical Therapy

## 2021-07-20 ENCOUNTER — Ambulatory Visit: Payer: No Typology Code available for payment source | Attending: Family Medicine | Admitting: Physical Therapy

## 2021-07-20 ENCOUNTER — Other Ambulatory Visit: Payer: Self-pay

## 2021-07-20 DIAGNOSIS — R102 Pelvic and perineal pain: Secondary | ICD-10-CM | POA: Diagnosis not present

## 2021-07-20 DIAGNOSIS — R278 Other lack of coordination: Secondary | ICD-10-CM | POA: Diagnosis present

## 2021-07-20 DIAGNOSIS — G8929 Other chronic pain: Secondary | ICD-10-CM | POA: Insufficient documentation

## 2021-07-20 DIAGNOSIS — M5442 Lumbago with sciatica, left side: Secondary | ICD-10-CM | POA: Insufficient documentation

## 2021-07-20 DIAGNOSIS — M533 Sacrococcygeal disorders, not elsewhere classified: Secondary | ICD-10-CM | POA: Diagnosis not present

## 2021-07-20 DIAGNOSIS — M6208 Separation of muscle (nontraumatic), other site: Secondary | ICD-10-CM | POA: Insufficient documentation

## 2021-07-20 NOTE — Patient Instructions (Signed)
  Avoid straining pelvic floor, abdominal muscles , spine  Use log rolling technique instead of getting out of bed with your neck or the sit-up     Log rolling into and out of bed   Log rolling into and out of bed If getting out of bed on R side, Bent knees, scoot hips/ shoulder to L  Raise R arm completely overhead, rolling onto armpit  Then lower bent knees to bed to get into complete side lying position  Then drop legs off bed, and push up onto R elbow/forearm, and use L hand to push onto the bed  ---   Proper body mechanics with getting out of a chair to decrease strain  on back &pelvic floor   Avoid holding your breath when Getting out of the chair:  Scoot to front part of chair chair Heels behind knees, feet are hip width apart, nose over toes  Inhale like you are smelling roses Exhale to stand   __  Exhale with lifting Ski track stance with dumbbells as alternative to be more stable , switch after 5 reps  Not lock knees in kettlebells , relax pelvic floor   __  Watch out at work: no standing on one leg  Equal weight on both legs  Use ski track stance   WOnderwoman stance   __  Stretch for pelvic floor    On belly: Riding horse edge of mattress  knee bent like riding a horse, move knee towards armpit and out  10 reps  On belly: Frog stretch: laying on belly with pillow under hips, knees bent, inhale do nothing, exhale let ankles fall apart  10 reps  __

## 2021-07-20 NOTE — Therapy (Addendum)
[place] MAIN [facility] SERVICES [place], [place], [address] Phone: [phone]   Fax:  [phone]  Physical Therapy Evaluation  Patient Details  Name: Jennifer Ware MRN: [id number] Date of Birth: Oct 21, 1970 Referring Provider (PT): Einar Pheasant MD   Encounter Date: 07/20/2021   PT End of Session - 07/20/21 1110     Visit Number 1    Number of Visits 10    Date for PT Re-Evaluation 09/28/21    PT Start Time 1102    PT Stop Time 1200    PT Time Calculation (min) 58 min    Activity Tolerance Patient tolerated treatment well;No increased pain    Behavior During Therapy [facility] for tasks assessed/performed             Past Medical History:  Diagnosis Date   Abdominal pain 10/29/2011   Amenorrhea 10/22/2011   Anxiety    Chronic kidney disease    chronic UTI    Chronic UTI 12/24/2011   Dyspareunia 04/19/2014   Ear discomfort 07/18/2011   GERD (gastroesophageal reflux disease)    HTN (hypertension) 04/25/2014   Hx gestational diabetes    Irregular periods/menstrual cycles 03/04/2014   Knee crepitus 07/27/2014   Low back pain 06/07/2011   Osteoarthritis of both knees 08/07/2016   Otitis externa 06/07/2011   Palpitations    Sciatica of left side 09/01/2017   Sensation of feeling cold 07/27/2014   SI (sacroiliac) joint dysfunction 09/01/2017   Thyroid disease    Viral URI with cough 09/07/2013    Past Surgical History:  Procedure Laterality Date   BREAST BIOPSY Right 08/24/2014   negative, u/s core   CESAREAN SECTION     x3   HERNIA REPAIR     umbilical   UMBILICAL HERNIA REPAIR      There were no vitals filed for this visit.    Subjective Assessment - 07/20/21 1121     Subjective 1) Urinary frequency:  When she gets UTI Sx after having sexual intercourse, frequency and urgency increases from once every 2 hours to 2-3 per 2 hours. It feels like her bladder feels. Pt is able to empty her bladder completely. It just to not be  like that. Pt has had this Sx for years but since getting perimenopausal Sx, it has worsened.    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 Pt completed PT and had injections. Back pain was better after PT but pain was still there.  Currently, pain is 2/10. Pain at its worst 7/10 with moving alot. not sitting not properly, leg press machine. Pain radiates down side of the leg and ends at the calf. Workout routine: 4-5 x week. Lifting weights on machine and dumbbells, kettle bells, sometimes crunches, planks, Elliptical. Pt has a stretching routine.      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    Limitations Walking;Standing    Patient Stated Goals less pelvic and LBP, have sex with no difficulty                [facility] PT Assessment - 07/20/21 1128       Assessment   Medical Diagnosis  Pelvic pain    Referring Provider (PT) Einar Pheasant MD      Precautions   Precautions None      Restrictions   Weight Bearing Restrictions No      Balance Screen   Has the patient fallen in the past 6 months No      Observation/Other Assessments   Observations crossed thighs      Other:   Other/ Comments bilateral stance with perturbation with 10# dumbbells in BUE, kettle bell swings 15lbs with hyperextended knees/ posterior tilt of pelvis      Palpation   SI assessment  L iliac crest higher, tightness and tenderness at L coccygeus, tenderness at coccyx , L sacrum / deviated L . Pre-Tx, LBP/SIJ pain with hooklying Post Tx: no pain      Bed Mobility   Bed Mobility --   straining techniques     Ambulation/Gait   Gait Comments limited thoracic rotation, excessive anterior rotation L, limited posterior rotation R  shoulder/ pelvis                        Objective measurements completed on examination: See above findings.     Pelvic Floor Special Questions - 07/20/21 1244     Diastasis Recti 3 fingers above umbilicus , noted restricted C-section scars    External Perineal Exam through clothing, overuse of ab/ gluts/ adduction with cue for contraction , tight pelvic floor mm posterior, plan to assess anterior pelvic floor mm upcoming sessions              [facility] PT Treatment/Exercise - 07/20/21 1128       Neuro Re-ed    Neuro Re-ed Details  cued for body mechanics, standing,sitting, out /in to bed, standing with dumbbells, lifting and swinging kettelbells to minimize downward straining to pelvic floor, ab                       PT Long Term Goals - 07/20/21 1239       PT LONG TERM GOAL #1   Title Pt will decrease her Pelvic Pain FOTO score from 17 pts to < 12 pts in order to improve QOL    Time 10    Period Weeks    Status New    Target Date 09/28/21      PT LONG TERM GOAL #2   Title Pt will demo leveleld pelvic girdle , medial aligned coccyx, no tenderness/ tightness at L coccygeus mm across 2 visits in order to progress to deep core                    to improve IAP            system    Baseline L iliac crest higher, coccyx L deviation    Time 2    Period Weeks    Status New    Target Date 08/03/21      PT LONG TERM GOAL #3   Title Pt will demo decreased abdominal separation from 3 fingers to < 1 fingers above umbilicus in order to improve pelvic girdle stability and minimze tight pelvic floor to tolerate pelvic exams and sexual intercourse    Time 6    Period Weeks    Status New    Target Date 08/31/21      PT LONG TERM GOAL #4   Title Pt will IND with proper mechanics and alignment in  body mechanics, workout routine to minimize straining ab and pelvic floor  and exercise with less risk of injuries    Time 8    Period Weeks    Status New     Target Date 09/14/21      PT LONG TERM GOAL #5   Title Pt will demo decreased C section scar restrictions to promote more fascial mobility for deep core coactivation for pelvic function    Time 8    Period Weeks    Status New    Target Date 09/14/21                Clinical Impression Statement  Pt is a 50  yo  who presents with urinary frequency, pelvic pain, and CLBP which impacts her ADLs and QOL.   Pt's musculoskeletal assessment revealed higher L iliac crest, deviated coccyx with tenderness at coccyx/ sacrum L, gait deviations, diastasis recti, tight pelvic floor mm, restricted C-section scars,  dyscoordination and strength of pelvic floor mm, and  poor body mechanics in against gravity movements and with fitness exercises which place strain on the abdominal/pelvic floor mm.   These are deficits that indicate an ineffective intraabdominal pressure system associated with increased risk for pt's Sx. Contributing factors being considered in customizing her POC: works as a Therapist, sports standing for long hours, previous performance of sit-ups and crunches (straining abdomen/ pelvic floor ), 3  C-sections with LBP after epidural with 2nd delivery.     Pt performs many gravity-loaded tasks at work and home. Pt will benefit from proper coordination training and education on fitness and functional positions in order to yield greater outcomes as pt performed sit-ups/ crunches in the past. Advised pt to not perform sit-ups and crunches as these movement patterns lead to more downward forces on the pelvic floor, negatively impacting abdominopelvic/spinal dysfunctions.   Pt was provided education on etiology of Sx with anatomy, physiology explanation with images along with the benefits of customized pelvic PT Tx based on pt's medical conditions and musculoskeletal deficits.  Explained the physiology of deep core mm coordination and roles of pelvic floor function in urination, defecation, sexual function,  and postural control with deep core mm system.   Following Tx today which pt tolerated without complaints, pt demo'd equal alignment of pelvic girdle, no pain at lowback / SIJ , decreased posteriorpelvic floor mm, more medially aligned coccyx, and more mobile L SIJ. Pt benefits from skilled PT.   Examination-Activity Limitations Lift;Toileting;Bend;Bed Mobility  Examination-Participation Restrictions --  Stability/Clinical Decision Making Evolving/Moderate complexity  Clinical Decision Making Moderate  Rehab Potential Good  PT Frequency 1x / week  PT Duration Other (comment)    10  PT Treatment/Interventions ADLs/Self Care Home Management;Cryotherapy;Electrical Stimulation;Moist Heat;Traction;Therapeutic exercise;Therapeutic activities;Functional mobility training;Patient/family education;Gait training;DME Instruction;Balance training;Neuromuscular re-education;Manual techniques;Passive range of motion;Dry needling;Joint Manipulations;Stair training;Splinting;Taping;Scar mobilization  Consulted and Agree with Plan of Care Patient         Patient will benefit from skilled therapeutic intervention in order to improve the following deficits and impairments:  Decreased endurance, Hypomobility, Decreased activity tolerance, Decreased strength, Pain, Improper body mechanics, Decreased coordination, Increased fascial restricitons, Impaired flexibility, Postural dysfunction, Decreased range of motion, Abnormal gait, Decreased balance, Difficulty walking, Decreased mobility, Decreased scar mobility, Increased muscle spasms, Decreased safety awareness, Hypermobility, Impaired sensation, Impaired tone, Impaired UE functional use         Visit Diagnosis: Sacrococcygeal disorders, not elsewhere classified  Other lack of coordination  Chronic bilateral low back pain with left-sided sciatica  Diastasis recti  Coccydynia     Problem List Patient Active Problem List   Diagnosis Date Noted    Post-menopausal bleeding 02/14/2021   Pelvic cramping 02/14/2021   Sebaceous cyst 08/16/2020   Postcoital UTI 05/09/2020   Skin lesion of back 05/09/2020   Bilateral foot pain 08/16/2019   Hot flashes 08/16/2019   SI (sacroiliac) joint dysfunction 09/01/2017   Sciatica of left side 09/01/2017   Osteoarthritis of both knees 08/07/2016   Gastroesophageal reflux disease 08/07/2016   Palpitations 07/27/2014    Jerl Mina, PT 07/20/2021, 12:48 PM  [place] MAIN [facility] SERVICES [address] [place], [place], [address] Phone: [phone]   Fax:  [phone]  Name: Jennifer Ware MRN: [id number] Date of Birth: August 14, 1970

## 2021-07-23 ENCOUNTER — Ambulatory Visit: Payer: No Typology Code available for payment source | Admitting: Physical Therapy

## 2021-07-23 ENCOUNTER — Other Ambulatory Visit: Payer: Self-pay

## 2021-07-23 DIAGNOSIS — G8929 Other chronic pain: Secondary | ICD-10-CM

## 2021-07-23 DIAGNOSIS — M6208 Separation of muscle (nontraumatic), other site: Secondary | ICD-10-CM

## 2021-07-23 DIAGNOSIS — M533 Sacrococcygeal disorders, not elsewhere classified: Secondary | ICD-10-CM

## 2021-07-23 DIAGNOSIS — R278 Other lack of coordination: Secondary | ICD-10-CM

## 2021-07-23 NOTE — Patient Instructions (Addendum)
   Open book (handout)  Lying on  _ side , rotating  __ only this week  Rotating onto pillow /yoga block  Pillow/ Block between knees  10 reps   __  Abdominal massage upward from L,  R , center to belly button 3 stroke x 3 , pressure is gentle and light with all fingers flat, not using finger tips   __   Avoid straining pelvic floor, abdominal muscles , spine  Use log rolling technique instead of getting out of bed with your neck or the sit-up     Log rolling into and out of bed   Log rolling into and out of bed If getting out of bed on R side, Bent knees, scoot hips/ shoulder to L  Raise R arm completely overhead, rolling onto armpit  Then lower bent knees to bed to get into complete side lying position  Then drop legs off bed, and push up onto R elbow/forearm, and use L hand to push onto the bed

## 2021-07-23 NOTE — Therapy (Addendum)
[place] MAIN [facility] SERVICES [address] [place], [place], [address] Phone: [phone]   Fax:  [phone]  Physical Therapy Treatment  Patient Details  Name: Jennifer Ware MRN: [id number] Date of Birth: 1970/08/30 Referring Provider (PT): Einar Pheasant MD   Encounter Date: 07/23/2021   PT End of Session - 07/23/21 0856     Visit Number 2    Number of Visits 10    Date for PT Re-Evaluation 09/28/21    PT Start Time 0800    PT Stop Time 0900    PT Time Calculation (min) 60 min    Activity Tolerance Patient tolerated treatment well;No increased pain    Behavior During Therapy [facility] for tasks assessed/performed             Past Medical History:  Diagnosis Date   Abdominal pain 10/29/2011   Amenorrhea 10/22/2011   Anxiety    Chronic kidney disease    chronic UTI    Chronic UTI 12/24/2011   Dyspareunia 04/19/2014   Ear discomfort 07/18/2011   GERD (gastroesophageal reflux disease)    HTN (hypertension) 04/25/2014   Hx gestational diabetes    Irregular periods/menstrual cycles 03/04/2014   Knee crepitus 07/27/2014   Low back pain 06/07/2011   Osteoarthritis of both knees 08/07/2016   Otitis externa 06/07/2011   Palpitations    Sciatica of left side 09/01/2017   Sensation of feeling cold 07/27/2014   SI (sacroiliac) joint dysfunction 09/01/2017   Thyroid disease    Viral URI with cough 09/07/2013    Past Surgical History:  Procedure Laterality Date   BREAST BIOPSY Right 08/24/2014   negative, u/s core   CESAREAN SECTION     x3   HERNIA REPAIR     umbilical   UMBILICAL HERNIA REPAIR      There were no vitals filed for this visit.   Subjective Assessment - 07/23/21 0810     Subjective Pt felt sore after last session and has been focused on not standing on one leg/ crossing thigh. Pt noticed she has not had any of her LBP since last session.    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    Limitations Walking;Standing    Patient Stated Goals less pelvic and LBP, have sex with no difficulty                [facility] PT Assessment - 07/23/21 0858       Coordination   Coordination and Movement Description abdominal straining with deep core coordination, limited diaphragmatic excursion      Palpation   Spinal mobility tight paraspinals, lower intercostals limited in excursion    Palpation comment restricted C section scars                        Pelvic Floor Special Questions - 07/23/21 0859     Diastasis Recti PreTx: 3 fingers width below sternum, post Tx: more closure               [facility] Adult PT Treatment/Exercise - 07/23/21 8338       Neuro Re-ed    Neuro Re-ed Details  cued for thoracic stretches and C section self massage      Modalities   Modalities Moist Heat      Moist Heat Therapy   Number Minutes Moist Heat 5 Minutes  Moist Heat Location --   abdomen     Manual Therapy   Manual therapy comments STM/MWM at problem area noted inn assessment to promote diaphragmatic excursion, fascial mobilization at C section scars with MWM                      PT Long Term Goals - 07/20/21 1239       PT LONG TERM GOAL #1   Title Pt will decrease her Pelvic Pain FOTO score from 17 pts to < 12 pts in order to improve QOL    Time 10    Period Weeks    Status New    Target Date 09/28/21      PT LONG TERM GOAL #2   Title Pt will demo leveleld pelvic girdle , medial aligned coccyx, no tenderness/ tightness at L coccygeus mm across 2 visits in order to progress to deep core                    to improve IAP            system    Baseline L iliac crest higher, coccyx L deviation    Time 2    Period Weeks    Status New    Target Date 08/03/21      PT LONG TERM GOAL #3   Title Pt will demo decreased abdominal separation from 3 fingers to < 1 fingers above umbilicus in  order to improve pelvic girdle stability and minimze tight pelvic floor to tolerate pelvic exams and sexual intercourse    Time 6    Period Weeks    Status New    Target Date 08/31/21      PT LONG TERM GOAL #4   Title Pt will IND with proper mechanics and alignment in body mechanics, workout routine to minimize straining ab and pelvic floor  and exercise with less risk of injuries    Time 8    Period Weeks    Status New    Target Date 09/14/21      PT LONG TERM GOAL #5   Title Pt will demo decreased C section scar restrictions to promote more fascial mobility for deep core coactivation for pelvic function    Time 8    Period Weeks    Status New    Target Date 09/14/21                   Plan - 07/23/21 0856     Clinical Impression Statement Pt demo'd levelled pelvic girdle which is good carry over from last session. This corresponds with her report of no LBP since last session.  Addressed limited diaphragmatic excursion with manual Tx. Pt tolerated without complaints. Pt also required manual Tx to minmize C-section scar restrictions. Following Tx, pt demo'd mobility in these areas and showed improved closure at linea alba ( DRA).  Pt requried cues for proper diaphragm coordination without ab overuse.  Plan to progress to deep core HEP next session.  Pt benefits from skilled PT   Examination-Activity Limitations Lift;Toileting;Bend;Bed Mobility    Stability/Clinical Decision Making Evolving/Moderate complexity    Rehab Potential Good    PT Frequency 1x / week    PT Duration Other (comment)   10   PT Treatment/Interventions ADLs/Self Care Home Management;Cryotherapy;Electrical Stimulation;Moist Heat;Traction;Therapeutic exercise;Therapeutic activities;Functional mobility training;Patient/family education;Gait training;DME Instruction;Balance training;Neuromuscular re-education;Manual techniques;Passive range of motion;Dry needling;Joint Manipulations;Stair  training;Splinting;Taping;Scar mobilization    Consulted  and Agree with Plan of Care Patient             Patient will benefit from skilled therapeutic intervention in order to improve the following deficits and impairments:  Decreased endurance, Hypomobility, Decreased activity tolerance, Decreased strength, Pain, Improper body mechanics, Decreased coordination, Increased fascial restricitons, Impaired flexibility, Postural dysfunction, Decreased range of motion, Abnormal gait, Decreased balance, Difficulty walking, Decreased mobility, Decreased scar mobility, Increased muscle spasms, Decreased safety awareness, Hypermobility, Impaired sensation, Impaired tone, Impaired UE functional use  Visit Diagnosis: Sacrococcygeal disorders, not elsewhere classified  Chronic bilateral low back pain with left-sided sciatica  Other lack of coordination  Diastasis recti  Coccydynia  Chronic midline low back pain with left-sided sciatica     Problem List Patient Active Problem List   Diagnosis Date Noted   Post-menopausal bleeding 02/14/2021   Pelvic cramping 02/14/2021   Sebaceous cyst 08/16/2020   Postcoital UTI 05/09/2020   Skin lesion of back 05/09/2020   Bilateral foot pain 08/16/2019   Hot flashes 08/16/2019   SI (sacroiliac) joint dysfunction 09/01/2017   Sciatica of left side 09/01/2017   Osteoarthritis of both knees 08/07/2016   Gastroesophageal reflux disease 08/07/2016   Palpitations 07/27/2014    Jerl Mina, PT 07/23/2021, 9:50 AM  [place], [place], [address] Phone: [phone]   Fax:  [phone]  Name: Brandon Scarbrough MRN: [id number] Date of Birth: 03/09/1971

## 2021-07-24 ENCOUNTER — Ambulatory Visit
Admission: RE | Admit: 2021-07-24 | Discharge: 2021-07-24 | Disposition: A | Discharge status: Home / Self Care | Payer: No Typology Code available for payment source | Source: Ambulatory Visit | Attending: Family Medicine | Admitting: Family Medicine

## 2021-07-24 DIAGNOSIS — Z1231 Encounter for screening mammogram for malignant neoplasm of breast: Secondary | ICD-10-CM | POA: Diagnosis not present

## 2021-07-26 ENCOUNTER — Ambulatory Visit: Payer: No Typology Code available for payment source | Admitting: Dermatology

## 2021-07-26 ENCOUNTER — Other Ambulatory Visit: Payer: Self-pay

## 2021-07-26 ENCOUNTER — Encounter: Payer: Self-pay | Admitting: Dermatology

## 2021-07-26 ENCOUNTER — Encounter: Payer: Self-pay | Admitting: Family Medicine

## 2021-07-26 DIAGNOSIS — L700 Acne vulgaris: Secondary | ICD-10-CM

## 2021-07-26 MED ORDER — TRETINOIN 0.05 % EX CREA
TOPICAL_CREAM | Freq: Every day | CUTANEOUS | 5 refills | Status: AC
  Filled 2021-07-26: qty 45, 30d supply, fill #0
  Filled 2022-05-06 – 2022-05-16 (×3): qty 45, 30d supply, fill #1

## 2021-07-26 MED ORDER — CLINDAMYCIN PHOSPHATE 1 % EX SWAB
1.0000 "application " | Freq: Every morning | CUTANEOUS | 5 refills | Status: DC
  Filled 2021-07-26: qty 60, 60d supply, fill #0
  Filled 2022-07-10: qty 60, 60d supply, fill #1

## 2021-07-26 NOTE — Patient Instructions (Addendum)
Continue clindamycin once daily. Will send in pads per patient's request to see if covered with insurance.  Continue tretinoin increasing to 0.05% cream nightly as tolerated. Continue hypochlorous spray daily Start sample of Amzeeq, small amount to entire face after applying the tretinoin at bedtime.   Topical retinoid medications like tretinoin/Retin-A, adapalene/Differin, tazarotene/Fabior, and Epiduo/Epiduo Forte can cause dryness and irritation when first started. Only apply a pea-sized amount to the entire affected area. Avoid applying it around the eyes, edges of mouth and creases at the nose. If you experience irritation, use a good moisturizer first and/or apply the medicine less often. If you are doing well with the medicine, you can increase how often you use it until you are applying every night. Be careful with sun protection while using this medication as it can make you sensitive to the sun. This medicine should not be used by pregnant women.   If You Need Anything After Your Visit  If you have any questions or concerns for your doctor, please call our main line at [phone] and press option 4 to reach your doctor's medical assistant. If no one answers, please leave a voicemail as directed and we will return your call as soon as possible. Messages left after 4 pm will be answered the following business day.   You may also send [place] a message via [place]. We typically respond to MyChart messages within 1-2 business days.  For prescription refills, please ask your pharmacy to contact our office. Our fax number is [phone].  If you have an urgent issue when the clinic is closed that cannot wait until the next business day, you can page your doctor at the number below.    Please note that while we do our best to be available for urgent issues outside of office hours, we are not available 24/7.   If you have an urgent issue and are unable to reach [place], you may choose to seek medical  care at your doctor's office, retail clinic, urgent care center, or emergency room.  If you have a medical emergency, please immediately call 911 or go to the emergency department.  Pager Numbers  - Dr. Nehemiah Massed: [phone]  - Dr. Laurence Ferrari: [phone]  - Dr. Nicole Kindred: [phone]  In the event of inclement weather, please call our main line at [phone] for an update on the status of any delays or closures.  Dermatology Medication Tips: Please keep the boxes that topical medications come in in order to help keep track of the instructions about where and how to use these. Pharmacies typically print the medication instructions only on the boxes and not directly on the medication tubes.   If your medication is too expensive, please contact our office at [phone] option 4 or send [place] a message through [place].   We are unable to tell what your co-pay for medications will be in advance as this is different depending on your insurance coverage. However, we may be able to find a substitute medication at lower cost or fill out paperwork to get insurance to cover a needed medication.   If a prior authorization is required to get your medication covered by your insurance company, please allow [place] 1-2 business days to complete this process.  Drug prices often vary depending on where the prescription is filled and some pharmacies may offer cheaper prices.  The website www.goodrx.com contains coupons for medications through different pharmacies. The prices here do not account for what the cost may be with help  from insurance (it may be cheaper with your insurance), but the website can give you the price if you did not use any insurance.  - You can print the associated coupon and take it with your prescription to the pharmacy.  - You may also stop by our office during regular business hours and pick up a GoodRx coupon card.  - If you need your prescription sent electronically to a different  pharmacy, notify our office through [facility] or by phone at [phone] option 4.     Si Usted Necesita Algo Despus de Su Visita  Tambin puede enviarnos un mensaje a travs de Pharmacist, community. Por lo general respondemos a los mensajes de MyChart en el transcurso de 1 a 2 das hbiles.  Para renovar recetas, por favor pida a su farmacia que se ponga en contacto con nuestra oficina. Harland Dingwall de fax es [place] [phone].  Si tiene un asunto urgente cuando la clnica est cerrada y que no puede esperar hasta el siguiente da hbil, puede llamar/localizar a su doctor(a) al nmero que aparece a continuacin.   Por favor, tenga en cuenta que aunque hacemos todo lo posible para estar disponibles para asuntos urgentes fuera del horario de [place], no estamos disponibles las 24 horas del da, los 7 das de la [place].   Si tiene un problema urgente y no puede comunicarse con nosotros, puede optar por buscar atencin mdica  en el consultorio de su doctor(a), en una clnica privada, en un centro de atencin urgente o en una sala de emergencias.  Si tiene Engineering geologist, por favor llame inmediatamente al 911 o vaya a la sala de emergencias.  Nmeros de bper  - Dr. Nehemiah Massed: [phone]  - Dra. Moye: [phone]  - Dra. Nicole Kindred: [phone]  En caso de inclemencias del [place], por favor llame a Johnsie Kindred principal al [phone] para una actualizacin sobre el [place] de cualquier retraso o cierre.  Consejos para la medicacin en dermatologa: Por favor, guarde las cajas en las que vienen los medicamentos de uso tpico para ayudarle a seguir las instrucciones sobre dnde y cmo usarlos. Las farmacias generalmente imprimen las instrucciones del medicamento slo en las cajas y no directamente en los tubos del [place].   Si su medicamento es muy caro, por favor, pngase en contacto con Zigmund Daniel llamando al [phone] y presione la opcin 4 o envenos un mensaje a  travs de Pharmacist, community.   No podemos decirle cul ser su copago por los medicamentos por adelantado ya que esto es diferente dependiendo de la cobertura de su seguro. Sin embargo, es posible que podamos encontrar un medicamento sustituto a Electrical engineer un formulario para que el seguro cubra el medicamento que se considera necesario.   Si se requiere una autorizacin previa para que su compaa de seguros [place] su medicamento, por favor permtanos de 1 a 2 das hbiles para completar este proceso.  Los precios de los medicamentos varan con frecuencia dependiendo del Environmental consultant de dnde se surte la receta y alguna farmacias pueden ofrecer precios ms baratos.  El sitio web www.goodrx.com tiene cupones para medicamentos de Airline pilot. Los precios aqu no tienen en cuenta lo que podra costar con la ayuda del seguro (puede ser ms barato con su seguro), pero el sitio web puede darle el precio si no utiliz Research scientist (physical sciences).  - Puede imprimir el cupn correspondiente y llevarlo con su receta a la farmacia.  - Tambin puede pasar por nuestra oficina durante el horario  de atencin regular y Charity fundraiser una tarjeta de cupones de GoodRx.  - Si necesita que su receta se enve electrnicamente a una farmacia diferente, informe a nuestra oficina a travs de MyChart de [place] o por telfono llamando al [phone] y presione la opcin 4.

## 2021-07-26 NOTE — Progress Notes (Signed)
° °  Follow-Up Visit   Subjective  Jennifer Ware is a 50 y.o. female who presents for the following: Acne (Patient here today for acne follow up. Patient currently using clindamycin solution, tretinoin 0.025% and hypochlorous spray daily. She advises acne is doing well. ). She is slightly bothered by the occasional breakout.  Patient would like to switch from clindamycin solution to the pads.   The following portions of the chart were reviewed this encounter and updated as appropriate:   Tobacco   Allergies   Meds   Problems   Med Hx   Surg Hx   Fam Hx       Review of Systems:  No other skin or systemic complaints except as noted in HPI or Assessment and Plan.  Objective  Well appearing patient in no apparent distress; mood and affect are within normal limits.  A focused examination was performed including face, neck, chest and back. Relevant physical exam findings are noted in the Assessment and Plan.  face Trace open comedones, single inflammatory papule at right jaw Chest and back clear   Assessment & Plan  Acne vulgaris face  Chronic condition with duration over one year. Condition is bothersome to patient. Not currently at goal.  Continue clindamycin once daily. Will send in pads per patient's request to see if covered with insurance.   Continue tretinoin increasing to 0.05% cream nightly as tolerated. Continue hypochlorous spray daily  Start sample of Amzeeq, small amount to entire face after applying the tretinoin at bedtime. Patient will call if she would like rx sent in to [facility].   Topical retinoid medications like tretinoin/Retin-A, adapalene/Differin, tazarotene/Fabior, and Epiduo/Epiduo Forte can cause dryness and irritation when first started. Only apply a pea-sized amount to the entire affected area. Avoid applying it around the eyes, edges of mouth and creases at the nose. If you experience irritation, use a good moisturizer first and/or apply the medicine less  often. If you are doing well with the medicine, you can increase how often you use it until you are applying every night. Be careful with sun protection while using this medication as it can make you sensitive to the sun. This medicine should not be used by pregnant women.    tretinoin (RETIN-A) 0.05 % cream - face Apply topically at bedtime.  clindamycin (CLEOCIN T) 1 % SWAB - face Apply 1 application topically in the morning.  Related Medications clindamycin (CLEOCIN T) 1 % external solution APPLY TO FACE 1 TO 2 TIMES DAILY AS NEEDED FOR ACNE  Return in about 1 year (around 07/26/2022) for Acne.  Graciella Belton, RMA, am acting as scribe for Forest Gleason, MD .  Documentation: I have reviewed the above documentation for accuracy and completeness, and I agree with the above.  Forest Gleason, MD

## 2021-07-27 ENCOUNTER — Other Ambulatory Visit: Payer: Self-pay

## 2021-07-30 ENCOUNTER — Encounter: Payer: No Typology Code available for payment source | Admitting: Physical Therapy

## 2021-07-31 ENCOUNTER — Other Ambulatory Visit: Payer: Self-pay

## 2021-07-31 NOTE — Progress Notes (Signed)
08/01/21 1:36 PM   Jennifer Ware 06-28-1971 [id number]  Referring provider:  Lesleigh Noe, MD [place],  [place] [address] Chief Complaint  Patient presents with   Urinary Frequency     HPI: Jennifer Ware is a 50 y.o.female with a personal history of frequent urination and dyspareunia, who presents today for 3 month follow-up.   She has a history of postcoital urinary tract infections.  She was prescribed Macrobid to take prophylactically at the time of intercourse which initially helped.  Over the past several years however this quit working.   She followed up with her OB/GYN for further evaluation on perimenopausal bleeding. Dr. Waunita Schooner discussed greatest concern being cancer but could also be fibroid/polyp. She underwent biopsy for post-menopausal bleeding which was negative.   She was seen in clinic on 03/28/2021 for a cystoscopy. Cystoscopy showed extrinsic posterior bladder wall compression possibly representing uterine fibroids or other extrinsic bladder mass.   Since her last visit she has been working with PT she has had two visits.   She reports today that her urinary symptoms are stable. She experiences urinary frequency more after sexual intercourse.  She also emergency observation symptoms with spicy foods and dietary indiscretion.  She reports that physical therapy has been going well but it has been hard to get an appointment. She reached out to her OB-GYN regarding vaginal estrogen cream but her question ws not necessarily answered, she is interested in trying this.   She continues to have discomfort with intercourse.  She has been voiding and pretty much altogether.  She tried vaginal Valium twice but this "knocked her out" and was difficult to plan for intercourse.  PMH: Past Medical History:  Diagnosis Date   Abdominal pain 10/29/2011   Amenorrhea 10/22/2011   Anxiety    Chronic kidney disease    chronic UTI    Chronic UTI 12/24/2011    Dyspareunia 04/19/2014   Ear discomfort 07/18/2011   GERD (gastroesophageal reflux disease)    HTN (hypertension) 04/25/2014   Hx gestational diabetes    Irregular periods/menstrual cycles 03/04/2014   Knee crepitus 07/27/2014   Low back pain 06/07/2011   Osteoarthritis of both knees 08/07/2016   Otitis externa 06/07/2011   Palpitations    Sciatica of left side 09/01/2017   Sensation of feeling cold 07/27/2014   SI (sacroiliac) joint dysfunction 09/01/2017   Thyroid disease    Viral URI with cough 09/07/2013    Surgical History: Past Surgical History:  Procedure Laterality Date   BREAST BIOPSY Right 08/24/2014   negative, u/s core   CESAREAN SECTION     x3   HERNIA REPAIR     umbilical   UMBILICAL HERNIA REPAIR      Home Medications:  Allergies as of 08/01/2021       Reactions   Flagyl [metronidazole]    Meloxicam    Silver Sulfadiazine    Sulfa Antibiotics         Medication List        Accurate as of August 01, 2021  1:36 PM. If you have any questions, ask your nurse or doctor.          STOP taking these medications    oxyCODONE-acetaminophen 5-325 MG tablet Commonly known as: PERCOCET/ROXICET Stopped by: Hollice Espy, MD       TAKE these medications    AMBULATORY NON FORMULARY MEDICATION Thigh-high, medium compression, graduated compression stockings. Apply to lower extremities. Www.Dreamproducts.com, Zippered Compression Stockings,  medium circ, long length   ANTIOXIDANT PO Take by mouth daily.   BIOTIN 5000 PO Take by mouth daily.   Calcium 600-200 MG-UNIT tablet Take 2 tablets by mouth daily.   clindamycin 1 % Swab Commonly known as: CLEOCIN T Apply to the affected area(s) once daily in the morning (Apply 1 application topically in the morning.) What changed: Another medication with the same name was removed. Continue taking this medication, and follow the directions you see here. Changed by: Hollice Espy, MD   Cranberry 1000 MG  Caps Take 500 mg by mouth as needed.   cyanocobalamin 1000 MCG tablet Take 100 mcg by mouth daily.   diazepam 10 MG tablet Commonly known as: Valium Insert 1 tablet in the vagina one hour prior to intercourse. Do not use this medication by mouth. Please do not drive or use machinery after medication administration. (Take 1 tablet (10 mg total) by mouth as needed (Prior to intercourse). Do not use this medication orally.  Please 1 tablet in the vagina 1 hour prior to intercourse.  Please do not drive or use machinery after medication administration.)   Fluocinolone Acetonide 0.01 % Oil PLACE 2 DROPS TO AFFECTED EAR, NO MORE THAN 1 WEEK AT A TIME   Ginkgo Biloba 40 MG Tabs Take 1 tablet by mouth daily.   glucosamine-chondroitin 500-400 MG tablet Take 1 tablet by mouth 2 (two) times daily.   Lecithin 1200 MG Caps Take 1 capsule by mouth as needed.   misoprostol 200 MCG tablet Commonly known as: CYTOTEC Take 2 tablets (400 mcg total) by mouth once for 1 dose.   oxybutynin 5 MG tablet Commonly known as: DITROPAN Take 1 tablet (5 mg total) by mouth 3 (three) times daily as needed for bladder spasms.   Pennsaid 2 % Soln Generic drug: Diclofenac Sodium Apply 1 application topically daily as needed.   phenazopyridine 95 MG tablet Commonly known as: PYRIDIUM Take 1 tablet (95 mg total) by mouth 3 (three) times daily as needed for pain.   tretinoin 0.05 % cream Commonly known as: RETIN-A Apply topically at bedtime.   vitamin C 1000 MG tablet Take 1,000 mg by mouth daily.        Allergies:  Allergies  Allergen Reactions   Flagyl [Metronidazole]    Meloxicam    Silver Sulfadiazine    Sulfa Antibiotics     Family History: Family History  Problem Relation Age of Onset   Asthma Father    Colon cancer Neg Hx    Breast cancer Neg Hx    Colon polyps Neg Hx    Esophageal cancer Neg Hx    Rectal cancer Neg Hx    Stomach cancer Neg Hx     Social History:  reports that  she has never smoked. She has never used smokeless tobacco. She reports current alcohol use. She reports that she does not use drugs.   Physical Exam: LMP 09/04/2018   Constitutional:  Alert and oriented, No acute distress. HEENT: [place] AT, moist mucus membranes.  Trachea midline, no masses. Cardiovascular: No clubbing, cyanosis, or edema. Respiratory: Normal respiratory effort, no increased work of breathing. Skin: No rashes, bruises or suspicious lesions. Neurologic: Grossly intact, no focal deficits, moving all 4 extremities. Psychiatric: Normal mood and affect.  Laboratory Data:  Lab Results  Component Value Date   CREATININE 0.89 08/16/2020   Lab Results  Component Value Date   HGBA1C 5.4 08/16/2020   Assessment & Plan:    Frequent urination -  We discussed differential diagnosis such as IC, we discussed the diease pathophysiology is poorly understood therefore treatment has been focused primarily on symptomatic relief as well as dietary and behavioral modification. - I think that she would likely benefit from topical estrogen cream per urethra and vagina given that her symptoms seem to be exacerbated with the postmenopausal/perimenopausal state.  We discussed the risks including fairly low systemic absorption.  She is more interested in trying this than she was in previous visit.  - Vaginal estrogen cream prescribed today. -You to use oxybutynin as needed  2. Dyspareunia  - Recommend she continue physical therapy  - Continue to follow-up with OB-GYN  -Vaginal/periurethral estrogen as above  Return in 6 months for symptom recheck  I,Kailey Littlejohn,acting as a scribe for Hollice Espy, MD.,have documented all relevant documentation on the behalf of Hollice Espy, MD,as directed by  Hollice Espy, MD while in the presence of Hollice Espy, MD.  I have reviewed the above documentation for accuracy and completeness, and I agree with the above.   Hollice Espy,  MD  [facility] [address] [place], [place] [address] [phone]

## 2021-08-01 ENCOUNTER — Ambulatory Visit (INDEPENDENT_AMBULATORY_CARE_PROVIDER_SITE_OTHER): Payer: No Typology Code available for payment source | Admitting: Urology

## 2021-08-01 ENCOUNTER — Other Ambulatory Visit: Payer: Self-pay

## 2021-08-01 VITALS — BP 122/84 | HR 64 | Ht 63.0 in | Wt 154.0 lb

## 2021-08-01 DIAGNOSIS — R350 Frequency of micturition: Secondary | ICD-10-CM | POA: Diagnosis not present

## 2021-08-01 DIAGNOSIS — N941 Unspecified dyspareunia: Secondary | ICD-10-CM | POA: Diagnosis not present

## 2021-08-01 DIAGNOSIS — R102 Pelvic and perineal pain: Secondary | ICD-10-CM

## 2021-08-01 MED ORDER — ESTRADIOL 0.1 MG/GM VA CREA
TOPICAL_CREAM | VAGINAL | 5 refills | Status: DC
  Filled 2021-08-01: qty 42.5, 90d supply, fill #0
  Filled 2021-09-17: qty 42.5, 30d supply, fill #0

## 2021-08-01 NOTE — Patient Instructions (Signed)
Interstitial Cystitis °Interstitial cystitis is inflammation of the bladder. This condition is also known as painful bladder syndrome. This may cause pain in the bladder area as well as a frequent and urgent need to urinate. The bladder is an organ that stores urine after the urine is made in the kidneys. °The severity of interstitial cystitis can vary from person to person. You may have flare-ups, and then your symptoms may go away for a while. For many people, it becomes a long-term (chronic) problem. °What are the causes? °The cause of this condition is not known. °What increases the risk? °The following factors may make you more likely to develop this condition: °Being female. °Having fibromyalgia. °Having irritable bowel syndrome (IBS). °Having endometriosis. °Having chronic fatigue syndrome. °This condition may be aggravated by: °Stress. °Smoking. °Spicy foods. °What are the signs or symptoms? °Symptoms of interstitial cystitis vary, and they can change over time. Symptoms may include: °Discomfort or pain in the bladder area, which is in the lower abdomen. Pain can range from mild to severe. The pain may change in intensity as the bladder fills with urine or as it empties. °Pain in the pelvic area, between the hip bones. °A constant urge to urinate. °Frequent urination. °Pain during urination. °Pain during sex. °Blood in the urine. °Feeling tired (fatigue). °For women, symptoms often get worse during menstruation. °How is this diagnosed? °This condition is diagnosed based on your symptoms, your medical history, and a physical exam. Your health care provider may need to rule out other conditions and may order other tests, such as: °Urine tests. °Cystoscopy. For this test, a tool similar to a very thin telescope is used to look into your bladder. °Biopsy. This involves taking a sample of tissue from the bladder to be examined under a microscope. °How is this treated? °There is no cure for this condition, but  treatment can help you control your symptoms. Work closely with your health care provider to find the most effective treatments for you. Treatment options may include: °Medicines to relieve pain and reduce how often you feel the need to urinate. This treatment may include: °A procedure where a small amount of medicine that eases irritation is put inside your bladder through a catheter (bladder instillation). °Lifestyle changes, such as changing your diet or taking steps to control stress. °Physical therapy. This may include: °Exercises to help relax the pelvic floor muscles. °Massage to relax tight muscles (myofascial release). °Learning ways to control when you urinate (bladder training). °Using a device that provides electrical stimulation to your nerves, which can relieve pain (neuromodulation therapy). The device is placed on your back, where it blocks the nerves that cause you to feel pain in your bladder area. °A procedure that stretches your bladder by filling it with air or fluid (hydrodistention). °Surgery. This is rare. It is only done for extreme cases, if other treatments do not help. °Follow these instructions at home: °Lifestyle °Learn and practice relaxation techniques, such as deep breathing and muscle relaxation. °Get care for your body and mental well-being, such as: °Cognitive behavioral therapy (CBT). This therapy changes the way you think or act in response to different situations. This may improve how you feel. °Seeing a mental health therapist to evaluate and treat depression, if necessary. °Work with your health care provider on other ways to manage pain. Acupuncture may be helpful. °Avoid drinking alcohol. °Do not use any products that contain nicotine or tobacco. These products include cigarettes, chewing tobacco, and vaping devices, such as   e-cigarettes. If you need help quitting, ask your health care provider. °Eating and drinking °Make dietary changes as recommended by your health care  provider. You may need to avoid: °Spicy foods. °Foods that contain a lot of potassium. °Limit your intake of drinks that increase your urge to urinate. These include alcohol and caffeinated drinks like soda, coffee, and tea. °Bladder training ° °Use bladder training techniques as directed. Techniques may include: °Urinating at scheduled times. °Training yourself to delay urination. °Keep a bladder diary. °Write down the times you urinate and any symptoms that you have. This can help you find out which foods, liquids, or activities make your symptoms worse. °Use your bladder diary to schedule bathroom trips. If you are away from home, plan to be near a bathroom at each of your scheduled times. °Make sure that you urinate just before you leave the house and just before you go to bed. °General instructions °Take over-the-counter and prescription medicines only as told by your health care provider. °Try a warm or cool compress over your bladder for comfort. °Avoid wearing tight clothing. °Do exercises to relax your pelvic floor muscles as told by your physical therapist. °Keep all follow-up visits. This is important. °Where to find more information °To find more information or a support group near you, visit: °[employer] Foundation: urologyhealth.org °Interstitial Cystitis Association: ichelp.org °Contact a health care provider if you have: °Symptoms that do not get better with treatment. °Pain or discomfort that gets worse. °More frequent urges to urinate. °A fever. °Get help right away if: °You have no control over when you urinate. °Summary °Interstitial cystitis is inflammation of the bladder. °This condition may cause pain in the bladder area as well as a frequent and urgent need to urinate. °You may have flare-ups of the condition, and then it may go away for a while. For many people, it becomes a long-term (chronic) problem. °There is no cure for interstitial cystitis, but treatment methods are available to  control your symptoms. °This information is not intended to replace advice given to you by your health care provider. Make sure you discuss any questions you have with your health care provider. °Document Revised: 03/03/2020 Document Reviewed: 03/03/2020 °Elsevier Patient Education © 2022 Elsevier Inc. ° °

## 2021-08-03 ENCOUNTER — Other Ambulatory Visit: Payer: Self-pay

## 2021-08-03 ENCOUNTER — Ambulatory Visit: Payer: No Typology Code available for payment source | Admitting: Physical Therapy

## 2021-08-03 DIAGNOSIS — M6208 Separation of muscle (nontraumatic), other site: Secondary | ICD-10-CM

## 2021-08-03 DIAGNOSIS — M533 Sacrococcygeal disorders, not elsewhere classified: Secondary | ICD-10-CM | POA: Diagnosis not present

## 2021-08-03 DIAGNOSIS — R278 Other lack of coordination: Secondary | ICD-10-CM

## 2021-08-03 DIAGNOSIS — M5442 Lumbago with sciatica, left side: Secondary | ICD-10-CM

## 2021-08-03 NOTE — Therapy (Addendum)
[place] MAIN [facility] SERVICES [address] [place], [place], [address] Phone: [phone]   Fax:  [phone]  Physical Therapy Treatment  Patient Details  Name: Jennifer Ware MRN: [id number] Date of Birth: June 19, 1971 Referring Provider (PT): Einar Pheasant MD   Encounter Date: 08/03/2021   PT End of Session - 08/03/21 1045     Visit Number 3    Number of Visits 10    Date for PT Re-Evaluation 09/28/21    PT Start Time 0900    PT Stop Time 1000    PT Time Calculation (min) 60 min    Activity Tolerance Patient tolerated treatment well;No increased pain    Behavior During Therapy [facility] for tasks assessed/performed             Past Medical History:  Diagnosis Date   Abdominal pain 10/29/2011   Amenorrhea 10/22/2011   Anxiety    Chronic kidney disease    chronic UTI    Chronic UTI 12/24/2011   Dyspareunia 04/19/2014   Ear discomfort 07/18/2011   GERD (gastroesophageal reflux disease)    HTN (hypertension) 04/25/2014   Hx gestational diabetes    Irregular periods/menstrual cycles 03/04/2014   Knee crepitus 07/27/2014   Low back pain 06/07/2011   Osteoarthritis of both knees 08/07/2016   Otitis externa 06/07/2011   Palpitations    Sciatica of left side 09/01/2017   Sensation of feeling cold 07/27/2014   SI (sacroiliac) joint dysfunction 09/01/2017   Thyroid disease    Viral URI with cough 09/07/2013    Past Surgical History:  Procedure Laterality Date   BREAST BIOPSY Right 08/24/2014   negative, u/s core   CESAREAN SECTION     x3   HERNIA REPAIR     umbilical   UMBILICAL HERNIA REPAIR      There were no vitals filed for this visit.   Subjective Assessment - 08/03/21 0905     Subjective Pt experienced no CLBP the past weeks.    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    Limitations Walking;Standing    Patient Stated Goals less  pelvic and LBP, have sex with no difficulty                [facility] PT Assessment - 08/03/21 1043       Coordination   Coordination and Movement Description excessive use of ab muscles,   poor propioception of pelvic tilts      Palpation   Palpation comment restricted C section scars  (R distal, inferior aspect of scar L to R  )                        Pelvic Floor Special Questions - 08/03/21 1039     External Perineal Exam through clothing, tightness/ tender at L ischiocavernosus/ transverse perineal > R , fascial restrictions over mons pubis                       PT Long Term Goals - 07/20/21 1239       PT LONG TERM GOAL #1   Title Pt will decrease her Pelvic Pain FOTO score from 17 pts to < 12 pts in order to improve QOL    Time 10    Period Weeks    Status New    Target Date 09/28/21  PT LONG TERM GOAL #2   Title Pt will demo leveleld pelvic girdle , medial aligned coccyx, no tenderness/ tightness at L coccygeus mm across 2 visits in order to progress to deep core                    to improve IAP            system    Baseline L iliac crest higher, coccyx L deviation    Time 2    Period Weeks    Status New    Target Date 08/03/21      PT LONG TERM GOAL #3   Title Pt will demo decreased abdominal separation from 3 fingers to < 1 fingers above umbilicus in order to improve pelvic girdle stability and minimze tight pelvic floor to tolerate pelvic exams and sexual intercourse    Time 6    Period Weeks    Status New    Target Date 08/31/21      PT LONG TERM GOAL #4   Title Pt will IND with proper mechanics and alignment in body mechanics, workout routine to minimize straining ab and pelvic floor  and exercise with less risk of injuries    Time 8    Period Weeks    Status New    Target Date 09/14/21      PT LONG TERM GOAL #5   Title Pt will demo decreased C section scar restrictions to promote more fascial mobility for deep core  coactivation for pelvic function    Time 8    Period Weeks    Status New    Target Date 09/14/21                   Plan - 08/03/21 1045     Clinical Impression Statement Pt contiued to require manual Tx to mobilize C section scars and decrease L pelvic floor mm tightness. Pt demo'd improved mobility to progress to deep core level 1-2 HEP. Pt requried cues for     pelvic tilt propioception and less use of ab mm with pelvic floor. Pt continues to benefit from skilled PT    Examination-Activity Limitations Lift;Toileting;Bend;Bed Mobility    Stability/Clinical Decision Making Evolving/Moderate complexity    Rehab Potential Good    PT Frequency 1x / week    PT Duration Other (comment)   10   PT Treatment/Interventions ADLs/Self Care Home Management;Cryotherapy;Electrical Stimulation;Moist Heat;Traction;Therapeutic exercise;Therapeutic activities;Functional mobility training;Patient/family education;Gait training;DME Instruction;Balance training;Neuromuscular re-education;Manual techniques;Passive range of motion;Dry needling;Joint Manipulations;Stair training;Splinting;Taping;Scar mobilization    Consulted and Agree with Plan of Care Patient             Patient will benefit from skilled therapeutic intervention in order to improve the following deficits and impairments:  Decreased endurance, Hypomobility, Decreased activity tolerance, Decreased strength, Pain, Improper body mechanics, Decreased coordination, Increased fascial restricitons, Impaired flexibility, Postural dysfunction, Decreased range of motion, Abnormal gait, Decreased balance, Difficulty walking, Decreased mobility, Decreased scar mobility, Increased muscle spasms, Decreased safety awareness, Hypermobility, Impaired sensation, Impaired tone, Impaired UE functional use  Visit Diagnosis: Sacrococcygeal disorders, not elsewhere classified  Chronic bilateral low back pain with left-sided sciatica  Other lack of  coordination  Diastasis recti  Coccydynia  Chronic midline low back pain with left-sided sciatica     Problem List Patient Active Problem List   Diagnosis Date Noted   Post-menopausal bleeding 02/14/2021   Pelvic cramping 02/14/2021   Sebaceous cyst 08/16/2020   Postcoital UTI  05/09/2020   Skin lesion of back 05/09/2020   Bilateral foot pain 08/16/2019   Hot flashes 08/16/2019   SI (sacroiliac) joint dysfunction 09/01/2017   Sciatica of left side 09/01/2017   Osteoarthritis of both knees 08/07/2016   Gastroesophageal reflux disease 08/07/2016   Palpitations 07/27/2014    Jerl Mina, PT 08/03/2021, 10:47 AM  [place] MAIN [facility] SERVICES [place], [place], [address] Phone: [phone]   Fax:  [phone]  Name: Arria Naim MRN: [id number] Date of Birth: [birth year]

## 2021-08-03 NOTE — Patient Instructions (Signed)
Deep core level 1 and 2  Make sure to practice:  Neutral tilt of pelvis and firm feet ( hip width apart) as the starting position  2 x day

## 2021-08-08 ENCOUNTER — Ambulatory Visit: Payer: No Typology Code available for payment source | Admitting: Physical Therapy

## 2021-08-14 ENCOUNTER — Ambulatory Visit: Payer: No Typology Code available for payment source | Attending: Family Medicine | Admitting: Physical Therapy

## 2021-08-14 ENCOUNTER — Other Ambulatory Visit: Payer: Self-pay

## 2021-08-14 DIAGNOSIS — M5442 Lumbago with sciatica, left side: Secondary | ICD-10-CM | POA: Diagnosis present

## 2021-08-14 DIAGNOSIS — M533 Sacrococcygeal disorders, not elsewhere classified: Secondary | ICD-10-CM | POA: Diagnosis present

## 2021-08-14 DIAGNOSIS — M6208 Separation of muscle (nontraumatic), other site: Secondary | ICD-10-CM | POA: Diagnosis present

## 2021-08-14 DIAGNOSIS — G8929 Other chronic pain: Secondary | ICD-10-CM | POA: Insufficient documentation

## 2021-08-14 DIAGNOSIS — R278 Other lack of coordination: Secondary | ICD-10-CM | POA: Insufficient documentation

## 2021-08-14 NOTE — Therapy (Addendum)
[place] MAIN [facility] SERVICES [address] [place], [place], [address] Phone: [phone]   Fax:  [phone]  Physical Therapy Treatment  Patient Details  Name: Jennifer Ware MRN: [id number] Date of Birth: 1971-02-22 Referring Provider (PT): Einar Pheasant MD   Encounter Date: 08/14/2021   PT End of Session - 08/14/21 0905     Visit Number 4    Number of Visits 10    Date for PT Re-Evaluation 09/28/21    PT Start Time 0815    PT Stop Time 0915    PT Time Calculation (min) 60 min    Activity Tolerance Patient tolerated treatment well;No increased pain    Behavior During Therapy [facility] for tasks assessed/performed             Past Medical History:  Diagnosis Date   Abdominal pain 10/29/2011   Amenorrhea 10/22/2011   Anxiety    Chronic kidney disease    chronic UTI    Chronic UTI 12/24/2011   Dyspareunia 04/19/2014   Ear discomfort 07/18/2011   GERD (gastroesophageal reflux disease)    HTN (hypertension) 04/25/2014   Hx gestational diabetes    Irregular periods/menstrual cycles 03/04/2014   Knee crepitus 07/27/2014   Low back pain 06/07/2011   Osteoarthritis of both knees 08/07/2016   Otitis externa 06/07/2011   Palpitations    Sciatica of left side 09/01/2017   Sensation of feeling cold 07/27/2014   SI (sacroiliac) joint dysfunction 09/01/2017   Thyroid disease    Viral URI with cough 09/07/2013    Past Surgical History:  Procedure Laterality Date   BREAST BIOPSY Right 08/24/2014   negative, u/s core   CESAREAN SECTION     x3   HERNIA REPAIR     umbilical   UMBILICAL HERNIA REPAIR      There were no vitals filed for this visit.   Subjective Assessment - 08/14/21 0900     Subjective Pt has low back pain but not as excruciating. 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    Limitations Walking;Standing    Patient Stated Goals less pelvic and LBP, have sex with no difficulty                [facility] PT Assessment - 08/14/21 0910       Coordination   Coordination and Movement Description abdominal . adductor overuse in deep core HEP, posterior tilt of pelvis      Palpation   Palpation comment tightness suprapubic area by scar,                        Pelvic Floor Special Questions - 08/14/21 0908     External Palpation tightness and tenderness at anterior triangle B    Pelvic Floor Internal Exam Pt consented verbally and  had no contraindications    Exam Type Vaginal    Palpation tightness at ischiocavernosus B , urehtra lowered behind pubic symphysis, less tenderness when cued for anterior tilt of pelvis,    Strength weak squeeze, no lift   abdominal overuse, dyscoordination of pelvic floor              [facility] Adult PT Treatment/Exercise - 08/14/21 0973       Therapeutic Activites  Other Therapeutic Activities explained the importance of deep core coordination, less straining of ab to help with dyspareunia./ urinary frequency, explained lowered position of urethra in relationship with dyscoordination of pelvic floor/ ab straining      Neuro Re-ed    Neuro Re-ed Details  cued for less ab overuse withpelvic floor mm coordination, less adductor use with stability of lower kinetic chain      Modalities   Modalities Moist Heat      Moist Heat Therapy   Number Minutes Moist Heat 5 Minutes    Moist Heat Location --   perineum, during instructions of deep core level 2     Manual Therapy   Manual therapy comments STM/MWM at problem areas notedin assessment to promote lengthening of pelvic floor and decrease tightness at anterior mm                      PT Long Term Goals - 07/20/21 1239       PT LONG TERM GOAL #1   Title Pt will decrease her Pelvic Pain FOTO  score from 17 pts to < 12 pts in order to improve QOL    Time 10    Period Weeks    Status New    Target Date 09/28/21      PT LONG TERM GOAL #2   Title Pt will demo leveleld pelvic girdle , medial aligned coccyx, no tenderness/ tightness at L coccygeus mm across 2 visits in order to progress to deep core                    to improve IAP            system    Baseline L iliac crest higher, coccyx L deviation    Time 2    Period Weeks    Status New    Target Date 08/03/21      PT LONG TERM GOAL #3   Title Pt will demo decreased abdominal separation from 3 fingers to < 1 fingers above umbilicus in order to improve pelvic girdle stability and minimze tight pelvic floor to tolerate pelvic exams and sexual intercourse    Time 6    Period Weeks    Status New    Target Date 08/31/21      PT LONG TERM GOAL #4   Title Pt will IND with proper mechanics and alignment in body mechanics, workout routine to minimize straining ab and pelvic floor  and exercise with less risk of injuries    Time 8    Period Weeks    Status New    Target Date 09/14/21      PT LONG TERM GOAL #5   Title Pt will demo decreased C section scar restrictions to promote more fascial mobility for deep core coactivation for pelvic function    Time 8    Period Weeks    Status New    Target Date 09/14/21                   Plan - 08/14/21 0906     Clinical Impression Statement Patient required further external and internal treatment to minimize anterior pelvic floor muscles and to learn anterior tilt of the pelvis for a more upward position of her urethra.  Patient required excessive cues for proprioception of the pelvis and for less abdominal overuse to elicit a proper upward pelvic floor movement.  Patient required further cues for  deep core exercises and was explained the benefit of proper coordination with less adductor and abdominal overuse.  Patient was explained future visits will be focused on deep core  coordination and fitness exercise and was advised to maintain pelvic floor stretches after her workout routines.  Plan to can continue with in internal pelvic floor treatment at upcoming visit. Pt continues to benefit from skilled PT.    Examination-Activity Limitations Lift;Toileting;Bend;Bed Mobility    Stability/Clinical Decision Making Evolving/Moderate complexity    Rehab Potential Good    PT Frequency 1x / week    PT Duration Other (comment)   10   PT Treatment/Interventions ADLs/Self Care Home Management;Cryotherapy;Electrical Stimulation;Moist Heat;Traction;Therapeutic exercise;Therapeutic activities;Functional mobility training;Patient/family education;Gait training;DME Instruction;Balance training;Neuromuscular re-education;Manual techniques;Passive range of motion;Dry needling;Joint Manipulations;Stair training;Splinting;Taping;Scar mobilization    Consulted and Agree with Plan of Care Patient             Patient will benefit from skilled therapeutic intervention in order to improve the following deficits and impairments:  Decreased endurance, Hypomobility, Decreased activity tolerance, Decreased strength, Pain, Improper body mechanics, Decreased coordination, Increased fascial restricitons, Impaired flexibility, Postural dysfunction, Decreased range of motion, Abnormal gait, Decreased balance, Difficulty walking, Decreased mobility, Decreased scar mobility, Increased muscle spasms, Decreased safety awareness, Hypermobility, Impaired sensation, Impaired tone, Impaired UE functional use  Visit Diagnosis: Other lack of coordination  Chronic bilateral low back pain with left-sided sciatica  Diastasis recti  Coccydynia  Chronic midline low back pain with left-sided sciatica     Problem List Patient Active Problem List   Diagnosis Date Noted   Post-menopausal bleeding 02/14/2021   Pelvic cramping 02/14/2021   Sebaceous cyst 08/16/2020   Postcoital UTI 05/09/2020   Skin  lesion of back 05/09/2020   Bilateral foot pain 08/16/2019   Hot flashes 08/16/2019   SI (sacroiliac) joint dysfunction 09/01/2017   Sciatica of left side 09/01/2017   Osteoarthritis of both knees 08/07/2016   Gastroesophageal reflux disease 08/07/2016   Palpitations 07/27/2014    Jerl Mina, PT 08/14/2021, 9:32 AM  [place] [address] [place], [place], [address] Phone: [phone]   Fax:  [phone]  Name: Brier Firebaugh MRN: [id number] Date of Birth: [birth year]

## 2021-08-14 NOTE — Patient Instructions (Signed)
before doing deep core 1-2  Focus on finding pelvic neutral ( first tilt forward and then back and then find the middle)  Feet firm, knees pointed to sky            Relax 6 points of contact at back of ribs, pelvis, feet                      __  Do deep core level 1-2 x 2 x day  ___ Continue with pelvic floor stretches and open book before and after workout

## 2021-08-20 ENCOUNTER — Other Ambulatory Visit: Payer: Self-pay

## 2021-08-21 ENCOUNTER — Encounter: Payer: Self-pay | Admitting: Family Medicine

## 2021-08-21 ENCOUNTER — Ambulatory Visit: Payer: No Typology Code available for payment source | Admitting: Physical Therapy

## 2021-08-21 ENCOUNTER — Other Ambulatory Visit: Payer: Self-pay

## 2021-08-21 ENCOUNTER — Ambulatory Visit (INDEPENDENT_AMBULATORY_CARE_PROVIDER_SITE_OTHER): Payer: No Typology Code available for payment source | Admitting: Family Medicine

## 2021-08-21 VITALS — BP 118/80 | HR 70 | Temp 98.0°F | Resp 16 | Ht 63.0 in | Wt 151.8 lb

## 2021-08-21 DIAGNOSIS — R278 Other lack of coordination: Secondary | ICD-10-CM

## 2021-08-21 DIAGNOSIS — M5442 Lumbago with sciatica, left side: Secondary | ICD-10-CM

## 2021-08-21 DIAGNOSIS — M533 Sacrococcygeal disorders, not elsewhere classified: Secondary | ICD-10-CM

## 2021-08-21 DIAGNOSIS — N950 Postmenopausal bleeding: Secondary | ICD-10-CM

## 2021-08-21 DIAGNOSIS — Z0000 Encounter for general adult medical examination without abnormal findings: Secondary | ICD-10-CM | POA: Diagnosis not present

## 2021-08-21 DIAGNOSIS — Z131 Encounter for screening for diabetes mellitus: Secondary | ICD-10-CM | POA: Diagnosis not present

## 2021-08-21 DIAGNOSIS — M6208 Separation of muscle (nontraumatic), other site: Secondary | ICD-10-CM

## 2021-08-21 DIAGNOSIS — G8929 Other chronic pain: Secondary | ICD-10-CM

## 2021-08-21 LAB — LIPID PANEL
Cholesterol: 177 mg/dL (ref 0–200)
HDL: 55.9 mg/dL (ref >39.00)
LDL Cholesterol: 115 mg/dL — ABNORMAL HIGH (ref 0–99)
NonHDL: 121.45
Total CHOL/HDL Ratio: 3
Triglycerides: 31 mg/dL (ref 0.0–149.0)
VLDL: 6.2 mg/dL (ref 0.0–40.0)

## 2021-08-21 LAB — COMPREHENSIVE METABOLIC PANEL
ALT: 18 U/L (ref 0–35)
AST: 19 U/L (ref 0–37)
Albumin: 4.7 g/dL (ref 3.5–5.2)
Alkaline Phosphatase: 98 U/L (ref 39–117)
BUN: 16 mg/dL (ref 6–23)
CO2: 30 mEq/L (ref 19–32)
Calcium: 10.3 mg/dL (ref 8.4–10.5)
Chloride: 102 mEq/L (ref 96–112)
Creatinine, Ser: 0.81 mg/dL (ref 0.40–1.20)
GFR: 84.07 mL/min (ref >60.00)
Glucose, Bld: 77 mg/dL (ref 70–99)
Potassium: 3.7 mEq/L (ref 3.5–5.1)
Sodium: 140 mEq/L (ref 135–145)
Total Bilirubin: 1 mg/dL (ref 0.2–1.2)
Total Protein: 7.7 g/dL (ref 6.0–8.3)

## 2021-08-21 LAB — CBC WITH DIFFERENTIAL/PLATELET
Basophils Absolute: 0 10*3/uL (ref 0.0–0.1)
Basophils Relative: 0.5 % (ref 0.0–3.0)
Eosinophils Absolute: 0.2 10*3/uL (ref 0.0–0.7)
Eosinophils Relative: 3.1 % (ref 0.0–5.0)
HCT: 43.7 % (ref 36.0–46.0)
Hemoglobin: 14.2 g/dL (ref 12.0–15.0)
Lymphocytes Relative: 41.7 % (ref 12.0–46.0)
Lymphs Abs: 2.6 10*3/uL (ref 0.7–4.0)
MCHC: 32.4 g/dL (ref 30.0–36.0)
MCV: 86.3 fl (ref 78.0–100.0)
Monocytes Absolute: 0.4 10*3/uL (ref 0.1–1.0)
Monocytes Relative: 6.4 % (ref 3.0–12.0)
Neutro Abs: 3 10*3/uL (ref 1.4–7.7)
Neutrophils Relative %: 48.3 % (ref 43.0–77.0)
Platelets: 139 10*3/uL — ABNORMAL LOW (ref 150.0–400.0)
RBC: 5.06 Mil/uL (ref 3.87–5.11)
RDW: 13.9 % (ref 11.5–15.5)
WBC: 6.3 10*3/uL (ref 4.0–10.5)

## 2021-08-21 LAB — TSH: TSH: 1.45 u[IU]/mL (ref 0.35–5.50)

## 2021-08-21 LAB — HEMOGLOBIN A1C: Hgb A1c MFr Bld: 5.5 % (ref 4.6–6.5)

## 2021-08-21 NOTE — Assessment & Plan Note (Signed)
A 1c added to labs Health habits are good

## 2021-08-21 NOTE — Patient Instructions (Addendum)
If you are interested in the shingles vaccine series (Shingrix), call your insurance or pharmacy to check on coverage and location it must be given.  If affordable - you can schedule it here or at your pharmacy depending on coverage   Call and see if a dexa (bone density) test is covered by insurance  Let me know and I will order it   Keep up the good diet and exercise  Labs today   Continue calcium and D for bone health

## 2021-08-21 NOTE — Progress Notes (Signed)
Subjective:    Patient ID: Jennifer Ware, female    DOB: [birth year], 51 y.o.   MRN: [id number]  This visit occurred during the SARS-CoV-2 public health emergency.  Safety protocols were in place, including screening questions prior to the visit, additional usage of staff PPE, and extensive cleaning of exam room while observing appropriate contact time as indicated for disinfecting solutions.   HPI 51 yo pt of Dr Einar Pheasant presents for health mt visit   Wt Readings from Last 3 Encounters:  08/21/21 151 lb 12.8 oz (68.9 kg)  08/01/21 154 lb (69.9 kg)  05/18/21 155 lb 9.6 oz (70.6 kg)   26.89 kg/m  Eats pretty good  Lifts weight for exercise    Zoster status : -thinking about shingrix   Covid immunized  Flu shot 04/2021 Tdap 2017  Pap 08/2020 nl with neg HPV  Mammogram 07/2021 Self breast exam -no lumps   Colonoscopy 08/2020  No family history of cancer   She sees dermatology for acne   Urology - using estrace cream  Started about 2 weeks ago- a lot of improvement    Gyn Dr Dione Plover , just started going there Had endometrial biopsy    Due for labs  Avoids fried food or red meat   Hopes her cholesterol is down this time  Lab Results  Component Value Date   CHOL 201 (H) 08/16/2020   HDL 66.10 08/16/2020   LDLCALC 129 (H) 08/16/2020   TRIG 31.0 08/16/2020   CHOLHDL 3 08/16/2020    Lab Results  Component Value Date   HGBA1C 5.4 08/16/2020   Used to take thyroid med years ago  Has ? Enl thyroid, no change   Patient Active Problem List   Diagnosis Date Noted   Diabetes mellitus screening 08/21/2021   Post-menopausal bleeding 02/14/2021   Pelvic cramping 02/14/2021   Sebaceous cyst 08/16/2020   Postcoital UTI 05/09/2020   Skin lesion of back 05/09/2020   Bilateral foot pain 08/16/2019   Hot flashes 08/16/2019   SI (sacroiliac) joint dysfunction 09/01/2017   Sciatica of left side 09/01/2017   Osteoarthritis of both knees 08/07/2016   Gastroesophageal  reflux disease 08/07/2016   Palpitations 07/27/2014   Routine general medical examination at a health care facility 06/07/2011   Past Medical History:  Diagnosis Date   Abdominal pain 10/29/2011   Amenorrhea 10/22/2011   Anxiety    Chronic kidney disease    chronic UTI    Chronic UTI 12/24/2011   Dyspareunia 04/19/2014   Ear discomfort 07/18/2011   GERD (gastroesophageal reflux disease)    HTN (hypertension) 04/25/2014   Hx gestational diabetes    Irregular periods/menstrual cycles 03/04/2014   Knee crepitus 07/27/2014   Low back pain 06/07/2011   Osteoarthritis of both knees 08/07/2016   Otitis externa 06/07/2011   Palpitations    Sciatica of left side 09/01/2017   Sensation of feeling cold 07/27/2014   SI (sacroiliac) joint dysfunction 09/01/2017   Thyroid disease    Viral URI with cough 09/07/2013   Past Surgical History:  Procedure Laterality Date   BREAST BIOPSY Right 08/24/2014   negative, u/s core   CESAREAN SECTION     x3   HERNIA REPAIR     umbilical   UMBILICAL HERNIA REPAIR     Social History   Tobacco Use   Smoking status: Never   Smokeless tobacco: Never  Vaping Use   Vaping Use: Never used  Substance Use Topics   Alcohol use: Yes  Comment: once a month    Drug use: No   Family History  Problem Relation Age of Onset   Asthma Father    Colon cancer Neg Hx    Breast cancer Neg Hx    Colon polyps Neg Hx    Esophageal cancer Neg Hx    Rectal cancer Neg Hx    Stomach cancer Neg Hx    Allergies  Allergen Reactions   Flagyl [Metronidazole]    Meloxicam    Silver Sulfadiazine    Sulfa Antibiotics    Current Outpatient Medications on File Prior to Visit  Medication Sig Dispense Refill   AMBULATORY NON FORMULARY MEDICATION Thigh-high, medium compression, graduated compression stockings. Apply to lower extremities. [email or web address], Zippered Compression Stockings, medium circ, long length 2 each 0   Ascorbic Acid (VITAMIN C) 1000 MG tablet Take  1,000 mg by mouth daily.     BIOTIN 5000 PO Take by mouth daily.     Calcium 600-200 MG-UNIT per tablet Take 2 tablets by mouth daily.     clindamycin (CLEOCIN T) 1 % SWAB Apply 1 application topically in the morning. 60 each 5   Cranberry 1000 MG CAPS Take 500 mg by mouth as needed.      cyanocobalamin 1000 MCG tablet Take 100 mcg by mouth daily.     diazepam (VALIUM) 10 MG tablet Take 1 tablet (10 mg total) by mouth as needed (Prior to intercourse). Do not use this medication orally.  Please 1 tablet in the vagina 1 hour prior to intercourse.  Please do not drive or use machinery after medication administration. 10 tablet 0   Diclofenac Sodium (PENNSAID) 2 % SOLN Apply 1 application topically daily as needed. 112 g 0   estradiol (ESTRACE) 0.1 MG/GM vaginal cream Estrogen Cream Instruction  Discard applicator  Apply pea sized amount to tip of finger to urethra before bed. Wash hands well after application. Use Monday, Wednesday and Friday 42.5 g 5   Ginkgo Biloba 40 MG TABS Take 1 tablet by mouth daily.     glucosamine-chondroitin 500-400 MG tablet Take 1 tablet by mouth 2 (two) times daily.     Lecithin 1200 MG CAPS Take 1 capsule by mouth as needed.      Multiple Vitamins-Minerals (ANTIOXIDANT PO) Take by mouth daily.     phenazopyridine (PYRIDIUM) 95 MG tablet Take 1 tablet (95 mg total) by mouth 3 (three) times daily as needed for pain. 10 tablet 0   tretinoin (RETIN-A) 0.05 % cream Apply topically at bedtime. 45 g 5   Current Facility-Administered Medications on File Prior to Visit  Medication Dose Route Frequency Provider Last Rate Last Admin   0.9 %  sodium chloride infusion  500 mL Intravenous Once Milus Banister, MD        Review of Systems  Constitutional:  Negative for activity change, appetite change, fatigue, fever and unexpected weight change.  HENT:  Negative for congestion, ear pain, rhinorrhea, sinus pressure and sore throat.   Eyes:  Negative for pain, redness and  visual disturbance.  Respiratory:  Negative for cough, shortness of breath and wheezing.   Cardiovascular:  Negative for chest pain and palpitations.  Gastrointestinal:  Negative for abdominal pain, blood in stool, constipation and diarrhea.  Endocrine: Negative for polydipsia and polyuria.  Genitourinary:  Negative for dysuria, frequency and urgency.       Using estrogen vaginal cream for freq utis  Musculoskeletal:  Negative for arthralgias, back pain and myalgias.  Skin:  Negative for pallor and rash.  Allergic/Immunologic: Negative for environmental allergies.  Neurological:  Negative for dizziness, syncope and headaches.  Hematological:  Negative for adenopathy. Does not bruise/bleed easily.  Psychiatric/Behavioral:  Negative for decreased concentration and dysphoric mood. The patient is not nervous/anxious.       Objective:   Physical Exam Constitutional:      General: She is not in acute distress.    Appearance: Normal appearance. She is well-developed and normal weight. She is not ill-appearing or diaphoretic.  HENT:     Head: Normocephalic and atraumatic.     Right Ear: Tympanic membrane, ear canal and external ear normal.     Left Ear: Tympanic membrane, ear canal and external ear normal.     Nose: Nose normal. No congestion.     Mouth/Throat:     Mouth: Mucous membranes are moist.     Pharynx: Oropharynx is clear. No posterior oropharyngeal erythema.  Eyes:     General: No scleral icterus.    Extraocular Movements: Extraocular movements intact.     Conjunctiva/sclera: Conjunctivae normal.     Pupils: Pupils are equal, round, and reactive to light.  Neck:     Thyroid: No thyromegaly.     Vascular: No carotid bruit or JVD.     Comments: Prominent thyroid  Per pt -her baseline  Cardiovascular:     Rate and Rhythm: Normal rate and regular rhythm.     Pulses: Normal pulses.     Heart sounds: Normal heart sounds.    No gallop.  Pulmonary:     Effort: Pulmonary effort  is normal. No respiratory distress.     Breath sounds: Normal breath sounds. No wheezing.     Comments: Good air exch Chest:     Chest wall: No tenderness.  Abdominal:     General: Bowel sounds are normal. There is no distension or abdominal bruit.     Palpations: Abdomen is soft. There is no mass.     Tenderness: There is no abdominal tenderness.     Hernia: No hernia is present.  Genitourinary:    Comments: Breast exam: No mass, nodules, thickening, tenderness, bulging, retraction, inflamation, nipple discharge or skin changes noted.  No axillary or clavicular LA.     Breast tissue is uniformly dense Musculoskeletal:        General: No tenderness. Normal range of motion.     Cervical back: Normal range of motion and neck supple. No rigidity. No muscular tenderness.     Right lower leg: No edema.     Left lower leg: No edema.     Comments: No kyphosis   Lymphadenopathy:     Cervical: No cervical adenopathy.  Skin:    General: Skin is warm and dry.     Coloration: Skin is not pale.     Findings: No erythema or rash.  Neurological:     Mental Status: She is alert. Mental status is at baseline.     Cranial Nerves: No cranial nerve deficit.     Motor: No abnormal muscle tone.     Coordination: Coordination normal.     Gait: Gait normal.     Deep Tendon Reflexes: Reflexes are normal and symmetric. Reflexes normal.  Psychiatric:        Mood and Affect: Mood normal.        Cognition and Memory: Cognition and memory normal.          Assessment & Plan:   Problem List Items Addressed  This Visit       Other   Routine general medical examination at a health care facility - Primary    Reviewed health habits including diet and exercise and skin cancer prevention Reviewed appropriate screening tests for age  Also reviewed health mt list, fam hx and immunization status , as well as social and family history   See HPI Labs ordered (pt also req a1c for DM screen) Immunizations  utd Pap utd (sees gyn newly as well) Mammogram utd, nl exam  Colonoscopy utd  No fam h/o cancer  Enc good diet and exercise  Pt is post menopausal and interested in dexa- adv to check on insurance coverage and message [place] if she wants it/ we will order  Enc her to continue ca and D and exercise for bone  health      Relevant Orders   CBC with Differential/Platelet   Comprehensive metabolic panel   Lipid panel   TSH   Post-menopausal bleeding    Pt had endo bx with gyn      Diabetes mellitus screening    A 1c added to labs Health habits are good       Relevant Orders   Hemoglobin A1c

## 2021-08-21 NOTE — Addendum Note (Signed)
Addended by: Jerl Mina on: 08/21/2021 01:25 PM   Modules accepted: Orders

## 2021-08-21 NOTE — Assessment & Plan Note (Signed)
Reviewed health habits including diet and exercise and skin cancer prevention Reviewed appropriate screening tests for age  Also reviewed health mt list, fam hx and immunization status , as well as social and family history   See HPI Labs ordered (pt also req a1c for DM screen) Immunizations utd Pap utd (sees gyn newly as well) Mammogram utd, nl exam  Colonoscopy utd  No fam h/o cancer  Enc good diet and exercise  Pt is post menopausal and interested in dexa- adv to check on insurance coverage and message [place] if she wants it/ we will order  Enc her to continue ca and D and exercise for bone  health

## 2021-08-21 NOTE — Therapy (Signed)
[place] [facility] [address] [place], [place], [address] Phone: [phone]   Fax:  [phone]  Physical Therapy Treatment  Patient Details  Name: Jennifer Ware MRN: [id number] Date of Birth: 03/02/1971 Referring Provider (PT): Einar Pheasant MD   Encounter Date: 08/21/2021   PT End of Session - 08/21/21 0923     Visit Number 5    Number of Visits 10    Date for PT Re-Evaluation 09/28/21    PT Start Time 0902    PT Stop Time 1000    PT Time Calculation (min) 58 min    Activity Tolerance Patient tolerated treatment well;No increased pain    Behavior During Therapy [facility] for tasks assessed/performed             Past Medical History:  Diagnosis Date   Abdominal pain 10/29/2011   Amenorrhea 10/22/2011   Anxiety    Chronic kidney disease    chronic UTI    Chronic UTI 12/24/2011   Dyspareunia 04/19/2014   Ear discomfort 07/18/2011   GERD (gastroesophageal reflux disease)    HTN (hypertension) 04/25/2014   Hx gestational diabetes    Irregular periods/menstrual cycles 03/04/2014   Knee crepitus 07/27/2014   Low back pain 06/07/2011   Osteoarthritis of both knees 08/07/2016   Otitis externa 06/07/2011   Palpitations    Sciatica of left side 09/01/2017   Sensation of feeling cold 07/27/2014   SI (sacroiliac) joint dysfunction 09/01/2017   Thyroid disease    Viral URI with cough 09/07/2013    Past Surgical History:  Procedure Laterality Date   BREAST BIOPSY Right 08/24/2014   negative, u/s core   CESAREAN SECTION     x3   HERNIA REPAIR     umbilical   UMBILICAL HERNIA REPAIR      There were no vitals filed for this visit.   Subjective Assessment - 08/21/21 0907     Subjective Pt reported she is not noticing any changes with  urinary frequency. LBP is okay 4-5/10    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    Limitations  Walking;Standing    Patient Stated Goals less pelvic and LBP, have sex with no difficulty                [facility] PT Assessment - 08/21/21 1001       Coordination   Coordination and Movement Description upper trap overuse with inhalation                        Pelvic Floor Special Questions - 08/21/21 0943     External Palpation B distal/medial scar of Csection is retricted    Pelvic Floor Internal Exam Pt consented verbally and  had no contraindications    Exam Type Vaginal    Palpation no tightness/ tenderness at ischiocavernosus B , urehtra lowered behind pubic symphysis, cued for anterior tilt of pelvis,               [facility] Adult PT Treatment/Exercise - 08/21/21 0928       Therapeutic Activites    Other Therapeutic Activities explained progression of POC, anatomy/ physiology related to frequency, behavorial patterns at work affecting frequency as pt reports she has noticed sometimes she did not pee for 12 hour shift and her bladder hurt for 3 days  Neuro Re-ed    Neuro Re-ed Details  cued for lateral excursion of diaphragm, toe abduction for relaxation of pelvic floor      Manual Therapy   Manual therapy comments fascial releases in sidelying to release C section scars lateral borders and medial , reassessed pelvic floor internallyand provided tactile and verbal feedback for relaxation of pelvic floor with anterior tilt of pelvisand toe abduction                         PT Long Term Goals - 07/20/21 1239       PT LONG TERM GOAL #1   Title Pt will decrease her Pelvic Pain FOTO score from 17 pts to < 12 pts in order to improve QOL    Time 10    Period Weeks    Status New    Target Date 09/28/21      PT LONG TERM GOAL #2   Title Pt will demo leveleld pelvic girdle , medial aligned coccyx, no tenderness/ tightness at L coccygeus mm across 2 visits in order to progress to deep core                    to improve IAP            system     Baseline L iliac crest higher, coccyx L deviation    Time 2    Period Weeks    Status New    Target Date 08/03/21      PT LONG TERM GOAL #3   Title Pt will demo decreased abdominal separation from 3 fingers to < 1 fingers above umbilicus in order to improve pelvic girdle stability and minimze tight pelvic floor to tolerate pelvic exams and sexual intercourse    Time 6    Period Weeks    Status New    Target Date 08/31/21      PT LONG TERM GOAL #4   Title Pt will IND with proper mechanics and alignment in body mechanics, workout routine to minimize straining ab and pelvic floor  and exercise with less risk of injuries    Time 8    Period Weeks    Status New    Target Date 09/14/21      PT LONG TERM GOAL #5   Title Pt will demo decreased C section scar restrictions to promote more fascial mobility for deep core coactivation for pelvic function    Time 8    Period Weeks    Status New    Target Date 09/14/21                   Plan - 08/21/21 1036     Clinical Impression Statement Pt showed improvement with no more tightness/ tenderness at pelvicfloor today with internal assessment. Urethra is still low , suprapubic scar is still restricted. Pt still required cues for optimal diaphragmatic excursion to lengthen pelvic floor. These areas were addressed with manual Tx and neuromuscular re-education. 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 Explained anatomy/ physiology of pelvic floor lengthening and coordination for dyspareunia and urinary frequency issues. Plan at next session to address lower kinetic chain and its role in fitness routine as pt showed deficits of feet impacting tighter pelvic floor mm.   Examination-Activity Limitations Lift;Toileting;Bend;Bed Mobility    Stability/Clinical Decision Making  Evolving/Moderate complexity    Rehab Potential Good    PT  Frequency 1x / week    PT Duration Other (comment)   10   PT Treatment/Interventions ADLs/Self Care Home Management;Cryotherapy;Electrical Stimulation;Moist Heat;Traction;Therapeutic exercise;Therapeutic activities;Functional mobility training;Patient/family education;Gait training;DME Instruction;Balance training;Neuromuscular re-education;Manual techniques;Passive range of motion;Dry needling;Joint Manipulations;Stair training;Splinting;Taping;Scar mobilization    Consulted and Agree with Plan of Care Patient             Patient will benefit from skilled therapeutic intervention in order to improve the following deficits and impairments:  Decreased endurance, Hypomobility, Decreased activity tolerance, Decreased strength, Pain, Improper body mechanics, Decreased coordination, Increased fascial restricitons, Impaired flexibility, Postural dysfunction, Decreased range of motion, Abnormal gait, Decreased balance, Difficulty walking, Decreased mobility, Decreased scar mobility, Increased muscle spasms, Decreased safety awareness, Hypermobility, Impaired sensation, Impaired tone, Impaired UE functional use  Visit Diagnosis: Chronic bilateral low back pain with left-sided sciatica  Other lack of coordination  Diastasis recti  Coccydynia  Chronic midline low back pain with left-sided sciatica  Sacrococcygeal disorders, not elsewhere classified     Problem List Patient Active Problem List   Diagnosis Date Noted   Post-menopausal bleeding 02/14/2021   Pelvic cramping 02/14/2021   Sebaceous cyst 08/16/2020   Postcoital UTI 05/09/2020   Skin lesion of back 05/09/2020   Bilateral foot pain 08/16/2019   Hot flashes 08/16/2019   SI (sacroiliac) joint dysfunction 09/01/2017   Sciatica of left side 09/01/2017   Osteoarthritis of both knees 08/07/2016   Gastroesophageal reflux disease 08/07/2016   Palpitations 07/27/2014    Jerl Mina, PT 08/21/2021, 10:40 AM  [facility] [place], [place], [address] Phone: [phone]   Fax:  [phone]  Name: Jennifer Ware MRN: [id number] Date of Birth: 1970-09-10

## 2021-08-21 NOTE — Assessment & Plan Note (Signed)
Pt had endo bx with gyn

## 2021-08-22 ENCOUNTER — Ambulatory Visit: Payer: No Typology Code available for payment source | Admitting: Physical Therapy

## 2021-08-23 ENCOUNTER — Encounter: Payer: No Typology Code available for payment source | Admitting: Physical Therapy

## 2021-08-24 ENCOUNTER — Encounter: Payer: No Typology Code available for payment source | Admitting: Physical Therapy

## 2021-08-28 ENCOUNTER — Other Ambulatory Visit: Payer: Self-pay

## 2021-08-28 ENCOUNTER — Ambulatory Visit: Payer: No Typology Code available for payment source | Admitting: Physical Therapy

## 2021-08-28 DIAGNOSIS — R278 Other lack of coordination: Secondary | ICD-10-CM | POA: Diagnosis not present

## 2021-08-28 DIAGNOSIS — G8929 Other chronic pain: Secondary | ICD-10-CM

## 2021-08-28 DIAGNOSIS — M533 Sacrococcygeal disorders, not elsewhere classified: Secondary | ICD-10-CM

## 2021-08-28 DIAGNOSIS — M6208 Separation of muscle (nontraumatic), other site: Secondary | ICD-10-CM

## 2021-08-28 NOTE — Patient Instructions (Addendum)
Biopsychosocial approaches to IC , urinary frequency Chronic Psychological Stress Enhances Nociceptive Processing in the Urinary Bladder in High-Anxiety Rats - PMC ([email or web address].es)   https://www.bailey.com/   https://booth.biz/   __  Body scan technique recording  provided via email. Daily practice   Article on mindfulness and pain:  PrintStats.tn    http://www.painresearchforum.org/news/49812-synaptic-mechanism-may-link-pain-anxiety-brain    MassAdvertisement.it    __  Continued with gentle knee rocking with gentle pulling on center of scar  towards head direction at low ab to provide stretch as knee rocks side to side

## 2021-08-28 NOTE — Therapy (Signed)
[place] [facility] SERVICES [address] [place], [place], [address] Phone: [phone]   Fax:  [phone]  Physical Therapy Treatment  Patient Details  Name: Jennifer Ware MRN: [id number] Date of Birth: 08/19/1970 Referring Provider (PT): Einar Pheasant MD   Encounter Date: 08/28/2021   PT End of Session - 08/28/21 1157     Visit Number 6    Number of Visits 10    Date for PT Re-Evaluation 09/28/21    PT Start Time 0800    PT Stop Time 0900    PT Time Calculation (min) 60 min    Activity Tolerance Patient tolerated treatment well;No increased pain    Behavior During Therapy [facility] for tasks assessed/performed             Past Medical History:  Diagnosis Date   Abdominal pain 10/29/2011   Amenorrhea 10/22/2011   Anxiety    Chronic kidney disease    chronic UTI    Chronic UTI 12/24/2011   Dyspareunia 04/19/2014   Ear discomfort 07/18/2011   GERD (gastroesophageal reflux disease)    HTN (hypertension) 04/25/2014   Hx gestational diabetes    Irregular periods/menstrual cycles 03/04/2014   Knee crepitus 07/27/2014   Low back pain 06/07/2011   Osteoarthritis of both knees 08/07/2016   Otitis externa 06/07/2011   Palpitations    Sciatica of left side 09/01/2017   Sensation of feeling cold 07/27/2014   SI (sacroiliac) joint dysfunction 09/01/2017   Thyroid disease    Viral URI with cough 09/07/2013    Past Surgical History:  Procedure Laterality Date   BREAST BIOPSY Right 08/24/2014   negative, u/s core   CESAREAN SECTION     x3   HERNIA REPAIR     umbilical   UMBILICAL HERNIA REPAIR      There were no vitals filed for this visit.   Subjective Assessment - 08/28/21 0803     Subjective Pt reported she was on vacation last week and noticed she was relaxed and had less pelvic issues.  Pt feels she is not getting better. Pt ate something spicy and notices frequency Sx returned. Her providers told her her Sx are related to [place]      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    Limitations Walking;Standing    Patient Stated Goals less pelvic and LBP, have sex with no difficulty                [facility] PT Assessment - 08/28/21 1356       Observation/Other Assessments   Observations poor co-activation of feet and anterior til tof pelvis in hooklying, reviewing deep core      Palpation   Palpation comment restricted medial aspect of C-section scar, distal ends no longer restricted                           [facility] Adult PT Treatment/Exercise - 08/28/21 1354       Therapeutic Activites    Other Therapeutic Activities explained biopyschosocial approaches to pelvic floor dysfunciton, explained anatomy/ physiology, nn system on IC, frequency, active listening and motivational interviewing      Neuro Re-ed    Neuro Re-ed Details  guided relaxation practice , body scan      Manual Therapy   Manual therapy comments fascial releases in hooklying to minimize  medial aspect of C-section scars,                       PT Short Term Goals - 07/27/20 0907       PT SHORT TERM GOAL #1   Title Pt will be independent with HEP in order to improve strength and decrease back pain in order to improve pain-free function at home and work.    Baseline 06/12/20 HEP given; 07/27/20 completing HEP    Time 4    Period Weeks    Status Achieved               PT Long Term Goals - 08/28/21 1402       PT LONG TERM GOAL #1   Title Pt will decrease her Pelvic Pain FOTO score from 17 pts to < 12 pts in order to improve QOL    Time 10    Period Weeks    Status On-going    Target Date 09/28/21      PT LONG TERM GOAL #2   Title Pt will demo leveleld pelvic girdle , medial aligned coccyx, no tenderness/ tightness at L coccygeus mm across 2 visits in order to progress to deep core                    to  improve IAP            system    Baseline L iliac crest higher, coccyx L deviation    Time 2    Period Weeks    Status Achieved    Target Date 08/03/21      PT LONG TERM GOAL #3   Title Pt will demo decreased abdominal separation from 3 fingers to < 1 fingers above umbilicus in order to improve pelvic girdle stability and minimze tight pelvic floor to tolerate pelvic exams and sexual intercourse    Time 6    Period Weeks    Status Achieved    Target Date 08/31/21      PT LONG TERM GOAL #4   Title Pt will IND with proper mechanics and alignment in body mechanics, workout routine to minimize straining ab and pelvic floor  and exercise with less risk of injuries    Time 8    Period Weeks    Status On-going    Target Date 09/14/21      PT LONG TERM GOAL #5   Title Pt will demo decreased C section scar restrictions to promote more fascial mobility for deep core coactivation for pelvic function    Time 8    Period Weeks    Status Partially Met    Target Date 09/14/21                   Plan - 08/28/21 1157     Clinical Impression Statement Pt was provided explanation of  biopyschosocial approaches to pelvic floor dysfunciton, explained anatomy/ physiology, nn system on IC, frequency, active listening and motivational interviewing .  Pt feels she is not better. Pt reported her pelvic floor issues were less while she was on vacation and felt relaxed. Pt was educated on mindfulness. Body scan technique today and pt reported feeling relaxed afterwards and less pelvic Sx. Pt was provided research articles that highlight the  biopyschosocial approaches to pelvic floor dysfunciton, explained anatomy/ physiology, nn system on IC, frequency.  Pt required more manual Tx to minimizing remaining C section scar restrictions that  was localized at medial aspect of scar. Distal ends of scar are less restrictions. Pt was explained how the scar adhesions are near the borders of her bladder.  Anticipate pt will demo more pelvic floor and low abdominal mobility after scar adhesions are addressed.   Pt continues to benefit from skilled PT.    Examination-Activity Limitations Lift;Toileting;Bend;Bed Mobility    Stability/Clinical Decision Making Evolving/Moderate complexity    Rehab Potential Good    PT Frequency 1x / week    PT Duration Other (comment)   10   PT Treatment/Interventions ADLs/Self Care Home Management;Cryotherapy;Electrical Stimulation;Moist Heat;Traction;Therapeutic exercise;Therapeutic activities;Functional mobility training;Patient/family education;Gait training;DME Instruction;Balance training;Neuromuscular re-education;Manual techniques;Passive range of motion;Dry needling;Joint Manipulations;Stair training;Splinting;Taping;Scar mobilization    Consulted and Agree with Plan of Care Patient             Patient will benefit from skilled therapeutic intervention in order to improve the following deficits and impairments:  Decreased endurance, Hypomobility, Decreased activity tolerance, Decreased strength, Pain, Improper body mechanics, Decreased coordination, Increased fascial restricitons, Impaired flexibility, Postural dysfunction, Decreased range of motion, Abnormal gait, Decreased balance, Difficulty walking, Decreased mobility, Decreased scar mobility, Increased muscle spasms, Decreased safety awareness, Hypermobility, Impaired sensation, Impaired tone, Impaired UE functional use  Visit Diagnosis: Chronic bilateral low back pain with left-sided sciatica  Other lack of coordination  Diastasis recti  Coccydynia  Chronic midline low back pain with left-sided sciatica  Sacrococcygeal disorders, not elsewhere classified     Problem List Patient Active Problem List   Diagnosis Date Noted   Diabetes mellitus screening 08/21/2021   Post-menopausal bleeding 02/14/2021   Pelvic cramping 02/14/2021   Sebaceous cyst 08/16/2020   Postcoital UTI 05/09/2020    Skin lesion of back 05/09/2020   Bilateral foot pain 08/16/2019   Hot flashes 08/16/2019   SI (sacroiliac) joint dysfunction 09/01/2017   Sciatica of left side 09/01/2017   Osteoarthritis of both knees 08/07/2016   Gastroesophageal reflux disease 08/07/2016   Palpitations 07/27/2014   Routine general medical examination at a health care facility 06/07/2011    Jerl Mina, PT 08/28/2021, 2:03 PM  [facility] [address] [place], [place], [address] Phone: [phone]   Fax:  [phone]  Name: Jennifer Ware MRN: [id number] Date of Birth: 23-Jul-1971

## 2021-09-04 ENCOUNTER — Ambulatory Visit: Payer: No Typology Code available for payment source | Admitting: Physical Therapy

## 2021-09-11 ENCOUNTER — Encounter: Payer: No Typology Code available for payment source | Admitting: Physical Therapy

## 2021-09-17 ENCOUNTER — Other Ambulatory Visit: Payer: Self-pay

## 2021-09-18 ENCOUNTER — Encounter: Payer: No Typology Code available for payment source | Admitting: Physical Therapy

## 2021-09-25 ENCOUNTER — Ambulatory Visit: Payer: No Typology Code available for payment source | Attending: Family Medicine | Admitting: Physical Therapy

## 2021-10-02 ENCOUNTER — Encounter: Payer: No Typology Code available for payment source | Admitting: Physical Therapy

## 2021-10-03 ENCOUNTER — Encounter: Payer: Self-pay | Admitting: Family Medicine

## 2021-10-03 ENCOUNTER — Other Ambulatory Visit: Payer: Self-pay

## 2021-10-03 ENCOUNTER — Other Ambulatory Visit: Payer: Self-pay | Admitting: Family Medicine

## 2021-10-04 ENCOUNTER — Other Ambulatory Visit: Payer: Self-pay

## 2021-10-04 MED ORDER — FLUOCINOLONE ACETONIDE 0.01 % OT OIL
TOPICAL_OIL | OTIC | 1 refills | Status: DC
  Filled 2021-10-04: qty 20, 30d supply, fill #0
  Filled 2022-05-17: qty 20, 30d supply, fill #1

## 2021-10-09 ENCOUNTER — Encounter: Payer: No Typology Code available for payment source | Admitting: Physical Therapy

## 2021-10-16 ENCOUNTER — Encounter: Payer: No Typology Code available for payment source | Admitting: Physical Therapy

## 2021-10-23 ENCOUNTER — Encounter: Payer: No Typology Code available for payment source | Admitting: Physical Therapy

## 2021-10-24 ENCOUNTER — Other Ambulatory Visit: Payer: Self-pay

## 2021-10-24 ENCOUNTER — Ambulatory Visit (INDEPENDENT_AMBULATORY_CARE_PROVIDER_SITE_OTHER): Payer: No Typology Code available for payment source

## 2021-10-24 DIAGNOSIS — Z23 Encounter for immunization: Secondary | ICD-10-CM

## 2021-10-24 NOTE — Progress Notes (Signed)
Per orders of Dr. Leonides Cave injection of shingrix given by Loreen Freud. ?Patient tolerated injection well.  ?

## 2021-10-30 ENCOUNTER — Encounter: Payer: No Typology Code available for payment source | Admitting: Physical Therapy

## 2021-12-27 ENCOUNTER — Telehealth: Payer: Self-pay | Admitting: Family Medicine

## 2021-12-27 ENCOUNTER — Ambulatory Visit (INDEPENDENT_AMBULATORY_CARE_PROVIDER_SITE_OTHER): Payer: No Typology Code available for payment source | Admitting: Nurse Practitioner

## 2021-12-27 ENCOUNTER — Encounter: Payer: Self-pay | Admitting: Nurse Practitioner

## 2021-12-27 ENCOUNTER — Other Ambulatory Visit: Payer: Self-pay

## 2021-12-27 ENCOUNTER — Ambulatory Visit (INDEPENDENT_AMBULATORY_CARE_PROVIDER_SITE_OTHER)
Admission: RE | Admit: 2021-12-27 | Discharge: 2021-12-27 | Disposition: A | Discharge status: Home / Self Care | Payer: No Typology Code available for payment source | Source: Ambulatory Visit | Attending: Nurse Practitioner | Admitting: Nurse Practitioner

## 2021-12-27 VITALS — BP 110/76 | HR 66 | Temp 97.3°F | Resp 14 | Wt 150.0 lb

## 2021-12-27 DIAGNOSIS — R1084 Generalized abdominal pain: Secondary | ICD-10-CM

## 2021-12-27 DIAGNOSIS — R3121 Asymptomatic microscopic hematuria: Secondary | ICD-10-CM | POA: Diagnosis not present

## 2021-12-27 LAB — COMPREHENSIVE METABOLIC PANEL
ALT: 18 U/L (ref 0–35)
AST: 19 U/L (ref 0–37)
Albumin: 4.5 g/dL (ref 3.5–5.2)
Alkaline Phosphatase: 102 U/L (ref 39–117)
BUN: 13 mg/dL (ref 6–23)
CO2: 29 mEq/L (ref 19–32)
Calcium: 10.2 mg/dL (ref 8.4–10.5)
Chloride: 104 mEq/L (ref 96–112)
Creatinine, Ser: 0.91 mg/dL (ref 0.40–1.20)
GFR: 72.93 mL/min (ref >60.00)
Glucose, Bld: 97 mg/dL (ref 70–99)
Potassium: 3.8 mEq/L (ref 3.5–5.1)
Sodium: 139 mEq/L (ref 135–145)
Total Bilirubin: 0.6 mg/dL (ref 0.2–1.2)
Total Protein: 7.8 g/dL (ref 6.0–8.3)

## 2021-12-27 LAB — URINALYSIS, MICROSCOPIC ONLY
RBC / HPF: NONE SEEN (ref 0–?)
WBC, UA: NONE SEEN (ref 0–?)

## 2021-12-27 LAB — CBC WITH DIFFERENTIAL/PLATELET
Basophils Absolute: 0 10*3/uL (ref 0.0–0.1)
Basophils Relative: 0.6 % (ref 0.0–3.0)
Eosinophils Absolute: 0.1 10*3/uL (ref 0.0–0.7)
Eosinophils Relative: 2.1 % (ref 0.0–5.0)
HCT: 42 % (ref 36.0–46.0)
Hemoglobin: 13.9 g/dL (ref 12.0–15.0)
Lymphocytes Relative: 30 % (ref 12.0–46.0)
Lymphs Abs: 1.8 10*3/uL (ref 0.7–4.0)
MCHC: 33.2 g/dL (ref 30.0–36.0)
MCV: 86.2 fl (ref 78.0–100.0)
Monocytes Absolute: 0.4 10*3/uL (ref 0.1–1.0)
Monocytes Relative: 6.8 % (ref 3.0–12.0)
Neutro Abs: 3.6 10*3/uL (ref 1.4–7.7)
Neutrophils Relative %: 60.5 % (ref 43.0–77.0)
Platelets: 125 10*3/uL — ABNORMAL LOW (ref 150.0–400.0)
RBC: 4.87 Mil/uL (ref 3.87–5.11)
RDW: 13.7 % (ref 11.5–15.5)
WBC: 6 10*3/uL (ref 4.0–10.5)

## 2021-12-27 LAB — POCT URINALYSIS DIPSTICK
Bilirubin, UA: NEGATIVE
Glucose, UA: NEGATIVE
Ketones, UA: NEGATIVE
Leukocytes, UA: NEGATIVE
Nitrite, UA: NEGATIVE
Protein, UA: NEGATIVE
Spec Grav, UA: <=1.005 — AB (ref 1.010–1.025)
Urobilinogen, UA: 0.2 E.U./dL
pH, UA: 7 (ref 5.0–8.0)

## 2021-12-27 LAB — LIPASE: Lipase: 33 U/L (ref 11.0–59.0)

## 2021-12-27 LAB — POCT URINE PREGNANCY: Preg Test, Ur: NEGATIVE

## 2021-12-27 MED ORDER — DICLOFENAC SODIUM 2 % EX SOLN: 1.0000 "application " | Freq: Every day | CUTANEOUS | 0 refills | Status: DC | PRN

## 2021-12-27 NOTE — Progress Notes (Signed)
Acute Office Visit  Subjective:     Patient ID: Jennifer Ware, female    DOB: 04/02/1971, 51 y.o.   MRN: [id number]  Chief Complaint  Patient presents with   Abdominal Pain    Started on 12/25/21 with some abdominal discomfort then last night became very intense, became sweaty, and felt like she would pass out from pain. Pain is located in the mid stomach. She had a couple of bowel movements last night that helped the pain some. Took Tylenol and it helped some. Belching and more excessive gas.      Patient is in today for [place] that it started on 12/25/2021. States that it was bearable pain and felt bloated. States that morning she had a BM and gelt full. States yesterday night she was having increased pain and sweaty and elt like she was going to pass out. States that she had a a couple BMs and the pain settled down. She took tylenol that helped some. She enodrses belching and passing gas. History of H pylori. States the pain is still there.  LMP: states stopped for approx 2 years. Then came last year.  Has been less than a year since she had a menstrual period   Review of Systems  Constitutional:  Negative for chills and fever.  Gastrointestinal:  Positive for abdominal pain. Negative for constipation, diarrhea, nausea and vomiting.  Genitourinary:  Negative for dysuria and hematuria.  Neurological:  Positive for headaches (relieved with tylenol).       Objective:    BP 110/76   Pulse 66   Temp (!) 97.3 F (36.3 C)   Resp 14   Wt 150 lb (68 kg)   LMP 09/04/2018   SpO2 98%   BMI 26.57 kg/m    Physical Exam Vitals and nursing note reviewed.  Constitutional:      Appearance: Normal appearance. She is well-developed.  Cardiovascular:     Rate and Rhythm: Normal rate and regular rhythm.     Heart sounds: Normal heart sounds.  Pulmonary:     Breath sounds: Normal breath sounds.  Abdominal:     General: Bowel sounds are normal. There is no  distension.     Palpations: There is no mass.     Tenderness: There is abdominal tenderness.     Hernia: No hernia is present.    Musculoskeletal:     Right lower leg: No edema.     Left lower leg: No edema.  Skin:    General: Skin is warm.  Neurological:     Mental Status: She is alert.    Results for orders placed or performed in visit on 12/27/21  POCT urinalysis dipstick  Result Value Ref Range   Color, UA yellow    Clarity, UA clear    Glucose, UA Negative Negative   Bilirubin, UA negative    Ketones, UA negative    Spec Grav, UA <=1.005 (A) 1.010 - 1.025   Blood, UA trace    pH, UA 7.0 5.0 - 8.0   Protein, UA Negative Negative   Urobilinogen, UA 0.2 0.2 or 1.0 E.U./dL   Nitrite, UA negative    Leukocytes, UA Negative Negative   Appearance     Odor    POCT urine pregnancy  Result Value Ref Range   Preg Test, Ur Negative Negative        Assessment & Plan:   Problem List Items Addressed This Visit  Genitourinary   Asymptomatic microscopic hematuria    Incidental finding on UA in office today.  We will send off for microscopy and culture to rule out infection and apparent blood in urine.  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  Does sound related to bowel movements we will obtain a abdomen 1 view to assess stool burden and patient.  Also check basic blood work.  Patient does have history of H. pylori we will send off stool antigen to make sure this is not a contributing factor.  Patient is states she is having more gas in the regards of belching and flatus.  If work-up benign consider adding a probiotic or PPI       Relevant Orders   POCT urinalysis dipstick (Completed)   POCT urine pregnancy (Completed)   CBC with Differential/Platelet   Comprehensive metabolic panel   Lipase   Helicobacter pylori special antigen   DG Abd 1 View     No orders of the defined types were placed in this encounter.   No follow-ups on file.  Romilda Garret, NP

## 2021-12-27 NOTE — Patient Instructions (Signed)
Nice to see you today I will be in touch with the labs and xray once I have them Follow up if no improvement  

## 2021-12-27 NOTE — Telephone Encounter (Signed)
[facility] - Client TELEPHONE ADVICE RECORD [facility] Patient Name: Jennifer Ware Gender: Female DOB: 05-02-1971 Age: 51 Y 35 M 28 D Return Phone Number: [phone] (Primary) Address: City/ State/ Zip: [place] [address] Client [facility] - Client Client Site [facility] - Day Provider Waunita Schooner- MD Contact Type Call Who Is Calling Patient / Member / Family / Caregiver Call Type Triage / Clinical Caller Name Minette Brine Relationship To Patient Other Return Phone Number [phone] (Primary) Chief Complaint SEVERE ABDOMINAL PAIN - Severe pain in abdomen Reason for Call Symptomatic / Request for Melissa has a patient on the line and is having severe stomach pain. Caller from the office. SX passing gas and burping alot, heavy bowel movements. Translation No Nurse Assessment Nurse: Altamease Oiler, RN, Adriana Date/Time (Eastern Time): 12/27/2021 8:26:55 AM Confirm and document reason for call. If symptomatic, describe symptoms. ---pt states she started having abd pain. had 2 large bowel movements. took tylenol and was able to sleep. woke up and abd is still sore, passing gas "feel like a vibration in there". pain of 3/10. pain has been present constantly since last night Does the patient have any new or worsening symptoms? ---Yes Will a triage be completed? ---Yes Related visit to physician within the last 2 weeks? ---No Does the PT have any chronic conditions? (i.e. diabetes, asthma, this includes High risk factors for pregnancy, etc.) ---No Is the patient pregnant or possibly pregnant? (Ask all females between the ages of 70-55) ---No Is this a behavioral health or substance abuse call? ---No Guidelines Guideline Title Affirmed Question Affirmed Notes Nurse Date/Time Eilene Ghazi Time) Abdominal Pain - Female [1] MILDMODERATE pain AND [2]  constant Altamease Oiler, RN, [place] 12/27/2021 8:30:16 AM PLEASE NOTE: All timestamps contained within this report are represented as [place] Standard Time. CONFIDENTIALTY NOTICE: This fax transmission is intended only for the addressee. It contains information that is legally privileged, confidential or otherwise protected from use or disclosure. If you are not the intended recipient, you are strictly prohibited from reviewing, disclosing, copying using or disseminating any of this information or taking any action in reliance on or regarding this information. If you have received this fax in error, please notify us immediately by telephone so that we can arrange for its return to [place]. Phone: [phone], Toll-Free: [phone], Fax: [phone] Page: 2 of 2 Call Id: [id number] Guidelines Guideline Title Affirmed Question Affirmed Notes Nurse Date/Time Eilene Ghazi Time) AND [3] present > 2 hours Disp. Time Eilene Ghazi Time) Disposition Final User 12/27/2021 8:23:52 AM Send to Urgent Loetta Rough 12/27/2021 8:34:13 AM See HCP within 4 Hours (or PCP triage) Yes Altamease Oiler, RN, Fabio Bering Caller Disagree/Comply Comply Caller Understands Yes PreDisposition Call Doctor Care Advice Given Per Guideline SEE HCP (OR PCP TRIAGE) WITHIN 4 HOURS: * IF OFFICE WILL BE OPEN: You need to be seen within the next 3 or 4 hours. Call your doctor (or NP/PA) now or as soon as the office opens. NOTHING BY MOUTH: * Do not eat or drink anything for now. CALL BACK IF: * You become worse CARE ADVICE given per Abdominal Pain, Female (Adult) guideline. Referrals Warm transfer to backlin

## 2021-12-27 NOTE — Telephone Encounter (Signed)
Patient asking for Diclofenac re filled to [facility] in [place]. Last filled on 09/20/20 #112 g no refill LOV 12/27/21 for acute visit with [facility]. CPE with Dr Glori Bickers on 08/21/21.

## 2021-12-27 NOTE — Telephone Encounter (Signed)
Patient evaluated in office 

## 2021-12-27 NOTE — Assessment & Plan Note (Signed)
Incidental finding on UA in office today.  We will send off for microscopy and culture to rule out infection and apparent blood in urine.  Pending both results

## 2021-12-27 NOTE — Telephone Encounter (Signed)
Per appt notes pt has appt to see Romilda Garret NP 12/27/21 at 9:20 and pt has already been arrived. Sending note to Romilda Garret NP.

## 2021-12-27 NOTE — Telephone Encounter (Signed)
Pt called and stated she was experiencing stomach pain and I transferred her to access nurse. She wanted to let Dr. Einar Pheasant know.  Callback Number: [phone]

## 2021-12-27 NOTE — Assessment & Plan Note (Signed)
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  Does sound related to bowel movements we will obtain a abdomen 1 view to assess stool burden and patient.  Also check basic blood work.  Patient does have history of H. pylori we will send off stool antigen to make sure this is not a contributing factor.  Patient is states she is having more gas in the regards of belching and flatus.  If work-up benign consider adding a probiotic or PPI

## 2021-12-28 ENCOUNTER — Other Ambulatory Visit: Payer: No Typology Code available for payment source

## 2021-12-28 DIAGNOSIS — R1084 Generalized abdominal pain: Secondary | ICD-10-CM

## 2021-12-28 LAB — URINE CULTURE
MICRO NUMBER:: 13414661
SPECIMEN QUALITY:: ADEQUATE

## 2021-12-31 ENCOUNTER — Encounter: Payer: Self-pay | Admitting: Nurse Practitioner

## 2021-12-31 LAB — HELICOBACTER PYLORI  SPECIAL ANTIGEN
MICRO NUMBER:: 13420588
SPECIMEN QUALITY: ADEQUATE

## 2022-01-02 ENCOUNTER — Telehealth: Payer: Self-pay | Admitting: Nurse Practitioner

## 2022-01-02 ENCOUNTER — Ambulatory Visit (INDEPENDENT_AMBULATORY_CARE_PROVIDER_SITE_OTHER): Payer: No Typology Code available for payment source

## 2022-01-02 ENCOUNTER — Other Ambulatory Visit: Payer: Self-pay | Admitting: Nurse Practitioner

## 2022-01-02 DIAGNOSIS — R1084 Generalized abdominal pain: Secondary | ICD-10-CM

## 2022-01-02 DIAGNOSIS — Z23 Encounter for immunization: Secondary | ICD-10-CM

## 2022-01-02 LAB — POCT URINALYSIS DIPSTICK
Bilirubin, UA: NEGATIVE
Glucose, UA: NEGATIVE
Ketones, UA: NEGATIVE
Leukocytes, UA: NEGATIVE
Nitrite, UA: NEGATIVE
Protein, UA: NEGATIVE
Spec Grav, UA: 1.015 (ref 1.010–1.025)
Urobilinogen, UA: 0.2 E.U./dL
pH, UA: 6 (ref 5.0–8.0)

## 2022-01-02 NOTE — Telephone Encounter (Signed)
Results are entered.

## 2022-01-02 NOTE — Progress Notes (Signed)
Patient presented for 2nd shingles vaccine given by Darilyn Storbeck, CMA to right deltoid, patient voiced no concerns nor showed any signs of distress during injection.  

## 2022-01-02 NOTE — Telephone Encounter (Signed)
-----   Message from [place] sent at 01/01/2022  4:47 PM EDT ----- Patient advised. Patient states she still has discomfort off and on, she will come by tomorrow 01/02/22 to recollect urine culture

## 2022-01-02 NOTE — Telephone Encounter (Signed)
Orders placed.

## 2022-01-03 ENCOUNTER — Other Ambulatory Visit: Payer: Self-pay

## 2022-01-03 LAB — URINE CULTURE
MICRO NUMBER:: 13439821
SPECIMEN QUALITY:: ADEQUATE

## 2022-01-03 MED ORDER — AZITHROMYCIN 500 MG PO TABS
ORAL_TABLET | ORAL | 0 refills | Status: DC
  Filled 2022-01-03: qty 4, 2d supply, fill #0

## 2022-01-03 MED ORDER — MEFLOQUINE HCL 250 MG PO TABS
ORAL_TABLET | ORAL | 0 refills | Status: DC
  Filled 2022-01-03: qty 11, 77d supply, fill #0

## 2022-01-04 ENCOUNTER — Other Ambulatory Visit: Payer: Self-pay

## 2022-01-11 ENCOUNTER — Other Ambulatory Visit: Payer: Self-pay

## 2022-01-30 ENCOUNTER — Ambulatory Visit: Payer: No Typology Code available for payment source | Admitting: Urology

## 2022-02-21 ENCOUNTER — Other Ambulatory Visit: Payer: Self-pay

## 2022-03-26 ENCOUNTER — Other Ambulatory Visit: Payer: Self-pay

## 2022-03-26 ENCOUNTER — Ambulatory Visit (INDEPENDENT_AMBULATORY_CARE_PROVIDER_SITE_OTHER): Payer: No Typology Code available for payment source | Admitting: Family Medicine

## 2022-03-26 VITALS — BP 100/60 | HR 77 | Temp 97.5°F | Ht 63.0 in | Wt 157.5 lb

## 2022-03-26 DIAGNOSIS — M7120 Synovial cyst of popliteal space [Baker], unspecified knee: Secondary | ICD-10-CM | POA: Diagnosis not present

## 2022-03-26 DIAGNOSIS — H1031 Unspecified acute conjunctivitis, right eye: Secondary | ICD-10-CM | POA: Diagnosis not present

## 2022-03-26 MED ORDER — OLOPATADINE HCL 0.2 % OP SOLN
1.0000 [drp] | Freq: Two times a day (BID) | OPHTHALMIC | 0 refills | Status: DC
  Filled 2022-03-26: qty 2.5, 25d supply, fill #0

## 2022-03-26 MED ORDER — OLOPATADINE HCL 0.2 % OP SOLN
1.0000 [drp] | Freq: Every day | OPHTHALMIC | 0 refills | Status: DC
  Filled 2022-03-26: qty 2.5, 30d supply, fill #0

## 2022-03-26 MED ORDER — POLYMYXIN B-TRIMETHOPRIM 10000-0.1 UNIT/ML-% OP SOLN
1.0000 [drp] | OPHTHALMIC | 0 refills | Status: DC
  Filled 2022-03-26: qty 10, 30d supply, fill #0

## 2022-03-26 MED ORDER — DICLOFENAC SODIUM 75 MG PO TBEC
75.0000 mg | DELAYED_RELEASE_TABLET | Freq: Two times a day (BID) | ORAL | 0 refills | Status: DC
  Filled 2022-03-26: qty 30, 15d supply, fill #0

## 2022-03-26 NOTE — Assessment & Plan Note (Signed)
Suspect allergy versus dry eye, advised lubricating eyedrop.  Antibiotics sent in case this does not improve with time.

## 2022-03-26 NOTE — Progress Notes (Signed)
Subjective:     Jennifer Ware is a 51 y.o. female presenting for Eye Problem (Itching, red, gritty and teary, mostly in the R x 1 week.Marland Kitchen )     Eye Problem  Associated symptoms include eye redness and itching. Pertinent negatives include no fever.    #Eye watering - started tearing up - became red - swelling and itching - has a sensation of pebbles on it - has not tried any OTC mediation - just traveled from [place]  Review of Systems  Constitutional:  Negative for chills and fever.  HENT:  Negative for sinus pressure, sinus pain, sneezing and sore throat.   Eyes:  Positive for pain, redness and itching.  Respiratory:  Negative for cough.      Social History   Tobacco Use  Smoking Status Never  Smokeless Tobacco Never        Objective:    BP Readings from Last 3 Encounters:  03/26/22 100/60  12/27/21 110/76  08/21/21 118/80   Wt Readings from Last 3 Encounters:  03/26/22 157 lb 8 oz (71.4 kg)  12/27/21 150 lb (68 kg)  08/21/21 151 lb 12.8 oz (68.9 kg)    BP 100/60   Pulse 77   Temp (!) 97.5 F (36.4 C) (Temporal)   Ht '5\' 3"'$  (1.6 m)   Wt 157 lb 8 oz (71.4 kg)   LMP 09/04/2018   SpO2 97%   BMI 27.90 kg/m    Physical Exam Constitutional:      General: She is not in acute distress.    Appearance: She is well-developed. She is not diaphoretic.  HENT:     Right Ear: External ear normal.     Left Ear: External ear normal.     Nose: Nose normal.  Eyes:     General: Lids are normal. Lids are everted, no foreign bodies appreciated. Vision grossly intact.     Extraocular Movements: Extraocular movements intact.     Conjunctiva/sclera:     Right eye: Right conjunctiva is injected. No chemosis, exudate or hemorrhage.    Pupils: Pupils are equal, round, and reactive to light.     Right eye: No fluorescein uptake.  Cardiovascular:     Rate and Rhythm: Normal rate.  Pulmonary:     Effort: Pulmonary effort is normal.  Musculoskeletal:     Cervical  back: Neck supple.     Comments: Knee Inspection: posterior swelling b/l Palpation: ttp posterior knee NFA:[id number] flexion but with pain  Skin:    General: Skin is warm and dry.     Capillary Refill: Capillary refill takes less than 2 seconds.  Neurological:     Mental Status: She is alert. Mental status is at baseline.  Psychiatric:        Mood and Affect: Mood normal.        Behavior: Behavior normal.           Assessment & Plan:   Problem List Items Addressed This Visit       Musculoskeletal and Integument   Synovial cyst of knee    She has posterior swelling bilaterally.  No warmth.  Advised course of diclofenac twice daily for 2 weeks if no improvement schedule follow-up with Dr. Lorelei Pont.      Relevant Medications   diclofenac (VOLTAREN) 75 MG EC tablet     Other   Acute conjunctivitis of right eye - Primary    Suspect allergy versus dry eye, advised lubricating eyedrop.  Antibiotics sent in case  this does not improve with time.      Relevant Medications   trimethoprim-polymyxin b (POLYTRIM) ophthalmic solution   Olopatadine HCl 0.2 % SOLN     Return if symptoms worsen or fail to improve.  Lesleigh Noe, MD

## 2022-03-26 NOTE — Assessment & Plan Note (Signed)
She has posterior swelling bilaterally.  No warmth.  Advised course of diclofenac twice daily for 2 weeks if no improvement schedule follow-up with Dr. Lorelei Pont.

## 2022-03-26 NOTE — Patient Instructions (Signed)
Knee pain - likely a baker's cyst - try diclofenac - Dr. Lorelei Pont in 2 weeks if no improvement  Eye - likely dry eyes/allergies - try lubricating drops - antibiotics if not improving

## 2022-03-27 ENCOUNTER — Other Ambulatory Visit: Payer: Self-pay

## 2022-04-16 ENCOUNTER — Encounter: Payer: Self-pay | Admitting: Family Medicine

## 2022-04-16 NOTE — Telephone Encounter (Signed)
Routing to Malo to review as he saw patient.

## 2022-04-18 ENCOUNTER — Ambulatory Visit (INDEPENDENT_AMBULATORY_CARE_PROVIDER_SITE_OTHER): Payer: No Typology Code available for payment source | Admitting: Family Medicine

## 2022-04-18 ENCOUNTER — Other Ambulatory Visit: Payer: Self-pay

## 2022-04-18 VITALS — BP 90/58 | HR 75 | Temp 97.6°F | Wt 155.0 lb

## 2022-04-18 DIAGNOSIS — K219 Gastro-esophageal reflux disease without esophagitis: Secondary | ICD-10-CM | POA: Diagnosis not present

## 2022-04-18 DIAGNOSIS — K5900 Constipation, unspecified: Secondary | ICD-10-CM | POA: Insufficient documentation

## 2022-04-18 MED ORDER — OMEPRAZOLE 20 MG PO CPDR
20.0000 mg | DELAYED_RELEASE_CAPSULE | Freq: Every day | ORAL | 3 refills | Status: DC
  Filled 2022-04-18: qty 30, 30d supply, fill #0

## 2022-04-18 NOTE — Assessment & Plan Note (Signed)
Intermittent and persistent. Worse with recent diclofenac but pt no longer taking. Improved with prn zantac. Neg h pylori testing in May. Start omeprazole 20 mg daily. Referral to GI if symptoms do not improve.

## 2022-04-18 NOTE — Patient Instructions (Addendum)
Omeprazole 20 mg - daily Update if no improvement in 2 weeks  Take for 8 weeks and then try stopping if symptoms return then see GI   Can try GasX for bloating  Constipation   Constipation is a common issue. Often it is related to diet and occasionally medications.    What you can do to treat your symptoms 1) Fiber -- Eat more fiber rich foods: beans, broccoli, berries, avocados, popcorn, pear/apple, green peas, turnip greens, brussels sprouts, whole grains (barley, bran, quinoa, oatmeal) -- Take a Fiber supplement: Psyllium (Metamucil)  -- Could also eat Prunes daily  2) Hydration  -- Drink more water: Try to drink 64 oz of water per day  3) Exercise -- Moderate exercise (walking, jogging, biking) for 30 minutes, 5 days a week  4) Dedicate time for Bowel movements - do not delay  5) Stool Softener  - Docusate Sodium (Colace) 100 mg daily or twice daily as needed   If 4-6 weeks have passed and the above has not helped then start the following 6) Laxatives -- Polyethylene Glycol (Miralax) - begin with once daily. After a few days can increase to twice daily Or -- Magnesium Citrate -- Common side effect is nausea and diarrhea -- can try if still not improved   Treating chronic constipation is often about finding the right amount of medication and fiber to keep you regular and comfortable. For some people that may be daily metamucil and colace every other day. For others it may be Metamucil and colace twice daily and Miralax 3 times a week. The goal is to go slow and listen to your body. And normal can be anywhere from 2-3 soft bowel movements a day to 1 bowel movement every 2-3 days.   Food Choices for Gastroesophageal Reflux Disease, Adult When you have gastroesophageal reflux disease (GERD), the foods you eat and your eating habits are very important. Choosing the right foods can help ease your discomfort. Think about working with a food expert (dietitian) to help you make good  choices. What are tips for following this plan? Reading food labels Look for foods that are low in saturated fat. Foods that may help with your symptoms include: Foods that have less than 5% of daily value (DV) of fat. Foods that have 0 grams of trans fat. Cooking Do not fry your food. Cook your food by baking, steaming, grilling, or broiling. These are all methods that do not need a lot of fat for cooking. To add flavor, try to use herbs that are low in spice and acidity. Meal planning  Choose healthy foods that are low in fat, such as: Fruits and vegetables. Whole grains. Low-fat dairy products. Lean meats, fish, and poultry. Eat small meals often instead of eating 3 large meals each day. Eat your meals slowly in a place where you are relaxed. Avoid bending over or lying down until 2-3 hours after eating. Limit high-fat foods such as fatty meats or fried foods. Limit your intake of fatty foods, such as oils, butter, and shortening. Avoid the following as told by your doctor: Foods that cause symptoms. These may be different for different people. Keep a food diary to keep track of foods that cause symptoms. Alcohol. Drinking a lot of liquid with meals. Eating meals during the 2-3 hours before bed. Lifestyle Stay at a healthy weight. Ask your doctor what weight is healthy for you. If you need to lose weight, work with your doctor to do so safely.  Exercise for at least 30 minutes on 5 or more days each week, or as told by your doctor. Wear loose-fitting clothes. Do not smoke or use any products that contain nicotine or tobacco. If you need help quitting, ask your doctor. Sleep with the head of your bed higher than your feet. Use a wedge under the mattress or blocks under the bed frame to raise the head of the bed. Chew sugar-free gum after meals. What foods should eat?  Eat a healthy, well-balanced diet of fruits, vegetables, whole grains, low-fat dairy products, lean meats, fish,  and poultry. Each person is different. Foods that may cause symptoms in one person may not cause any symptoms in another person. Work with your doctor to find foods that are safe for you. The items listed above may not be a complete list of what you can eat and drink. Contact a food expert for more options. What foods should I avoid? Limiting some of these foods may help in managing the symptoms of GERD. Everyone is different. Talk with a food expert or your doctor to help you find the exact foods to avoid, if any. Fruits Any fruits prepared with added fat. Any fruits that cause symptoms. For some people, this may include citrus fruits, such as oranges, grapefruit, pineapple, and lemons. Vegetables Deep-fried vegetables. [place] fries. Any vegetables prepared with added fat. Any vegetables that cause symptoms. For some people, this may include tomatoes and tomato products, chili peppers, onions and garlic, and horseradish. Grains Pastries or quick breads with added fat. Meats and other proteins High-fat meats, such as fatty beef or pork, hot dogs, ribs, ham, sausage, salami, and bacon. Fried meat or protein, including fried fish and fried chicken. Nuts and nut butters, in large amounts. Dairy Whole milk and chocolate milk. Sour cream. Cream. Ice cream. Cream cheese. Milkshakes. Fats and oils Butter. Margarine. Shortening. Ghee. Beverages Coffee and tea, with or without caffeine. Carbonated beverages. Sodas. Energy drinks. Fruit juice made with acidic fruits, such as orange or grapefruit. Tomato juice. Alcoholic drinks. Sweets and desserts Chocolate and cocoa. Donuts. Seasonings and condiments Pepper. Peppermint and spearmint. Added salt. Any condiments, herbs, or seasonings that cause symptoms. For some people, this may include curry, hot sauce, or vinegar-based salad dressings. The items listed above may not be a complete list of what you should not eat and drink. Contact a food expert for  more options. Questions to ask your doctor Diet and lifestyle changes are often the first steps that are taken to manage symptoms of GERD. If diet and lifestyle changes do not help, talk with your doctor about taking medicines. Where to find more information International Foundation for Gastrointestinal Disorders: aboutgerd.org Summary When you have GERD, food and lifestyle choices are very important in easing your symptoms. Eat small meals often instead of 3 large meals a day. Eat your meals slowly and in a place where you are relaxed. Avoid bending over or lying down until 2-3 hours after eating. Limit high-fat foods such as fatty meats or fried foods. This information is not intended to replace advice given to you by your health care provider. Make sure you discuss any questions you have with your health care provider. Document Revised: 02/07/2020 Document Reviewed: 02/07/2020 Elsevier Patient Education  [place].

## 2022-04-18 NOTE — Assessment & Plan Note (Signed)
KUB in May with signs of constipation.  Notes some abdominal pain that resolves after she passes a bowel movement.  Encouraged high-fiber diet, handout to try a stool softener regularly and/or fiber supplement.  Colonoscopy last year was normal.  GI referral if not improving.

## 2022-04-18 NOTE — Progress Notes (Signed)
Subjective:     Jennifer Ware is a 51 y.o. female presenting for Bloated (Gas, indigestion and heartburn x 1 week)     HPI  #bloating and gas - regular BM - also having reflux - was taking zantac on and off with improvement in symptoms - Sunday ate eggs and had symptoms all day - did take diclofenac and it made everything worse - BM daily - occasionally will have some pain and discomfort prior to BM which is relieved with BM - easily passed BM - took something to help with constipation  Has been trying to eliminate foods but it still comes  Review of Systems  Constitutional:  Negative for unexpected weight change.  Gastrointestinal:  Positive for constipation. Negative for blood in stool, nausea and vomiting.     Social History   Tobacco Use  Smoking Status Never  Smokeless Tobacco Never        Objective:    BP Readings from Last 3 Encounters:  04/18/22 (!) 90/58  03/26/22 100/60  12/27/21 110/76   Wt Readings from Last 3 Encounters:  04/18/22 155 lb (70.3 kg)  03/26/22 157 lb 8 oz (71.4 kg)  12/27/21 150 lb (68 kg)    BP (!) 90/58   Pulse 75   Temp 97.6 F (36.4 C) (Temporal)   Wt 155 lb (70.3 kg)   LMP 09/04/2018   SpO2 98%   BMI 27.46 kg/m    Physical Exam HENT:     Head: Normocephalic and atraumatic.     Nose: Nose normal.  Cardiovascular:     Rate and Rhythm: Normal rate and regular rhythm.     Heart sounds: No murmur heard. Pulmonary:     Effort: Pulmonary effort is normal. No respiratory distress.     Breath sounds: Normal breath sounds. No wheezing.  Abdominal:     General: Abdomen is flat. Bowel sounds are normal. There is no distension.     Palpations: Abdomen is soft.     Tenderness: There is no abdominal tenderness. There is no guarding or rebound.  Skin:    General: Skin is warm and dry.  Neurological:     Mental Status: She is alert. Mental status is at baseline.  Psychiatric:        Mood and Affect: Mood normal.       12/27/2021: Clinic - Abdominal pain - concern for pcos vs constipation. KUB with constipation.      Assessment & Plan:   Problem List Items Addressed This Visit       Digestive   Gastroesophageal reflux disease - Primary    Intermittent and persistent. Worse with recent diclofenac but pt no longer taking. Improved with prn zantac. Neg h pylori testing in May. Start omeprazole 20 mg daily. Referral to GI if symptoms do not improve.       Relevant Medications   omeprazole (PRILOSEC) 20 MG capsule   Other Relevant Orders   Ambulatory referral to Gastroenterology     Other   Constipation    KUB in May with signs of constipation.  Notes some abdominal pain that resolves after she passes a bowel movement.  Encouraged high-fiber diet, handout to try a stool softener regularly and/or fiber supplement.  Colonoscopy last year was normal.  GI referral if not improving.      Relevant Orders   Ambulatory referral to Gastroenterology     Return in about 4 months (around 08/26/2022), or if symptoms worsen or fail to improve,  for TOC - to [place] or [place].  Lesleigh Noe, MD

## 2022-05-01 ENCOUNTER — Ambulatory Visit: Payer: No Typology Code available for payment source | Admitting: Family Medicine

## 2022-05-02 ENCOUNTER — Encounter: Payer: Self-pay | Admitting: Family Medicine

## 2022-05-02 ENCOUNTER — Other Ambulatory Visit: Payer: Self-pay

## 2022-05-02 ENCOUNTER — Ambulatory Visit (INDEPENDENT_AMBULATORY_CARE_PROVIDER_SITE_OTHER): Payer: No Typology Code available for payment source | Admitting: Family Medicine

## 2022-05-02 VITALS — BP 100/70 | HR 80 | Temp 97.9°F | Ht 63.0 in | Wt 155.0 lb

## 2022-05-02 DIAGNOSIS — M25562 Pain in left knee: Secondary | ICD-10-CM | POA: Diagnosis not present

## 2022-05-02 DIAGNOSIS — M7121 Synovial cyst of popliteal space [Baker], right knee: Secondary | ICD-10-CM

## 2022-05-02 DIAGNOSIS — M222X2 Patellofemoral disorders, left knee: Secondary | ICD-10-CM

## 2022-05-02 DIAGNOSIS — M7122 Synovial cyst of popliteal space [Baker], left knee: Secondary | ICD-10-CM

## 2022-05-02 DIAGNOSIS — M222X1 Patellofemoral disorders, right knee: Secondary | ICD-10-CM | POA: Diagnosis not present

## 2022-05-02 MED ORDER — ETODOLAC 500 MG PO TABS
500.0000 mg | ORAL_TABLET | Freq: Two times a day (BID) | ORAL | 1 refills | Status: DC
  Filled 2022-05-02: qty 60, 30d supply, fill #0

## 2022-05-02 NOTE — Progress Notes (Signed)
Jennifer Ware T. Laurine Kuyper, MD, [place] [facility] [place], [address]  Phone: [phone]  FAX: [phone]  Jennifer Ware - 51 y.o. female  MRN [id number]  Date of Birth: 1970-10-17  Date: 05/02/2022  PCP: Lesleigh Noe, MD  Referral: Lesleigh Noe, MD  Chief Complaint  Patient presents with   Joint Swelling    Swelling behind Bilateral Knees   Subjective:   Jennifer Ware is a 51 y.o. very pleasant female patient with Body mass index is 27.46 kg/m. who presents with the following:  The patient presents with swelling B knees.  Pain and tenderness behind both knees.  Not really severe pain, but some pain with squatting and deep flexion.  She presents with pain and some concerns about popliteal fullness on bilateral knees.  She has not had any kind of specific injury at all, she is having some anterior knee pain.  She does have some crepitus with motion with standing.  No prior history of major knee injury, trauma, fracture, or operative intervention.  She has taken some meloxicam before and felt as if this may have caused some UTIs, and this is noted as an allergy.  She has also had difficulty with oral Voltaren.  She has not had any locking up of the joint or any symptomatic giving way.  No falls.  Review of Systems is noted in the HPI, as appropriate  Objective:   BP 100/70   Pulse 80   Temp 97.9 F (36.6 C) (Oral)   Ht '5\' 3"'$  (1.6 m)   Wt 155 lb (70.3 kg)   LMP 09/04/2018   SpO2 97%   BMI 27.46 kg/m   GEN: No acute distress; alert,appropriate. PULM: Breathing comfortably in no respiratory distress PSYCH: Normally interactive.   Bilateral knee exam: Full extension.  No significant effusion.  She does have some pain with loading the medial lateral patellar facets with pain with patellar crepitus with motion.  No patellar apprehension. Stable to varus and valgus stress.  No significant joint  line tenderness.  ACL and PCL are intact. Mild pain with flexion pinch only, no significant McMurray's and no bounce home testing.  Nontender at the pes bursa.  Laboratory and Imaging Data:  Assessment and Plan:     ICD-10-CM   1. Acute pain of left knee  M25.562     2. Popliteal cyst, right  M71.21     3. Popliteal cyst, left  M71.22     4. Patellofemoral pain syndrome of both knees  M22.2X1    M22.2X2      Acute on chronic knee pain and patellofemoral pain with exacerbation.  She does have bilateral Baker's cyst, but I suspect this is all from intra-articular inflammation.  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  Doubt any other intra-articular pathology.  Think she can ice her knees, act do activity as she can and hopefully a course of some oral NSAIDs over the next 2 to 3 weeks will calm down her knees.  If symptoms persist greater than 1 month, we can always do an intra-articular injection, as well.  Medication Management during today's office visit: Meds ordered this encounter  Medications   etodolac (LODINE) 500 MG tablet    Sig: Take 1 tablet (500 mg total) by mouth 2 (two) times daily.    Dispense:  60 tablet    Refill:  1   Medications Discontinued During This  Encounter  Medication Reason   glucosamine-chondroitin 500-400 MG tablet Completed Course   Lecithin 1200 MG CAPS Completed Course    Orders placed today for conditions managed today: No orders of the defined types were placed in this encounter.   Disposition: No follow-ups on file.  Dragon [employer] One speech-to-text software was used for transcription in this dictation.  Possible transcriptional errors can occur using Editor, commissioning.   Signed,  [name]. Gracy Ehly, MD   Outpatient Encounter Medications as of 05/02/2022  Medication Sig   Ascorbic Acid (VITAMIN C) 1000 MG tablet Take 1,000 mg by mouth daily.   BIOTIN 5000 PO Take by mouth daily.   Calcium  600-200 MG-UNIT per tablet Take 2 tablets by mouth daily.   clindamycin (CLEOCIN T) 1 % SWAB Apply 1 application topically in the morning.   cyanocobalamin 1000 MCG tablet Take 100 mcg by mouth daily.   diazepam (VALIUM) 10 MG tablet Take 1 tablet (10 mg total) by mouth as needed (Prior to intercourse). Do not use this medication orally.  Please 1 tablet in the vagina 1 hour prior to intercourse.  Please do not drive or use machinery after medication administration.   Diclofenac Sodium (PENNSAID) 2 % SOLN Apply 1 application. topically daily as needed.   estradiol (ESTRACE) 0.1 MG/GM vaginal cream Estrogen Cream Instruction  Discard applicator  Apply pea sized amount to tip of finger to urethra before bed. Wash hands well after application. Use Monday, Wednesday and Friday   etodolac (LODINE) 500 MG tablet Take 1 tablet (500 mg total) by mouth 2 (two) times daily.   Fluocinolone Acetonide 0.01 % OIL PLACE 2 DROPS TO AFFECTED EAR, NO MORE THAN 1 WEEK AT A TIME   Multiple Vitamins-Minerals (ANTIOXIDANT PO) Take by mouth daily.   omeprazole (PRILOSEC) 20 MG capsule Take 1 capsule (20 mg total) by mouth daily.   tretinoin (RETIN-A) 0.05 % cream Apply topically at bedtime.   AMBULATORY NON FORMULARY MEDICATION Thigh-high, medium compression, graduated compression stockings. Apply to lower extremities. [email or web address], Zippered Compression Stockings, medium circ, long length   [DISCONTINUED] glucosamine-chondroitin 500-400 MG tablet Take 1 tablet by mouth 2 (two) times daily.   [DISCONTINUED] Lecithin 1200 MG CAPS Take 1 capsule by mouth as needed.    Facility-Administered Encounter Medications as of 05/02/2022  Medication   0.9 %  sodium chloride infusion

## 2022-05-06 ENCOUNTER — Other Ambulatory Visit: Payer: Self-pay

## 2022-05-16 ENCOUNTER — Other Ambulatory Visit: Payer: Self-pay

## 2022-05-17 ENCOUNTER — Other Ambulatory Visit: Payer: Self-pay

## 2022-05-30 ENCOUNTER — Telehealth: Payer: Self-pay | Admitting: Family Medicine

## 2022-05-30 DIAGNOSIS — Z1231 Encounter for screening mammogram for malignant neoplasm of breast: Secondary | ICD-10-CM

## 2022-05-30 NOTE — Telephone Encounter (Signed)
Patient called to set up a mammogram. Call back number [phone].

## 2022-05-30 NOTE — Telephone Encounter (Signed)
Informed patient she can call and she schedule. She stated that they will need an order for her to schedule.

## 2022-05-31 NOTE — Telephone Encounter (Signed)
Please let her know that I put in the order and she can go ahead and call to schedule

## 2022-05-31 NOTE — Telephone Encounter (Signed)
My chart message sent to pt.

## 2022-06-03 ENCOUNTER — Ambulatory Visit (INDEPENDENT_AMBULATORY_CARE_PROVIDER_SITE_OTHER): Payer: No Typology Code available for payment source | Admitting: Internal Medicine

## 2022-06-03 ENCOUNTER — Encounter: Payer: Self-pay | Admitting: Internal Medicine

## 2022-06-03 DIAGNOSIS — R140 Abdominal distension (gaseous): Secondary | ICD-10-CM | POA: Diagnosis not present

## 2022-06-03 NOTE — Progress Notes (Signed)
Subjective:    Patient ID: Jennifer Ware, female    DOB: 06/26/1971, 51 y.o.   MRN: [id number]  HPI Here for transfer of care  Was having some bloating---does have appt to see GI Happens after fluids--even water Belches a lot  Does try to minimize dairy intake Does feel like she has some degree of lactose intolerance Does chew a lot of gum---discussed sorbitol  No N/V Tried omeprazole --but it didn't help Bowels were usually fine---symptoms started after bout of poor fecal emptying Now feels this is back to her usual  Current Outpatient Medications on File Prior to Visit  Medication Sig Dispense Refill   AMBULATORY NON FORMULARY MEDICATION Thigh-high, medium compression, graduated compression stockings. Apply to lower extremities. [email or web address], Zippered Compression Stockings, medium circ, long length 2 each 0   Ascorbic Acid (VITAMIN C) 1000 MG tablet Take 1,000 mg by mouth daily.     Calcium 600-200 MG-UNIT per tablet Take 2 tablets by mouth daily.     clindamycin (CLEOCIN T) 1 % SWAB Apply 1 application topically in the morning. 60 each 5   cyanocobalamin 1000 MCG tablet Take 100 mcg by mouth daily.     diazepam (VALIUM) 10 MG tablet Take 1 tablet (10 mg total) by mouth as needed (Prior to intercourse). Do not use this medication orally.  Please 1 tablet in the vagina 1 hour prior to intercourse.  Please do not drive or use machinery after medication administration. 10 tablet 0   Diclofenac Sodium (PENNSAID) 2 % SOLN Apply 1 application. topically daily as needed. 112 g 0   estradiol (ESTRACE) 0.1 MG/GM vaginal cream Estrogen Cream Instruction  Discard applicator  Apply pea sized amount to tip of finger to urethra before bed. Wash hands well after application. Use Monday, Wednesday and Friday 42.5 g 5   etodolac (LODINE) 500 MG tablet Take 1 tablet (500 mg total) by mouth 2 (two) times daily. 60 tablet 1   Fluocinolone Acetonide 0.01 % OIL PLACE 2 DROPS TO AFFECTED  EAR, NO MORE THAN 1 WEEK AT A TIME 20 mL 1   Multiple Vitamins-Minerals (ANTIOXIDANT PO) Take by mouth daily.     omeprazole (PRILOSEC) 20 MG capsule Take 1 capsule (20 mg total) by mouth daily. 30 capsule 3   tretinoin (RETIN-A) 0.05 % cream Apply topically at bedtime. 45 g 5   No current facility-administered medications on file prior to visit.    Allergies  Allergen Reactions   Flagyl [Metronidazole]    Meloxicam    Silver Sulfadiazine    Sulfa Antibiotics    Yeast-Related Products     Past Medical History:  Diagnosis Date   Abdominal pain 10/29/2011   Amenorrhea 10/22/2011   Anxiety    Chronic kidney disease    chronic UTI    Chronic UTI 12/24/2011   Dyspareunia 04/19/2014   Ear discomfort 07/18/2011   GERD (gastroesophageal reflux disease)    HTN (hypertension) 04/25/2014   Hx gestational diabetes    Irregular periods/menstrual cycles 03/04/2014   Knee crepitus 07/27/2014   Low back pain 06/07/2011   Osteoarthritis of both knees 08/07/2016   Otitis externa 06/07/2011   Palpitations    Sciatica of left side 09/01/2017   Sensation of feeling cold 07/27/2014   SI (sacroiliac) joint dysfunction 09/01/2017   Thyroid disease    Viral URI with cough 09/07/2013    Past Surgical History:  Procedure Laterality Date   BREAST BIOPSY Right 08/24/2014   negative, u/s core  CESAREAN SECTION     x3   HERNIA REPAIR     umbilical   UMBILICAL HERNIA REPAIR      Family History  Problem Relation Age of Onset   Asthma Father    Colon cancer Neg Hx    Breast cancer Neg Hx    Colon polyps Neg Hx    Esophageal cancer Neg Hx    Rectal cancer Neg Hx    Stomach cancer Neg Hx     Social History   Socioeconomic History   Marital status: Married    Spouse name: Yaw   Number of children: 3   Years of education: BSN   Highest education level: Not on file  Occupational History   Occupation: LPN    Employer: [employer]  Tobacco Use   Smoking status: Never    Passive exposure:  Never   Smokeless tobacco: Never  Vaping Use   Vaping Use: Never used  Substance and Sexual Activity   Alcohol use: Yes    Comment: once a month    Drug use: No   Sexual activity: Yes    Partners: Male    Birth control/protection: Post-menopausal  Other Topics Concern   Not on file  Social History Narrative   Originally from [place].   Has lived in area for 14 years.   3 children.   Works as a Marine scientist for [employer].        05/09/20   From: [place] - moved to the [place] in 2000   Living: with husband, Yaw (2007) - 2 children at home   Work: [employer] - all the hospital      Family: 3 children - [place], Merrily Pew, Valarie Merino      Enjoys: movies, music, shopping      Exercise: lift weights, walking   Diet: fruit, veggies, country Teacher, music belts: Yes    Guns: No   Safe in relationships: Yes    Social Determinants of Radio broadcast assistant Strain: Not on file  Food Insecurity: Not on file  Transportation Needs: Not on file  Physical Activity: Not on file  Stress: Not on file  Social Connections: Not on file  Intimate Partner Violence: Not on file   Review of Systems Appetite is okay Weight fluctuates some---but fairly stable      Objective:   Physical Exam Constitutional:      Appearance: Normal appearance.  Cardiovascular:     Rate and Rhythm: Normal rate and regular rhythm.     Heart sounds: No murmur heard.    No gallop.  Pulmonary:     Effort: Pulmonary effort is normal.     Breath sounds: Normal breath sounds. No wheezing or rales.  Abdominal:     General: Bowel sounds are normal.     Palpations: Abdomen is soft.     Tenderness: There is no abdominal tenderness. There is no guarding or rebound.  Musculoskeletal:     Cervical back: Neck supple.  Lymphadenopathy:     Cervical: No cervical adenopathy.  Neurological:     Mental Status: She is alert.            Assessment & Plan:

## 2022-06-03 NOTE — Assessment & Plan Note (Signed)
Omeprazole didn't help---stopped Gave info on Low FODMAP diet Cut out the gum at least temporarily Is setting up with GI

## 2022-06-27 NOTE — Progress Notes (Signed)
06/28/2022 Jennifer Ware [id number] [birth year]  Referring provider: Waunita Schooner, MD Primary GI doctor: Dr. Cathleen Corti (Dr. Ardis Hughs)  ASSESSMENT AND PLAN:   Abdominal bloating with belching No alarm symptoms, no weight loss, no AB pain, no regurg/dysphagia.  Negative H pylori with PCP Will treat with pepcid 40 mg, get AB [place] Will check for SIBO -Recommended efforts to decrease air swallowing and provided reassurance that belching is a benign condition.  -Recommend discontinuation of gum chewing, smoking, drinking carbonated beverages, and gulping foods and liquids.  -If any underlying anxiety or depression will reach out to PCP to consider starting treatment.  -Fdguard samples given - breath testing for SIBO +/- empiric treatment with Xifaxan - consider baclofen 10 mg TID if not helping.  - if not improving can consider EGD  Irritable bowel syndrome with constipation 12/2021 KUB with stools, declines repeat.  May benefit from treating constipation/IBS and optimize bowel regiment. Given FODMAP information and discussed diet.  Will check for SIBO for methane producing  Return to clinic in 3 months    Patient Care Team: Venia Carbon, MD as PCP - General (Internal Medicine)  HISTORY OF PRESENT ILLNESS: 51 y.o. female with a past medical history of GERD, generalized abdominal discomfort, constipation, and others listed below presents for evaluation of bloating.  09/04/2020 colonoscopy Dr. Ardis Hughs For screening good prep melanosis and colon, no polyps, recall 10 years 06/03/2022 office visit with PCP for abdominal bloating.  Denies associated nausea vomiting.  Has been minimizing lactose, does chew a lot of gum discussed decreasing sorbitol.  She states in April of this year started to have beltching, bloating.  12/28/2021 negative H pylori stool, no anemia. Normal kidney and liver.  Did trial of prilosec for 30 days without help.  Has been off laculose for years, but  back on cheese, fruit and beans that helped some. Some foods give reflux but this rare.  She has some discomfort, dull achy epigastric pain if not eaten anything.  No dysphagia or painful swallowing. She states she has well formed Bm's, daily. No melena or hematochezia.  Had constipation or incomplete BM.  Does high fiber diet. Denies carbonated beverages. She does drink with straw.  She does chew gum a lot and has stopped gum which helped some.  No weight loss, states weight fluctuates.  Was given etodolac for knee pain/baker's cyst, given etodolac in Sept but this caused AB pain.  History of umblical hernia repair.   04/2021 pelvis US for dysparunia, improved with estrogen therapy.    She  reports that she has never smoked. She has never been exposed to tobacco smoke. She has never used smokeless tobacco. She reports current alcohol use. She reports that she does not use drugs.  Current Medications:      Current Outpatient Medications (Analgesics):    etodolac (LODINE) 500 MG tablet, Take 1 tablet (500 mg total) by mouth 2 (two) times daily.  Current Outpatient Medications (Hematological):    cyanocobalamin 1000 MCG tablet, Take 100 mcg by mouth daily.  Current Outpatient Medications (Other):    AMBULATORY NON FORMULARY MEDICATION, Thigh-high, medium compression, graduated compression stockings. Apply to lower extremities. [email or web address], Zippered Compression Stockings, medium circ, long length   Ascorbic Acid (VITAMIN C) 1000 MG tablet, Take 1,000 mg by mouth daily.   Calcium 600-200 MG-UNIT per tablet, Take 2 tablets by mouth daily.   clindamycin (CLEOCIN T) 1 % SWAB, Apply 1 application topically in the morning.   diazepam (VALIUM) 10  MG tablet, Take 1 tablet (10 mg total) by mouth as needed (Prior to intercourse). Do not use this medication orally.  Please 1 tablet in the vagina 1 hour prior to intercourse.  Please do not drive or use machinery after medication  administration.   Diclofenac Sodium (PENNSAID) 2 % SOLN, Apply 1 application. topically daily as needed.   estradiol (ESTRACE) 0.1 MG/GM vaginal cream, Estrogen Cream Instruction  Discard applicator  Apply pea sized amount to tip of finger to urethra before bed. Wash hands well after application. Use Monday, Wednesday and Friday   famotidine (PEPCID) 40 MG tablet, Take 1 tablet (40 mg total) by mouth at bedtime.   Fluocinolone Acetonide 0.01 % OIL, PLACE 2 DROPS TO AFFECTED EAR, NO MORE THAN 1 WEEK AT A TIME   Multiple Vitamins-Minerals (ANTIOXIDANT PO), Take by mouth daily.   tretinoin (RETIN-A) 0.05 % cream, Apply topically at bedtime.  Medical History:  Past Medical History:  Diagnosis Date   Abdominal pain 10/29/2011   Amenorrhea 10/22/2011   Anxiety    Chronic kidney disease    chronic UTI    Chronic UTI 12/24/2011   Dyspareunia 04/19/2014   Ear discomfort 07/18/2011   GERD (gastroesophageal reflux disease)    HTN (hypertension) 04/25/2014   Hx gestational diabetes    Irregular periods/menstrual cycles 03/04/2014   Knee crepitus 07/27/2014   Low back pain 06/07/2011   Osteoarthritis of both knees 08/07/2016   Otitis externa 06/07/2011   Palpitations    Sciatica of left side 09/01/2017   Sensation of feeling cold 07/27/2014   SI (sacroiliac) joint dysfunction 09/01/2017   Thyroid disease    Viral URI with cough 09/07/2013   Allergies:  Allergies  Allergen Reactions   Flagyl [Metronidazole]    Meloxicam    Silver Sulfadiazine    Sulfa Antibiotics    Yeast-Related Products      Surgical History:  She  has a past surgical history that includes Umbilical hernia repair; Hernia repair; Cesarean section; and Breast biopsy (Right, 08/24/2014). Family History:  Her family history includes Asthma in her father.  REVIEW OF SYSTEMS  : All other systems reviewed and negative except where noted in the History of Present Illness.  PHYSICAL EXAM: BP 100/60 (BP Location: Left Arm, Patient  Position: Sitting, Cuff Size: Normal)   Pulse 70   Ht 5' 2.5" (1.588 m)   Wt 156 lb (70.8 kg)   LMP 09/04/2018   SpO2 100%   BMI 28.08 kg/m  General:   Pleasant, well developed female in no acute distress Head:   Normocephalic and atraumatic. Eyes:  sclerae anicteric,conjunctive pink  Heart:   regular rate and rhythm Pulm:  Clear anteriorly; no wheezing Abdomen:   Soft, Obese AB, Active bowel sounds. No tenderness . , No organomegaly appreciated. Rectal: Not evaluated Extremities:  Without edema. Msk: Symmetrical without gross deformities. Peripheral pulses intact.  Neurologic:  Alert and  oriented x4;  No focal deficits.  Skin:   Dry and intact without significant lesions or rashes. Psychiatric:  Cooperative. Normal mood and affect.    Vladimir Crofts, PA-C 12:10 PM

## 2022-06-28 ENCOUNTER — Encounter: Payer: Self-pay | Admitting: Physician Assistant

## 2022-06-28 ENCOUNTER — Other Ambulatory Visit: Payer: Self-pay

## 2022-06-28 ENCOUNTER — Ambulatory Visit (INDEPENDENT_AMBULATORY_CARE_PROVIDER_SITE_OTHER): Payer: No Typology Code available for payment source | Admitting: Physician Assistant

## 2022-06-28 VITALS — BP 100/60 | HR 70 | Ht 62.5 in | Wt 156.0 lb

## 2022-06-28 DIAGNOSIS — R142 Eructation: Secondary | ICD-10-CM

## 2022-06-28 DIAGNOSIS — K581 Irritable bowel syndrome with constipation: Secondary | ICD-10-CM

## 2022-06-28 DIAGNOSIS — R140 Abdominal distension (gaseous): Secondary | ICD-10-CM

## 2022-06-28 MED ORDER — FAMOTIDINE 40 MG PO TABS
40.0000 mg | ORAL_TABLET | Freq: Every day | ORAL | 1 refills | Status: DC
  Filled 2022-06-28: qty 30, 30d supply, fill #0
  Filled 2023-02-05: qty 30, 30d supply, fill #1

## 2022-06-28 NOTE — Patient Instructions (Addendum)
Remember belching is caused by excessive air swallowing.   Please stop: Eating or drinking too fast  Poorly fitting dentures; not chewing food completely  Carbonated beverages  Chewing gum or sucking on hard candies  Excessive swallowing due to nervous tension or postnasal drip  Forced belching to relieve abdominal discomfort To prevent excessive belching, avoid:  Carbonated beverages  Chewing gum  Hard candies   Simethicone/GasX may be helpful  Can try Florastor probiotic twice a day Can try Fdgard  Will do PEPCID  We may want to evaluate you for small intestinal bacterial overgrowth, this can cause increase gas, bloating, loose stools or constipation.  We are getting a breath test on you to determine if that is the cause.    Recommend starting on a fiber supplement, can try metamucil first but if this causes gas/bloating switch to benefiber or citracel, these do not cause gas.  Take with fiber with with a full 8 oz glass of water once a day. This can take 1 month to start helping, so try for at least one month.  Recommend increasing water and physical activity.   - Drink at least 64-80 ounces of water/liquid per day. - Establish a time to try to move your bowels every day.  For many people, this is after a cup of coffee or after a meal such as breakfast. - Sit all of the way back on the toilet keeping your back fairly straight and while sitting up, try to rest the tops of your forearms on your upper thighs.   - Raising your feet with a step stool/squatty potty can be helpful to improve the angle that allows your stool to pass through the rectum. - Relax the rectum feeling it bulge toward the toilet water.  If you feel your rectum raising toward your body, you are contracting rather than relaxing. - Breathe in and slowly exhale. "Belly breath" by expanding your belly towards your belly button. Keep belly expanded as you gently direct pressure down and back to the anus.  A low pitched  GRRR sound can assist with increasing intra-abdominal pressure.  - Repeat 3-4 times. If unsuccessful, contract the pelvic floor to restore normal tone and get off the toilet.  Avoid excessive straining. - To reduce excessive wiping by teaching your anus to normally contract, place hands on outer aspect of knees and resist knee movement outward.  Hold 5-10 second then place hands just inside of knees and resist inward movement of knees.  Hold 5 seconds.  Repeat a few times each way.  Go to the ER if unable to pass gas, severe AB pain, unable to hold down food, any shortness of breath of chest pain.

## 2022-07-05 ENCOUNTER — Ambulatory Visit (HOSPITAL_COMMUNITY)
Admission: RE | Admit: 2022-07-05 | Discharge: 2022-07-05 | Disposition: A | Discharge status: Home / Self Care | Payer: No Typology Code available for payment source | Source: Ambulatory Visit | Attending: Physician Assistant | Admitting: Physician Assistant

## 2022-07-05 DIAGNOSIS — R142 Eructation: Secondary | ICD-10-CM | POA: Insufficient documentation

## 2022-07-05 DIAGNOSIS — R140 Abdominal distension (gaseous): Secondary | ICD-10-CM | POA: Diagnosis present

## 2022-07-10 ENCOUNTER — Telehealth: Payer: Self-pay | Admitting: Physician Assistant

## 2022-07-10 ENCOUNTER — Other Ambulatory Visit: Payer: Self-pay

## 2022-07-10 NOTE — Telephone Encounter (Signed)
Patient returned call, she was unable to message in to [facility] directly d/t her name not showing up under her care team. She stated her insurance is requiring more information in order to cover SIBO kit. Requesting the importance and if it is medically necessary. Will call insurance company tomorrow to discuss further.

## 2022-07-10 NOTE — Telephone Encounter (Signed)
PT wants to send a message to Vicie Mutters throught my Chart and it says she has to be added as a care partner. Please advise.

## 2022-07-10 NOTE — Telephone Encounter (Signed)
Left message for patient to call back  

## 2022-07-11 NOTE — Telephone Encounter (Signed)
Left message for patient to call back  

## 2022-07-11 NOTE — Telephone Encounter (Signed)
Patient returning call. Please advise. Thank you.

## 2022-07-11 NOTE — Telephone Encounter (Signed)
Spoke with [employer]. They stated that SIBO kit would not be covered under insurance since [facility] is out of network d/t being in another state & since it is a home kit. In order for it to be covered, patient would have to have test completed in office & provider would need to submit a claim form. Patient would also have a $50 copay.

## 2022-07-12 ENCOUNTER — Other Ambulatory Visit: Payer: Self-pay

## 2022-07-12 ENCOUNTER — Encounter: Payer: Self-pay | Admitting: Physician Assistant

## 2022-07-12 MED ORDER — RIFAXIMIN 550 MG PO TABS
550.0000 mg | ORAL_TABLET | Freq: Three times a day (TID) | ORAL | 0 refills | Status: AC
  Filled 2022-07-12 – 2022-07-17 (×3): qty 42, 14d supply, fill #0

## 2022-07-12 NOTE — Telephone Encounter (Signed)
Prescription sent to pharmacy.

## 2022-07-12 NOTE — Telephone Encounter (Signed)
Left message for patient to call back  

## 2022-07-12 NOTE — Telephone Encounter (Signed)
3 x a day for 2 weeks. Was prepopulated, can change it.  Thanks!

## 2022-07-15 ENCOUNTER — Other Ambulatory Visit: Payer: Self-pay

## 2022-07-15 NOTE — Telephone Encounter (Signed)
Patient called states the pharmacy told her the Xifaxan requires PA.

## 2022-07-16 ENCOUNTER — Other Ambulatory Visit (HOSPITAL_COMMUNITY): Payer: Self-pay

## 2022-07-16 ENCOUNTER — Telehealth: Payer: Self-pay

## 2022-07-16 NOTE — Telephone Encounter (Signed)
Pharmacy Patient Advocate Encounter   Received notification from [employer] that prior authorization for XIFAXAN 550 MG TAB is needed.    PA submitted on 07/16/22 Key [id number] Status is pending  Karie Soda, [facility] Patient Advocate Specialist Direct Number: [phone] Fax: [phone]

## 2022-07-17 ENCOUNTER — Other Ambulatory Visit: Payer: Self-pay

## 2022-07-18 ENCOUNTER — Ambulatory Visit: Payer: No Typology Code available for payment source | Admitting: Dermatology

## 2022-07-18 ENCOUNTER — Other Ambulatory Visit (HOSPITAL_COMMUNITY): Payer: Self-pay

## 2022-07-18 NOTE — Telephone Encounter (Signed)
Patient requesting call back to discuss medication PA. Please advise.

## 2022-07-18 NOTE — Telephone Encounter (Signed)
Pharmacy Patient Advocate Encounter  Prior Authorization for Doreene Nest has been approved.    PA# [id number] Effective dates: 07/16/22 through 09/14/22  Pharmacy has processed and pt is aware medication is ready for pick up.   Karie Soda, CPhT Pharmacy Patient Advocate Specialist Direct Number: [phone] Fax: [phone]

## 2022-07-18 NOTE — Telephone Encounter (Signed)
Spoke with patient & she is aware PA was approved, and she should be able to pick up prescription.

## 2022-07-22 NOTE — Progress Notes (Signed)
Agree with the assessment and plan as outlined by Amanda Collier, PA-C. ? ?Geovanny Sartin, DO, FACG ? ?

## 2022-07-29 ENCOUNTER — Ambulatory Visit
Admission: RE | Admit: 2022-07-29 | Discharge: 2022-07-29 | Disposition: A | Discharge status: Home / Self Care | Payer: No Typology Code available for payment source | Source: Ambulatory Visit | Attending: Internal Medicine | Admitting: Internal Medicine

## 2022-07-29 DIAGNOSIS — Z1231 Encounter for screening mammogram for malignant neoplasm of breast: Secondary | ICD-10-CM

## 2022-08-01 ENCOUNTER — Ambulatory Visit: Payer: No Typology Code available for payment source | Admitting: Dermatology

## 2022-09-03 ENCOUNTER — Encounter: Payer: Self-pay | Admitting: Internal Medicine

## 2022-09-03 ENCOUNTER — Ambulatory Visit (INDEPENDENT_AMBULATORY_CARE_PROVIDER_SITE_OTHER): Payer: 59 | Admitting: Internal Medicine

## 2022-09-03 ENCOUNTER — Other Ambulatory Visit: Payer: Self-pay

## 2022-09-03 VITALS — BP 110/70 | HR 90 | Temp 97.7°F | Ht 62.5 in | Wt 154.0 lb

## 2022-09-03 DIAGNOSIS — K589 Irritable bowel syndrome without diarrhea: Secondary | ICD-10-CM | POA: Diagnosis not present

## 2022-09-03 DIAGNOSIS — Z0000 Encounter for general adult medical examination without abnormal findings: Secondary | ICD-10-CM | POA: Diagnosis not present

## 2022-09-03 LAB — CBC
HCT: 41.1 % (ref 36.0–46.0)
Hemoglobin: 14 g/dL (ref 12.0–15.0)
MCHC: 34 g/dL (ref 30.0–36.0)
MCV: 85.5 fl (ref 78.0–100.0)
Platelets: 154 10*3/uL (ref 150.0–400.0)
RBC: 4.81 Mil/uL (ref 3.87–5.11)
RDW: 13.4 % (ref 11.5–15.5)
WBC: 6.8 10*3/uL (ref 4.0–10.5)

## 2022-09-03 LAB — LIPID PANEL
Cholesterol: 181 mg/dL (ref 0–200)
HDL: 62.2 mg/dL (ref >39.00)
LDL Cholesterol: 111 mg/dL — ABNORMAL HIGH (ref 0–99)
NonHDL: 118.62
Total CHOL/HDL Ratio: 3
Triglycerides: 40 mg/dL (ref 0.0–149.0)
VLDL: 8 mg/dL (ref 0.0–40.0)

## 2022-09-03 LAB — COMPREHENSIVE METABOLIC PANEL
ALT: 16 U/L (ref 0–35)
AST: 17 U/L (ref 0–37)
Albumin: 4.3 g/dL (ref 3.5–5.2)
Alkaline Phosphatase: 93 U/L (ref 39–117)
BUN: 14 mg/dL (ref 6–23)
CO2: 30 mEq/L (ref 19–32)
Calcium: 10.1 mg/dL (ref 8.4–10.5)
Chloride: 102 mEq/L (ref 96–112)
Creatinine, Ser: 0.84 mg/dL (ref 0.40–1.20)
GFR: 79.89 mL/min (ref >60.00)
Glucose, Bld: 116 mg/dL — ABNORMAL HIGH (ref 70–99)
Potassium: 3.5 mEq/L (ref 3.5–5.1)
Sodium: 140 mEq/L (ref 135–145)
Total Bilirubin: 0.7 mg/dL (ref 0.2–1.2)
Total Protein: 7.5 g/dL (ref 6.0–8.3)

## 2022-09-03 LAB — VITAMIN D 25 HYDROXY (VIT D DEFICIENCY, FRACTURES): VITD: 78.45 ng/mL (ref 30.00–100.00)

## 2022-09-03 MED ORDER — DICLOFENAC SODIUM 2 % EX SOLN
2.0000 | Freq: Every day | CUTANEOUS | 5 refills | Status: DC | PRN
  Filled 2022-09-03: qty 112, 30d supply, fill #0

## 2022-09-03 NOTE — Assessment & Plan Note (Signed)
Classic IBS If more problems, can review with GI or consider levsin

## 2022-09-03 NOTE — Progress Notes (Signed)
Subjective:    Patient ID: Jennifer Ware, female    DOB: April 01, 1971, 52 y.o.   MRN: [id number]  HPI Here for physical  Did go to GI Did abdominal ultrasound Off omeprazole --now taking pepcid Did take 1 course of xifaxan--it did help some  Appetite is okay Weight stable Just occasional abdominal pain after eating--then relieved by bowel movement No blood with stools  Sees gyn  Current Outpatient Medications on File Prior to Visit  Medication Sig Dispense Refill   AMBULATORY NON FORMULARY MEDICATION Thigh-high, medium compression, graduated compression stockings. Apply to lower extremities. [email or web address], Zippered Compression Stockings, medium circ, long length 2 each 0   Ascorbic Acid (VITAMIN C) 1000 MG tablet Take 1,000 mg by mouth daily.     Calcium 600-200 MG-UNIT per tablet Take 2 tablets by mouth daily.     clindamycin (CLEOCIN T) 1 % SWAB Apply 1 application topically in the morning. 60 each 5   cyanocobalamin 1000 MCG tablet Take 100 mcg by mouth daily.     diazepam (VALIUM) 10 MG tablet Take 1 tablet (10 mg total) by mouth as needed (Prior to intercourse). Do not use this medication orally.  Please 1 tablet in the vagina 1 hour prior to intercourse.  Please do not drive or use machinery after medication administration. 10 tablet 0   Diclofenac Sodium (PENNSAID) 2 % SOLN Apply 1 application. topically daily as needed. 112 g 0   estradiol (ESTRACE) 0.1 MG/GM vaginal cream Estrogen Cream Instruction  Discard applicator  Apply pea sized amount to tip of finger to urethra before bed. Wash hands well after application. Use Monday, Wednesday and Friday 42.5 g 5   famotidine (PEPCID) 40 MG tablet Take 1 tablet (40 mg total) by mouth at bedtime. 30 tablet 1   Fluocinolone Acetonide 0.01 % OIL PLACE 2 DROPS TO AFFECTED EAR, NO MORE THAN 1 WEEK AT A TIME 20 mL 1   Multiple Vitamins-Minerals (ANTIOXIDANT PO) Take by mouth daily.     No current facility-administered  medications on file prior to visit.    Allergies  Allergen Reactions   Flagyl [Metronidazole]    Lactose Intolerance (Gi)    Meloxicam    Silver Sulfadiazine    Sulfa Antibiotics    Yeast-Related Products     Past Medical History:  Diagnosis Date   Abdominal pain 10/29/2011   Amenorrhea 10/22/2011   Anxiety    Chronic kidney disease    chronic UTI    Chronic UTI 12/24/2011   Dyspareunia 04/19/2014   Ear discomfort 07/18/2011   GERD (gastroesophageal reflux disease)    HTN (hypertension) 04/25/2014   Hx gestational diabetes    Irregular periods/menstrual cycles 03/04/2014   Knee crepitus 07/27/2014   Low back pain 06/07/2011   Osteoarthritis of both knees 08/07/2016   Otitis externa 06/07/2011   Palpitations    Sciatica of left side 09/01/2017   Sensation of feeling cold 07/27/2014   SI (sacroiliac) joint dysfunction 09/01/2017   Thyroid disease    Viral URI with cough 09/07/2013    Past Surgical History:  Procedure Laterality Date   BREAST BIOPSY Right 08/24/2014   negative, u/s core   CESAREAN SECTION     x3   HERNIA REPAIR     umbilical   UMBILICAL HERNIA REPAIR      Family History  Problem Relation Age of Onset   Asthma Father    Colon cancer Neg Hx    Breast cancer Neg Hx  Colon polyps Neg Hx    Esophageal cancer Neg Hx    Rectal cancer Neg Hx    Stomach cancer Neg Hx     Social History   Socioeconomic History   Marital status: Married    Spouse name: Yaw   Number of children: 3   Years of education: BSN   Highest education level: Not on file  Occupational History   Occupation: Programmer, multimedia: [employer]  Tobacco Use   Smoking status: Never    Passive exposure: Never   Smokeless tobacco: Never  Vaping Use   Vaping Use: Never used  Substance and Sexual Activity   Alcohol use: Not Currently    Comment: once a month    Drug use: No   Sexual activity: Yes    Partners: Male    Birth control/protection: Post-menopausal  Other Topics Concern    Not on file  Social History Narrative   Originally from [place].   Has lived in area for 14 years.   3 children.   Works as a Marine scientist for [employer].        05/09/20   From: [place] - moved to the [place] in 2000   Living: with husband, Yaw (2007) - 2 children at home   Work: [employer] - all the hospital      Family: 3 children - [place], Merrily Pew, Valarie Merino      Enjoys: movies, music, shopping      Exercise: lift weights, walking   Diet: fruit, veggies, country Teacher, music belts: Yes    Guns: No   Safe in relationships: Yes    Social Determinants of Radio broadcast assistant Strain: Not on file  Food Insecurity: Not on file  Transportation Needs: Not on file  Physical Activity: Not on file  Stress: Not on file  Social Connections: Not on file  Intimate Partner Violence: Not on file   Review of Systems  Constitutional:  Negative for fatigue and unexpected weight change.       Exercises regularly  Wears seat belt  HENT:  Negative for dental problem, hearing loss and tinnitus.        Keeps up with dentist  Eyes:  Negative for visual disturbance.       No diplopia or unilateral vision loss  Respiratory:  Negative for cough, chest tightness and shortness of breath.   Cardiovascular:  Negative for chest pain and leg swelling.       Occ skipped beat--with nerves  Gastrointestinal:  Positive for abdominal pain. Negative for blood in stool.  Endocrine: Negative for polydipsia and polyuria.  Genitourinary:  Positive for dyspareunia. Negative for dysuria and hematuria.       Vaginal estrogen helps  Musculoskeletal:  Negative for back pain and joint swelling.       Mild knee pain--no meds and not limiting  Skin:  Negative for rash.  Allergic/Immunologic: Positive for environmental allergies. Negative for immunocompromised state.       Some itchy eyes--no Rx  Neurological:  Negative for dizziness, syncope, light-headedness and headaches.  Hematological:  Negative for adenopathy. Does not  bruise/bleed easily.  Psychiatric/Behavioral:  Negative for dysphoric mood and sleep disturbance. The patient is not nervous/anxious.        Objective:   Physical Exam Constitutional:      Appearance: Normal appearance.  HENT:     Mouth/Throat:     Pharynx: No oropharyngeal exudate or posterior oropharyngeal  erythema.  Eyes:     Conjunctiva/sclera: Conjunctivae normal.     Pupils: Pupils are equal, round, and reactive to light.  Cardiovascular:     Rate and Rhythm: Normal rate and regular rhythm.     Heart sounds: No murmur heard.    No gallop.  Pulmonary:     Effort: Pulmonary effort is normal.     Breath sounds: Normal breath sounds. No wheezing or rales.  Abdominal:     General: Bowel sounds are normal.     Palpations: Abdomen is soft.     Tenderness: There is no abdominal tenderness. There is no guarding or rebound.  Musculoskeletal:     Cervical back: Neck supple.     Right lower leg: No edema.     Left lower leg: No edema.  Lymphadenopathy:     Cervical: No cervical adenopathy.  Skin:    Findings: No lesion or rash.  Neurological:     General: No focal deficit present.     Mental Status: She is alert and oriented to person, place, and time.  Psychiatric:        Mood and Affect: Mood normal.        Behavior: Behavior normal.            Assessment & Plan:

## 2022-09-03 NOTE — Assessment & Plan Note (Signed)
Healthy Exercises regularly Colon due again 2032 Mammogram yearly--just had Pap per gyn No COVID vaccine for now Had flu vaccine

## 2022-09-05 ENCOUNTER — Encounter: Payer: Self-pay | Admitting: Dermatology

## 2022-09-12 ENCOUNTER — Other Ambulatory Visit: Payer: Self-pay

## 2022-09-12 ENCOUNTER — Encounter: Payer: Self-pay | Admitting: Dermatology

## 2022-09-12 ENCOUNTER — Ambulatory Visit (INDEPENDENT_AMBULATORY_CARE_PROVIDER_SITE_OTHER): Payer: 59 | Admitting: Dermatology

## 2022-09-12 VITALS — BP 110/70

## 2022-09-12 DIAGNOSIS — L811 Chloasma: Secondary | ICD-10-CM | POA: Diagnosis not present

## 2022-09-12 DIAGNOSIS — L700 Acne vulgaris: Secondary | ICD-10-CM | POA: Diagnosis not present

## 2022-09-12 MED ORDER — TRETINOIN 0.1 % EX CREA
1.0000 | TOPICAL_CREAM | Freq: Every day | CUTANEOUS | 2 refills | Status: DC
  Filled 2022-09-12: qty 45, 30d supply, fill #0
  Filled 2023-01-17: qty 45, 30d supply, fill #1

## 2022-09-12 NOTE — Progress Notes (Signed)
   Follow-Up Visit   Subjective  Jennifer Ware is a 52 y.o. female who presents for the following: hyperpigmentation (Patient currently using clindamycin, tretinoin 0.05% and hypochlorous spray to treat acne. ).  Patient pleased with current treatment.   The following portions of the chart were reviewed this encounter and updated as appropriate:   Tobacco  Allergies  Meds  Problems  Med Hx  Surg Hx  Fam Hx      Review of Systems:  No other skin or systemic complaints except as noted in HPI or Assessment and Plan.  Objective  Well appearing patient in no apparent distress; mood and affect are within normal limits.  A focused examination was performed including face. Relevant physical exam findings are noted in the Assessment and Plan.  face rare open comedones  face Reticulated hyperpigmented patches.     Assessment & Plan  Acne vulgaris face  Chronic condition with duration or expected duration over one year. Currently well-controlled.  Continue tretinoin increasing to 0.1% cream nightly as tolerated. Continue clindamycin every morning.  Continue hypochlorous spray once daily.   Topical retinoid medications like tretinoin/Retin-A, adapalene/Differin, tazarotene/Fabior, and Epiduo/Epiduo Forte can cause dryness and irritation when first started. Only apply a pea-sized amount to the entire affected area. Avoid applying it around the eyes, edges of mouth and creases at the nose. If you experience irritation, use a good moisturizer first and/or apply the medicine less often. If you are doing well with the medicine, you can increase how often you use it until you are applying every night. Be careful with sun protection while using this medication as it can make you sensitive to the sun. This medicine should not be used by pregnant women.    tretinoin (RETIN-A) 0.1 % cream - face Apply 1 Application topically at bedtime.  Related Medications clindamycin (CLEOCIN T) 1 %  SWAB Apply 1 application topically in the morning.  Melasma face  Chronic and persistent condition with duration or expected duration over one year. Condition is bothersome/symptomatic for patient. Currently flared.  Continue daily sunscreen  Will prescribe Skin Medicinals Hydroquinone 12%/kojic acid/vitamin C cream pea sized amount twice daily to the entire face for up to 3 months. This cannot be used more than 3 months due to risk of exogenous ochronosis (permanent dark spots). The patient was advised this is not covered by insurance since it is made by a compounding pharmacy. They will receive an email to check out and the medication will be mailed to their home.   Recommend [facility] Tranexamic Acid nightly May add a separate Vitamin C cream when not using the [employer] Hydroquinone.    Return in about 3 months (around 12/11/2022) for acne.  Graciella Belton, RMA, am acting as scribe for Forest Gleason, MD . Documentation: I have reviewed the above documentation for accuracy and completeness, and I agree with the above.  Forest Gleason, MD

## 2022-09-12 NOTE — Patient Instructions (Addendum)
At bedtime - Hydroquinone (for 3 months) Moisturizer Tretinoin Tranexamic acid Moisturizer   May add a separate Vitamin C when not using the [facility] Hydroquinone. Paula's Choice, Alastin, The Ordinary  Will prescribe [facility] Hydroquinone 12%/kojic acid/vitamin C cream pea sized amount twice daily to the entire face for up to 3 months. This cannot be used more than 3 months due to risk of exogenous ochronosis (permanent dark spots). The patient was advised this is not covered by insurance since it is made by a compounding pharmacy. They will receive an email to check out and the medication will be mailed to their home.   Instructions for [facility] Medications  One or more of your medications was sent to the [facility] mail order compounding pharmacy. You will receive an email from them and can purchase the medicine through that link. It will then be mailed to your home at the address you confirmed. If for any reason you do not receive an email from them, please check your spam folder. If you still do not find the email, please let us know. [facility] phone number is [phone].  Topical retinoid medications like tretinoin/Retin-A, adapalene/Differin, tazarotene/Fabior, and Epiduo/Epiduo Forte can cause dryness and irritation when first started. Only apply a pea-sized amount to the entire affected area. Avoid applying it around the eyes, edges of mouth and creases at the nose. If you experience irritation, use a good moisturizer first and/or apply the medicine less often. If you are doing well with the medicine, you can increase how often you use it until you are applying every night. Be careful with sun protection while using this medication as it can make you sensitive to the sun. This medicine should not be used by pregnant women.   Recommend taking Heliocare sun protection supplement daily in sunny weather for additional sun protection. For maximum protection on  the sunniest days, you can take up to 2 capsules of regular Heliocare OR take 1 capsule of Heliocare Ultra. For prolonged exposure (such as a full day in the sun), you can repeat your dose of the supplement 4 hours after your first dose. Heliocare can be purchased at [employer], at some [facility] or at [email or web address].    Due to recent changes in healthcare laws, you may see results of your pathology and/or laboratory studies on MyChart before the doctors have had a chance to review them. We understand that in some cases there may be results that are confusing or concerning to you. Please understand that not all results are received at the same time and often the doctors may need to interpret multiple results in order to provide you with the best plan of care or course of treatment. Therefore, we ask that you please give [place] 2 business days to thoroughly review all your results before contacting the office for clarification. Should we see a critical lab result, you will be contacted sooner.   If You Need Anything After Your Visit  If you have any questions or concerns for your doctor, please call our main line at [phone] and press option 4 to reach your doctor's medical assistant. If no one answers, please leave a voicemail as directed and we will return your call as soon as possible. Messages left after 4 pm will be answered the following business day.   You may also send [place] a message via [place]. We typically respond to MyChart messages within 1-2 business days.  For prescription refills, please ask your  pharmacy to contact our office. Our fax number is [phone].  If you have an urgent issue when the clinic is closed that cannot wait until the next business day, you can page your doctor at the number below.    Please note that while we do our best to be available for urgent issues outside of office hours, we are not available 24/7.   If you have an urgent issue and are unable  to reach [place], you may choose to seek medical care at your doctor's office, retail clinic, urgent care center, or emergency room.  If you have a medical emergency, please immediately call 911 or go to the emergency department.  Pager Numbers  - Dr. Nehemiah Massed: [phone]  - Dr. Laurence Ferrari: [phone]  - Dr. Nicole Kindred: [phone]  In the event of inclement weather, please call our main line at [phone] for an update on the status of any delays or closures.  Dermatology Medication Tips: Please keep the boxes that topical medications come in in order to help keep track of the instructions about where and how to use these. Pharmacies typically print the medication instructions only on the boxes and not directly on the medication tubes.   If your medication is too expensive, please contact our office at [phone] option 4 or send [place] a message through [place].   We are unable to tell what your co-pay for medications will be in advance as this is different depending on your insurance coverage. However, we may be able to find a substitute medication at lower cost or fill out paperwork to get insurance to cover a needed medication.   If a prior authorization is required to get your medication covered by your insurance company, please allow [place] 1-2 business days to complete this process.  Drug prices often vary depending on where the prescription is filled and some pharmacies may offer cheaper prices.  The website www.goodrx.com contains coupons for medications through different pharmacies. The prices here do not account for what the cost may be with help from insurance (it may be cheaper with your insurance), but the website can give you the price if you did not use any insurance.  - You can print the associated coupon and take it with your prescription to the pharmacy.  - You may also stop by our office during regular business hours and pick up a GoodRx coupon card.  - If you need your  prescription sent electronically to a different pharmacy, notify our office through [facility] or by phone at [phone] option 4.     Si Usted Necesita Algo Despus de Su Visita  Tambin puede enviarnos un mensaje a travs de Pharmacist, community. Por lo general respondemos a los mensajes de MyChart en el transcurso de 1 a 2 das hbiles.  Para renovar recetas, por favor pida a su farmacia que se ponga en contacto con nuestra oficina. Harland Dingwall de fax es [place] [phone].  Si tiene un asunto urgente cuando la clnica est cerrada y que no puede esperar hasta el siguiente da hbil, puede llamar/localizar a su doctor(a) al nmero que aparece a continuacin.   Por favor, tenga en cuenta que aunque hacemos todo lo posible para estar disponibles para asuntos urgentes fuera del horario de [place], no estamos disponibles las 24 horas del da, los 7 das de la [place].   Si tiene un problema urgente y no puede comunicarse con nosotros, puede optar por buscar atencin mdica  en el consultorio de su doctor(a), en  una clnica privada, en un centro de atencin urgente o en una sala de emergencias.  Si tiene Engineering geologist, por favor llame inmediatamente al 911 o vaya a la sala de emergencias.  Nmeros de bper  - Dr. Nehemiah Massed: [phone]  - Dra. Moye: [phone]  - Dra. Nicole Kindred: [phone]  En caso de inclemencias del [place], por favor llame a Johnsie Kindred principal al [phone] para una actualizacin sobre el [place] de cualquier retraso o cierre.  Consejos para la medicacin en dermatologa: Por favor, guarde las cajas en las que vienen los medicamentos de uso tpico para ayudarle a seguir las instrucciones sobre dnde y cmo usarlos. Las farmacias generalmente imprimen las instrucciones del medicamento slo en las cajas y no directamente en los tubos del [place].   Si su medicamento es muy caro, por favor, pngase en contacto con Zigmund Daniel llamando al [phone]  y presione la opcin 4 o envenos un mensaje a travs de Pharmacist, community.   No podemos decirle cul ser su copago por los medicamentos por adelantado ya que esto es diferente dependiendo de la cobertura de su seguro. Sin embargo, es posible que podamos encontrar un medicamento sustituto a Electrical engineer un formulario para que el seguro cubra el medicamento que se considera necesario.   Si se requiere una autorizacin previa para que su compaa de seguros [place] su medicamento, por favor permtanos de 1 a 2 das hbiles para completar este proceso.  Los precios de los medicamentos varan con frecuencia dependiendo del Environmental consultant de dnde se surte la receta y alguna farmacias pueden ofrecer precios ms baratos.  El sitio web www.goodrx.com tiene cupones para medicamentos de Airline pilot. Los precios aqu no tienen en cuenta lo que podra costar con la ayuda del seguro (puede ser ms barato con su seguro), pero el sitio web puede darle el precio si no utiliz Research scientist (physical sciences).  - Puede imprimir el cupn correspondiente y llevarlo con su receta a la farmacia.  - Tambin puede pasar por nuestra oficina durante el horario de atencin regular y Charity fundraiser una tarjeta de cupones de GoodRx.  - Si necesita que su receta se enve electrnicamente a una farmacia diferente, informe a nuestra oficina a travs de MyChart de [place] o por telfono llamando al [phone] y presione la opcin 4.

## 2022-09-24 ENCOUNTER — Encounter: Payer: Self-pay | Admitting: Dermatology

## 2022-10-04 ENCOUNTER — Telehealth: Payer: Self-pay

## 2022-10-04 NOTE — Telephone Encounter (Signed)
[place] Night - Client Nonclinical Telephone Record  [facility]urse Client [facility] Night - Client Client Site [place] - Night Provider Viviana Simpler- MD Contact Type Call Who Is Calling Patient / Member / Family / [place] Name [facility] Provider Services Caller Phone Number [phone] Patient Name Jennifer Ware Patient DOB 07/04/1971 Call Type Message Only Information Provided Reason for Call Billing Question Initial Comment Caller states needs billing for Theo Pollins - Has questions about the bill 09/03/2022 routine physical but no codes for the physical. Needs codes for physical to be attached to labs. for proper coverage. Please call [employer]. Disp. Time Disposition Final User 10/03/2022 5:26:55 PM General Information Provided Yes Lawernce Keas Call Closed By: Lawernce Keas Transaction Date/Time: 10/03/2022 5:20:23 PM (ET

## 2022-11-05 ENCOUNTER — Other Ambulatory Visit: Payer: Self-pay

## 2022-11-05 ENCOUNTER — Emergency Department (HOSPITAL_COMMUNITY)
Admission: EM | Admit: 2022-11-05 | Discharge: 2022-11-05 | Disposition: A | Discharge status: Home / Self Care | Payer: PRIVATE HEALTH INSURANCE | Attending: Emergency Medicine | Admitting: Emergency Medicine

## 2022-11-05 ENCOUNTER — Emergency Department (HOSPITAL_COMMUNITY): Payer: 59

## 2022-11-05 ENCOUNTER — Encounter (HOSPITAL_COMMUNITY): Payer: Self-pay

## 2022-11-05 DIAGNOSIS — R519 Headache, unspecified: Secondary | ICD-10-CM | POA: Insufficient documentation

## 2022-11-05 NOTE — Discharge Instructions (Addendum)
It was a pleasure taking care of you today!   Your CT scan didn't show any concerning findings at this time. At home you may take over the counter 500 mg tylenol every 6 hours and alternate with 600 mg ibuprofen every 6 hours for no more than 7 days. Ensure to maintain fluid intake. Follow up with your primary care provider regarding todays ED visit. Return to the ED if you are experiencing increasing/worsening symptoms.

## 2022-11-05 NOTE — ED Triage Notes (Signed)
Pt reports on 2/20 was hit head with a patients leg while at work, pt is a Marine scientist. Pt reports intermittent headache since Denies n/v. Denies neck pain. Denies blood thinner usage.

## 2022-11-05 NOTE — ED Provider Notes (Signed)
[facility] Provider Note   CSN: [id number] Arrival date & time: 11/05/22  1213     History  Chief Complaint  Patient presents with   Headache    Jennifer Ware is a 52 y.o. female who presents to the ED with concerns for headache status post head injury onset 10/01/22. She was at work she hit with the patient's leg approximately 1 month ago.  Notes that she has had intermittent headaches since.  Denies headache currently however notes that she has sharp pain intermittently.  Denies abdominal pain, dizziness, blurred vision, nausea, vomiting.  Denies anticoagulant use at this time.  Has not informed her primary care provider of this..   The history is provided by the patient. No language interpreter was used.       Home Medications Prior to Admission medications   Medication Sig Start Date End Date Taking? Authorizing Provider  AMBULATORY NON FORMULARY MEDICATION Thigh-high, medium compression, graduated compression stockings. Apply to lower extremities. [email or web address], Zippered Compression Stockings, medium circ, long length 08/18/19   Lucille Passy, MD  Ascorbic Acid (VITAMIN C) 1000 MG tablet Take 1,000 mg by mouth daily.    [provider]  Calcium 600-200 MG-UNIT per tablet Take 2 tablets by mouth daily.    [provider]  clindamycin (CLEOCIN T) 1 % SWAB Apply 1 application topically in the morning. 07/26/21   Moye, [place], MD  cyanocobalamin 1000 MCG tablet Take 100 mcg by mouth daily.    [provider]  diazepam (VALIUM) 10 MG tablet Take 1 tablet (10 mg total) by mouth as needed (Prior to intercourse). Do not use this medication orally.  Please 1 tablet in the vagina 1 hour prior to intercourse.  Please do not drive or use machinery after medication administration. 03/28/21   Hollice Espy, MD  diclofenac Sodium (PENNSAID) 2 % SOLN Apply 2 Pump (40 mg total) topically daily as needed. 09/03/22    Venia Carbon, MD  estradiol (ESTRACE) 0.1 MG/GM vaginal cream Estrogen Cream Instruction  Discard applicator  Apply pea sized amount to tip of finger to urethra before bed. Wash hands well after application. Use Monday, Wednesday and Friday 08/01/21   Hollice Espy, MD  famotidine (PEPCID) 40 MG tablet Take 1 tablet (40 mg total) by mouth at bedtime. 06/28/22   Vladimir Crofts, PA-C  Fluocinolone Acetonide 0.01 % OIL PLACE 2 DROPS TO AFFECTED EAR, NO MORE THAN 1 WEEK AT A TIME 10/04/21   Waunita Schooner, MD  Multiple Vitamins-Minerals (ANTIOXIDANT PO) Take by mouth daily.    [provider]  tretinoin (RETIN-A) 0.1 % cream Apply 1 Application topically at bedtime. 09/12/22 09/12/23  Moye, [place], MD      Allergies    Flagyl [metronidazole], Lactose intolerance (gi), Meloxicam, Silver sulfadiazine, Sulfa antibiotics, and Yeast-related products    Review of Systems   Review of Systems  Neurological:  Positive for headaches.  All other systems reviewed and are negative.   Physical Exam Updated Vital Signs BP (!) 134/98 (BP Location: Right Arm)   Pulse 69   Temp 98 F (36.7 C) (Oral)   Resp 18   Wt 69 kg   LMP 09/04/2018   SpO2 100%   BMI 27.38 kg/m  Physical Exam Vitals and nursing note reviewed.  Constitutional:      General: She is not in acute distress.    Appearance: She is not diaphoretic.  HENT:     Head: Normocephalic  and atraumatic.     Mouth/Throat:     Pharynx: No oropharyngeal exudate.  Eyes:     General: No scleral icterus.    Conjunctiva/sclera: Conjunctivae normal.  Cardiovascular:     Rate and Rhythm: Normal rate and regular rhythm.     Pulses: Normal pulses.     Heart sounds: Normal heart sounds.  Pulmonary:     Effort: Pulmonary effort is normal. No respiratory distress.     Breath sounds: Normal breath sounds. No wheezing.  Abdominal:     General: Bowel sounds are normal.     Palpations: Abdomen is soft. There is no mass.      Tenderness: There is no abdominal tenderness. There is no guarding or rebound.  Musculoskeletal:        General: Normal range of motion.     Cervical back: Normal range of motion and neck supple.  Skin:    General: Skin is warm and dry.  Neurological:     Mental Status: She is alert.     Comments: No focal neurological deficits. Negative pronator drift. Able to ambulate without assistance or difficulty. Strength and sensation intact to BUE and BLE. Grip strength 5/5 bilaterally.   Psychiatric:        Behavior: Behavior normal.     ED Results / Procedures / Treatments   Labs (all labs ordered are listed, but only abnormal results are displayed) Labs Reviewed - No data to display  EKG None  Radiology CT Head Wo Contrast  Result Date: 11/05/2022 CLINICAL DATA:  Trauma EXAM: CT HEAD WITHOUT CONTRAST TECHNIQUE: Contiguous axial images were obtained from the base of the skull through the vertex without intravenous contrast. RADIATION DOSE REDUCTION: This exam was performed according to the departmental dose-optimization program which includes automated exposure control, adjustment of the mA and/or kV according to patient size and/or use of iterative reconstruction technique. COMPARISON:  None Available. FINDINGS: Brain: No evidence of acute infarction, hemorrhage, hydrocephalus, extra-axial collection or mass lesion/mass effect. Partially empty sella. Vascular: No hyperdense vessel or unexpected calcification. Skull: Normal. Negative for fracture or focal lesion. Sinuses/Orbits: No middle ear or mastoid effusion. Paranasal sinuses are clear. Orbits are poorly imaged, but visualized portions are unremarkable. Other: None IMPRESSION: No CT evidence of intracranial injury. Electronically Signed   By: Marin Roberts M.D.   On: 11/05/2022 14:56    Procedures Procedures    Medications Ordered in ED Medications - No data to display  ED Course/ Medical Decision Making/ A&P Clinical Course as of  11/05/22 1512  Tue Nov 05, 2022  1511 Discussed with patient discharge treatment plan. Answered all available questions. Pt appears safe for discharge at this time.  [SB]    Clinical Course User Index [SB] Ashton Sabine A, PA-C                             Medical Decision Making Amount and/or Complexity of Data Reviewed Radiology: ordered.   Pt presented to the ED with intermittent headache onset 1 month.  Patient afebrile.  On exam patient without focal neurological deficits.  No vision changes.  No neck pain.  Able to ambulate without assistive difficulty.  Differential diagnosis includes SAH, ICH, migraine, tension headache.  Imaging: I ordered imaging studies including CT head I independently visualized and interpreted imaging which showed: No acute findings I agree with the radiologist interpretation   Disposition: Presentation suspicious for headache. Presentation less likely due to  SAH or ICH due to the absence of red flags. After consideration of the diagnostic results and the patients response to treatment, I feel that the patient would benefit from Discharge home.  Instructed patient to follow-up with her primary care provider regarding today's ED visit.  Supportive care measures and strict return precautions discussed with patient.  Patient knowledges and verbalized understanding.  Patient agreeable to discharge treatment plan. Patient appears safe for discharge at this time.  Follow-up as indicated in the discharge paperwork.   This chart was dictated using voice recognition software, Dragon. Despite the best efforts of this provider to proofread and correct errors, errors may still occur which can change documentation meaning.   Final Clinical Impression(s) / ED Diagnoses Final diagnoses:  Bad headache    Rx / DC Orders ED Discharge Orders     None         Laelia Angelo A, PA-C 11/05/22 1512    Blanchie Dessert, MD 11/05/22 1654

## 2022-12-12 ENCOUNTER — Ambulatory Visit: Payer: 59 | Admitting: Dermatology

## 2022-12-12 ENCOUNTER — Other Ambulatory Visit: Payer: Self-pay

## 2022-12-12 ENCOUNTER — Encounter: Payer: Self-pay | Admitting: Dermatology

## 2022-12-12 VITALS — BP 123/85 | HR 71

## 2022-12-12 DIAGNOSIS — L700 Acne vulgaris: Secondary | ICD-10-CM | POA: Diagnosis not present

## 2022-12-12 DIAGNOSIS — L811 Chloasma: Secondary | ICD-10-CM | POA: Diagnosis not present

## 2022-12-12 MED ORDER — AZELAIC ACID 15 % EX GEL
CUTANEOUS | 3 refills | Status: DC
  Filled 2022-12-12: qty 50, 30d supply, fill #0
  Filled 2023-03-04: qty 50, 30d supply, fill #1
  Filled 2023-06-11: qty 50, 30d supply, fill #2

## 2022-12-12 NOTE — Patient Instructions (Addendum)
Acne: Continue tretinoin increasing to 0.1% cream nightly as tolerated. Start Azelaic acid twice daily Continue hypochlorous spray once daily. Stop Clindamycin.   Melasma: Will call with Tranexamic Acid dosage and send to pharmacy. Stop Skin Medicinals compounded cream for now.   Recommend taking Heliocare sun protection supplement daily in sunny weather for additional sun protection. For maximum protection on the sunniest days, you can take up to 2 capsules of regular Heliocare OR take 1 capsule of Heliocare Ultra. For prolonged exposure (such as a full day in the sun), you can repeat your dose of the supplement 4 hours after your first dose. Heliocare can be purchased at [employer], at some [facility] or at [email or web address].     Recommend daily broad spectrum sunscreen SPF 30+ to sun-exposed areas, reapply every 2 hours as needed. Call for new or changing lesions.  Staying in the shade or wearing long sleeves, sun glasses (UVA+UVB protection) and wide brim hats (4-inch brim around the entire circumference of the hat) are also recommended for sun protection.    Due to recent changes in healthcare laws, you may see results of your pathology and/or laboratory studies on MyChart before the doctors have had a chance to review them. We understand that in some cases there may be results that are confusing or concerning to you. Please understand that not all results are received at the same time and often the doctors may need to interpret multiple results in order to provide you with the best plan of care or course of treatment. Therefore, we ask that you please give [place] 2 business days to thoroughly review all your results before contacting the office for clarification. Should we see a critical lab result, you will be contacted sooner.   If You Need Anything After Your Visit  If you have any questions or concerns for your doctor, please call our main line at [phone] and press option 4  to reach your doctor's medical assistant. If no one answers, please leave a voicemail as directed and we will return your call as soon as possible. Messages left after 4 pm will be answered the following business day.   You may also send [place] a message via MyChart. We typically respond to MyChart messages within 1-2 business days.  For prescription refills, please ask your pharmacy to contact our office. Our fax number is [phone].  If you have an urgent issue when the clinic is closed that cannot wait until the next business day, you can page your doctor at the number below.    Please note that while we do our best to be available for urgent issues outside of office hours, we are not available 24/7.   If you have an urgent issue and are unable to reach [place], you may choose to seek medical care at your doctor's office, retail clinic, urgent care center, or emergency room.  If you have a medical emergency, please immediately call 911 or go to the emergency department.  Pager Numbers  - Dr. Gwen Pounds: [phone]  - Dr. Neale Burly: [phone]  - Dr. Roseanne Reno: [phone]  In the event of inclement weather, please call our main line at [phone] for an update on the status of any delays or closures.  Dermatology Medication Tips: Please keep the boxes that topical medications come in in order to help keep track of the instructions about where and how to use these. Pharmacies typically print the medication instructions only on the boxes and not directly on the  medication tubes.   If your medication is too expensive, please contact our office at [phone] option 4 or send [place] a message through MyChart.   We are unable to tell what your co-pay for medications will be in advance as this is different depending on your insurance coverage. However, we may be able to find a substitute medication at lower cost or fill out paperwork to get insurance to cover a needed medication.   If a prior  authorization is required to get your medication covered by your insurance company, please allow [place] 1-2 business days to complete this process.  Drug prices often vary depending on where the prescription is filled and some pharmacies may offer cheaper prices.  The website www.goodrx.com contains coupons for medications through different pharmacies. The prices here do not account for what the cost may be with help from insurance (it may be cheaper with your insurance), but the website can give you the price if you did not use any insurance.  - You can print the associated coupon and take it with your prescription to the pharmacy.  - You may also stop by our office during regular business hours and pick up a GoodRx coupon card.  - If you need your prescription sent electronically to a different pharmacy, notify our office through [facility] or by phone at [phone] option 4.     Si Usted Necesita Algo Despus de Su Visita  Tambin puede enviarnos un mensaje a travs de Clinical cytogeneticist. Por lo general respondemos a los mensajes de MyChart en el transcurso de 1 a 2 das hbiles.  Para renovar recetas, por favor pida a su farmacia que se ponga en contacto con nuestra oficina. Annie Sable de fax es [place] [phone].  Si tiene un asunto urgente cuando la clnica est cerrada y que no puede esperar hasta el siguiente da hbil, puede llamar/localizar a su doctor(a) al nmero que aparece a continuacin.   Por favor, tenga en cuenta que aunque hacemos todo lo posible para estar disponibles para asuntos urgentes fuera del horario de [place], no estamos disponibles las 24 horas del da, los 7 [address]  [address] de la [place].   Si tiene un problema urgente y no puede comunicarse con nosotros, puede optar por buscar atencin mdica  en el consultorio de su doctor(a), en una clnica privada, en un centro de atencin urgente o en una sala de emergencias.  Si tiene Engineer, drilling, por favor llame  inmediatamente al 911 o vaya a la sala de emergencias.  Nmeros de bper  - Dr. Gwen Pounds: [phone]  - Dra. Moye: [phone]  - Dra. Roseanne Reno: [phone]  En caso de inclemencias del [place], por favor llame a Lacy Duverney principal al [phone] para una actualizacin sobre el [place] de cualquier retraso o cierre.  Consejos para la medicacin en dermatologa: Por favor, guarde las cajas en las que vienen los medicamentos de uso tpico para ayudarle a seguir las instrucciones sobre dnde y cmo usarlos. Las farmacias generalmente imprimen las instrucciones del medicamento slo en las cajas y no directamente en los tubos del [place].   Si su medicamento es muy caro, por favor, pngase en contacto con Rolm Gala llamando al [phone] y presione la opcin 4 o envenos un mensaje a travs de Clinical cytogeneticist.   No podemos decirle cul ser su copago por los medicamentos por adelantado ya que esto es diferente dependiendo de la cobertura de su seguro. Sin embargo, es posible que podamos encontrar un medicamento sustituto a Adult nurse  costo o llenar un formulario para que el seguro cubra el medicamento que se considera necesario.   Si se requiere una autorizacin previa para que su compaa de seguros [place] su medicamento, por favor permtanos de 1 a 2 das hbiles para completar este proceso.  Los precios de los medicamentos varan con frecuencia dependiendo del Environmental consultant de dnde se surte la receta y alguna farmacias pueden ofrecer precios ms baratos.  El sitio web www.goodrx.com tiene cupones para medicamentos de Airline pilot. Los precios aqu no tienen en cuenta lo que podra costar con la ayuda del seguro (puede ser ms barato con su seguro), pero el sitio web puede darle el precio si no utiliz Research scientist (physical sciences).  - Puede imprimir el cupn correspondiente y llevarlo con su receta a la farmacia.  - Tambin puede pasar por nuestra oficina durante el horario de atencin regular y Charity fundraiser  una tarjeta de cupones de GoodRx.  - Si necesita que su receta se enve electrnicamente a una farmacia diferente, informe a nuestra oficina a travs de MyChart de [facility] o por telfono llamando al [phone] y presione la opcin 4.

## 2022-12-12 NOTE — Progress Notes (Signed)
   Follow-Up Visit   Subjective  Jennifer Ware is a 52 y.o. female who presents for the following: Acne Vulgaris. Face. Using Tretinoin 0.1% cream at bedtime, Clindamycin in the morning and hypochlorous spray Melasma recheck. Face. Using Skin Medicinals HQ 12%, Kojic acid, vitamin C cream as directed.  The following portions of the chart were reviewed this encounter and updated as appropriate: medications, allergies, medical history  Review of Systems:  No other skin or systemic complaints except as noted in HPI or Assessment and Plan.  Objective  Well appearing patient in no apparent distress; mood and affect are within normal limits.  Areas Examined: Face, chest and back  Relevant exam findings are noted in the Assessment and Plan.   Assessment & Plan    ACNE VULGARIS Exam: rare open comedone  Chronic condition with duration or expected duration over one year. Currently well-controlled.  Treatment Plan: Continue tretinoin increasing to 0.1% cream nightly as tolerated. Start Azelaic acid twice daily to help with acne plus PIH Continue hypochlorous spray once daily. Stop Clindamycin (use azelaic acid instead)   MELASMA Exam: reticulated hyperpigmented patches at face  Chronic and persistent condition with duration or expected duration over one year. Condition is symptomatic/ bothersome to patient. Not currently at goal.  She has failed topical hydroquinone compound.  Melasma is a chronic; persistent condition of hyperpigmented patches generally on the face, worse in summer due to higher UV exposure.    Heredity; thyroid disease; sun exposure; pregnancy; birth control pills; epilepsy medication and darker skin may predispose to Melasma.   Recommendations include: - Sun avoidance and daily broad spectrum (UVA/UVB) tinted mineral sunscreen SPF 30+, with Zinc or Titanium Dioxide. - Rx topical bleaching creams (i.e. hydroquinone) is a common treatment but should not be used  long term.  Hydroquinones may be mixed with retinoids; vitamin C; steroids; Kojic Acid. - Alastin A-luminate, retinoids, vitamin C, topical tranexamic acid, glycolic acid and kojic acid can be used for brightening while on break from hydroquinone - Rx Azelaic Acid is also a treatment option that is safe for pregnancy (Category B). - OTC Heliocare can be helpful in control and prevention. - Oral Rx with Tranexamic Acid 250 mg - 650 mg po daily can be used for moderate to severe cases especially during summer (contraindications include pregnancy; lactation; hx of PE; hx of DVT; clotting disorder; heart disease; anticoagulant use and upcoming long trips)   - Chemical peels (would need multiple for best result).  - Lasers and  Microdermabrasion may also be helpful adjunct treatments.  Discussed risk of blood clots when taking oral Tranexamic Acid. Patient states there is no personal or family Hx of blood clots.   Treatment Plan: Start tranexamic acid 650 mg daily. Reviewed risk of blood clots. She denies personal or family history of blood clots. Reviewed to watch for swelling or pain of legs or arms, or shortness of breath. Plan treatment for 8-12 weeks.   Return in about 6 weeks (around 01/23/2023) for Acne Follow Up, Melasma Follow Up with Dr. Roseanne Reno.  I, Lawson Radar, CMA, am acting as scribe for Darden Dates, MD.   Documentation: I have reviewed the above documentation for accuracy and completeness, and I agree with the above.  Darden Dates, MD

## 2022-12-13 ENCOUNTER — Encounter: Payer: Self-pay | Admitting: Dermatology

## 2022-12-13 ENCOUNTER — Other Ambulatory Visit: Payer: Self-pay

## 2022-12-13 ENCOUNTER — Other Ambulatory Visit: Payer: Self-pay | Admitting: Dermatology

## 2022-12-13 MED ORDER — TRANEXAMIC ACID 650 MG PO TABS
650.0000 mg | ORAL_TABLET | Freq: Every day | ORAL | 1 refills | Status: DC
  Filled 2022-12-13: qty 30, 30d supply, fill #0

## 2022-12-19 ENCOUNTER — Other Ambulatory Visit: Payer: Self-pay

## 2022-12-19 ENCOUNTER — Other Ambulatory Visit: Payer: Self-pay | Admitting: Internal Medicine

## 2022-12-19 ENCOUNTER — Other Ambulatory Visit: Payer: Self-pay | Admitting: Dermatology

## 2022-12-19 DIAGNOSIS — L700 Acne vulgaris: Secondary | ICD-10-CM

## 2022-12-19 MED FILL — Fluocinolone Acetonide (Otic) Oil 0.01%: OTIC | 30 days supply | Qty: 20 | Fill #0 | Status: AC

## 2022-12-20 ENCOUNTER — Other Ambulatory Visit: Payer: Self-pay

## 2022-12-27 ENCOUNTER — Other Ambulatory Visit: Payer: Self-pay

## 2022-12-29 ENCOUNTER — Encounter: Payer: Self-pay | Admitting: Internal Medicine

## 2022-12-30 ENCOUNTER — Other Ambulatory Visit: Payer: Self-pay

## 2022-12-30 ENCOUNTER — Other Ambulatory Visit: Payer: Self-pay | Admitting: Internal Medicine

## 2022-12-30 MED ORDER — ESTRADIOL 0.1 MG/GM VA CREA
TOPICAL_CREAM | VAGINAL | 5 refills | Status: DC
  Filled 2022-12-30: qty 42.5, 30d supply, fill #0
  Filled 2023-09-04: qty 42.5, 30d supply, fill #1

## 2023-01-10 ENCOUNTER — Telehealth: Payer: Self-pay

## 2023-01-10 ENCOUNTER — Other Ambulatory Visit: Payer: Self-pay

## 2023-01-10 NOTE — Telephone Encounter (Signed)
Per pharmacy Estradiol cream requires PA.  PA started via [facility] Key: [id number]  Per CMM: Information regarding your request Member should be able to get the drug/product without a PA at this time.

## 2023-01-17 ENCOUNTER — Other Ambulatory Visit: Payer: Self-pay

## 2023-01-20 ENCOUNTER — Ambulatory Visit: Payer: 59 | Admitting: Dermatology

## 2023-01-20 ENCOUNTER — Other Ambulatory Visit: Payer: Self-pay

## 2023-01-20 VITALS — BP 119/84 | HR 71

## 2023-01-20 DIAGNOSIS — L700 Acne vulgaris: Secondary | ICD-10-CM

## 2023-01-20 DIAGNOSIS — L811 Chloasma: Secondary | ICD-10-CM | POA: Diagnosis not present

## 2023-01-20 MED ORDER — TRETINOIN 0.1 % EX CREA
1.0000 | TOPICAL_CREAM | Freq: Every day | CUTANEOUS | 4 refills | Status: DC
  Filled 2023-01-20: qty 45, 30d supply, fill #0
  Filled 2023-09-03: qty 60, 40d supply, fill #1
  Filled 2023-09-04 – 2023-09-10 (×2): qty 45, 30d supply, fill #1
  Filled 2023-09-10: qty 20, 10d supply, fill #1

## 2023-01-20 NOTE — Progress Notes (Signed)
   Follow-Up Visit   Subjective  Jennifer Ware is a 52 y.o. female who presents for the following: Acne Vulgaris and melasma   The following portions of the chart were reviewed this encounter and updated as appropriate: medications, allergies, medical history  Review of Systems:  No other skin or systemic complaints except as noted in HPI or Assessment and Plan.  Objective  Well appearing patient in no apparent distress; mood and affect are within normal limits.  Areas Examined: Face, chest and back  Relevant exam findings are noted in the Assessment and Plan.             Assessment & Plan   Acne vulgaris  Related Medications clindamycin (CLEOCIN T) 1 % SWAB Apply 1 application topically in the morning.  tretinoin (RETIN-A) 0.1 % cream Apply 1 Application topically at bedtime.   ACNE VULGARIS Exam: rare open comedone   Chronic condition with duration or expected duration over one year. Currently well-controlled.   Treatment Plan:  For morning Continue azelaic acid daily in morning use moisturizer with sunscreen  For night Continue azelaic acid in pm  Continue tretinoin 0.1% cream nightly as tolerated. Continue hypochlorous spray once daily      MELASMA Exam: reticulated hyperpigmented patches at face See photos above   Chronic and persistent condition with duration or expected duration over one year. Condition is symptomatic/ bothersome to patient. Not currently at goal.   She has failed topical hydroquinone compound.   Melasma is a chronic; persistent condition of hyperpigmented patches generally on the face, worse in summer due to higher UV exposure.    Heredity; thyroid disease; sun exposure; pregnancy; birth control pills; epilepsy medication and darker skin may predispose to Melasma.   Recommendations include: - Sun avoidance and daily broad spectrum (UVA/UVB) tinted mineral sunscreen SPF 30+, with Zinc or Titanium Dioxide. - Rx topical  bleaching creams (i.e. hydroquinone) is a common treatment but should not be used long term.  Hydroquinones may be mixed with retinoids; vitamin C; steroids; Kojic Acid. - Alastin A-luminate, retinoids, vitamin C, topical tranexamic acid, glycolic acid and kojic acid can be used for brightening while on break from hydroquinone - Rx Azelaic Acid is also a treatment option that is safe for pregnancy (Category B). - OTC Heliocare can be helpful in control and prevention. - Oral Rx with Tranexamic Acid 250 mg - 650 mg po daily can be used for moderate to severe cases especially during summer (contraindications include pregnancy; lactation; hx of PE; hx of DVT; clotting disorder; heart disease; anticoagulant use and upcoming long trips)   - Chemical peels (would need multiple for best result).  - Lasers and  Microdermabrasion may also be helpful adjunct treatments.   Treatment Plan: Patient did not want to start tranexamic acid due to side effects  Continue Alastin A-luminate bid Continue azaleic acid bid Continue tretinoin 0.1 cream qhs Continue [employer] sunscreen qam   Return for 3 - 4 month acne / melasma .  I, Asher Muir, CMA, am acting as scribe for Willeen Niece, MD.   Documentation: I have reviewed the above documentation for accuracy and completeness, and I agree with the above.  Willeen Niece, MD

## 2023-01-20 NOTE — Patient Instructions (Addendum)
For acne/melasma  For morning Continue Alastin illuminator in am  Continue azelaic acid daily in morning D/c moisturizer in am/pm use sunscreen instead for summer months  For night Continue azelaic acid in pm  Continue tretinoin 0.1% cream nightly as tolerated. Continue hypochlorous spray once daily   Recommend taking Heliocare sun protection supplement daily in sunny weather for additional sun protection. For maximum protection on the sunniest days, you can take up to 2 capsules of regular Heliocare OR take 1 capsule of Heliocare Ultra. For prolonged exposure (such as a full day in the sun), you can repeat your dose of the supplement 4 hours after your first dose. Heliocare can be purchased at [employer], at some [facility] or at [email or web address].     Topical retinoid medications like tretinoin/Retin-A, adapalene/Differin, tazarotene/Fabior, and Epiduo/Epiduo Forte can cause dryness and irritation when first started. Only apply a pea-sized amount to the entire affected area. Avoid applying it around the eyes, edges of mouth and creases at the nose. If you experience irritation, use a good moisturizer first and/or apply the medicine less often. If you are doing well with the medicine, you can increase how often you use it until you are applying every night. Be careful with sun protection while using this medication as it can make you sensitive to the sun. This medicine should not be used by pregnant women.      Due to recent changes in healthcare laws, you may see results of your pathology and/or laboratory studies on MyChart before the doctors have had a chance to review them. We understand that in some cases there may be results that are confusing or concerning to you. Please understand that not all results are received at the same time and often the doctors may need to interpret multiple results in order to provide you with the best plan of care or course of treatment. Therefore,  we ask that you please give [place] 2 business days to thoroughly review all your results before contacting the office for clarification. Should we see a critical lab result, you will be contacted sooner.   If You Need Anything After Your Visit  If you have any questions or concerns for your doctor, please call our main line at [phone] and press option 4 to reach your doctor's medical assistant. If no one answers, please leave a voicemail as directed and we will return your call as soon as possible. Messages left after 4 pm will be answered the following business day.   You may also send [place] a message via MyChart. We typically respond to MyChart messages within 1-2 business days.  For prescription refills, please ask your pharmacy to contact our office. Our fax number is [phone].  If you have an urgent issue when the clinic is closed that cannot wait until the next business day, you can page your doctor at the number below.    Please note that while we do our best to be available for urgent issues outside of office hours, we are not available 24/7.   If you have an urgent issue and are unable to reach [place], you may choose to seek medical care at your doctor's office, retail clinic, urgent care center, or emergency room.  If you have a medical emergency, please immediately call 911 or go to the emergency department.  Pager Numbers  - Dr. Gwen Pounds: [phone]  - Dr. Neale Burly: [phone]  - Dr. Roseanne Reno: [phone]  In the event of inclement weather, please call  our main line at [phone] for an update on the status of any delays or closures.  Dermatology Medication Tips: Please keep the boxes that topical medications come in in order to help keep track of the instructions about where and how to use these. Pharmacies typically print the medication instructions only on the boxes and not directly on the medication tubes.   If your medication is too expensive, please contact our  office at [phone] option 4 or send [place] a message through MyChart.   We are unable to tell what your co-pay for medications will be in advance as this is different depending on your insurance coverage. However, we may be able to find a substitute medication at lower cost or fill out paperwork to get insurance to cover a needed medication.   If a prior authorization is required to get your medication covered by your insurance company, please allow [place] 1-2 business days to complete this process.  Drug prices often vary depending on where the prescription is filled and some pharmacies may offer cheaper prices.  The website www.goodrx.com contains coupons for medications through different pharmacies. The prices here do not account for what the cost may be with help from insurance (it may be cheaper with your insurance), but the website can give you the price if you did not use any insurance.  - You can print the associated coupon and take it with your prescription to the pharmacy.  - You may also stop by our office during regular business hours and pick up a GoodRx coupon card.  - If you need your prescription sent electronically to a different pharmacy, notify our office through [facility] or by phone at [phone] option 4.     Si Usted Necesita Algo Despus de Su Visita  Tambin puede enviarnos un mensaje a travs de Clinical cytogeneticist. Por lo general respondemos a los mensajes de MyChart en el transcurso de 1 a 2 das hbiles.  Para renovar recetas, por favor pida a su farmacia que se ponga en contacto con nuestra oficina. Annie Sable de fax es [place] [phone].  Si tiene un asunto urgente cuando la clnica est cerrada y que no puede esperar hasta el siguiente da hbil, puede llamar/localizar a su doctor(a) al nmero que aparece a continuacin.   Por favor, tenga en cuenta que aunque hacemos todo lo posible para estar disponibles para asuntos urgentes fuera del horario de [place], no  estamos disponibles las 24 horas del da, los 7 [address]  [address] de la [place].   Si tiene un problema urgente y no puede comunicarse con nosotros, puede optar por buscar atencin mdica  en el consultorio de su doctor(a), en una clnica privada, en un centro de atencin urgente o en una sala de emergencias.  Si tiene Engineer, drilling, por favor llame inmediatamente al 911 o vaya a la sala de emergencias.  Nmeros de bper  - Dr. Gwen Pounds: [phone]  - Dra. Moye: [phone]  - Dra. Roseanne Reno: [phone]  En caso de inclemencias del [place], por favor llame a Lacy Duverney principal al [phone] para una actualizacin sobre el [place] de cualquier retraso o cierre.  Consejos para la medicacin en dermatologa: Por favor, guarde las cajas en las que vienen los medicamentos de uso tpico para ayudarle a seguir las instrucciones sobre dnde y cmo usarlos. Las farmacias generalmente imprimen las instrucciones del medicamento slo en las cajas y no directamente en los tubos del [place].   Si su medicamento es [employer], por favor,  pngase en contacto con nuestra oficina llamando al [phone] y presione la opcin 4 o envenos un mensaje a travs de Clinical cytogeneticist.   No podemos decirle cul ser su copago por los medicamentos por adelantado ya que esto es diferente dependiendo de la cobertura de su seguro. Sin embargo, es posible que podamos encontrar un medicamento sustituto a Audiological scientist un formulario para que el seguro cubra el medicamento que se considera necesario.   Si se requiere una autorizacin previa para que su compaa de seguros [place] su medicamento, por favor permtanos de 1 a 2 das hbiles para completar [address]  Los precios de los medicamentos varan con frecuencia dependiendo del Environmental consultant de dnde se surte la receta y alguna farmacias pueden ofrecer precios ms baratos.  El sitio web www.goodrx.com tiene cupones para medicamentos de Health and safety inspector. Los precios  aqu no tienen en cuenta lo que podra costar con la ayuda del seguro (puede ser ms barato con su seguro), pero el sitio web puede darle el precio si no utiliz Tourist information centre manager.  - Puede imprimir el cupn correspondiente y llevarlo con su receta a la farmacia.  - Tambin puede pasar por nuestra oficina durante el horario de atencin regular y Education officer, museum una tarjeta de cupones de GoodRx.  - Si necesita que su receta se enve electrnicamente a una farmacia diferente, informe a nuestra oficina a travs de MyChart de [place] o por telfono llamando al [phone] y presione la opcin 4.

## 2023-02-05 ENCOUNTER — Other Ambulatory Visit: Payer: Self-pay

## 2023-02-19 DIAGNOSIS — R519 Headache, unspecified: Secondary | ICD-10-CM | POA: Diagnosis not present

## 2023-02-19 DIAGNOSIS — M25562 Pain in left knee: Secondary | ICD-10-CM | POA: Diagnosis not present

## 2023-02-19 DIAGNOSIS — M546 Pain in thoracic spine: Secondary | ICD-10-CM | POA: Diagnosis not present

## 2023-02-19 DIAGNOSIS — M25561 Pain in right knee: Secondary | ICD-10-CM | POA: Diagnosis not present

## 2023-02-19 DIAGNOSIS — M9901 Segmental and somatic dysfunction of cervical region: Secondary | ICD-10-CM | POA: Diagnosis not present

## 2023-02-19 DIAGNOSIS — M5412 Radiculopathy, cervical region: Secondary | ICD-10-CM | POA: Diagnosis not present

## 2023-02-19 DIAGNOSIS — M9902 Segmental and somatic dysfunction of thoracic region: Secondary | ICD-10-CM | POA: Diagnosis not present

## 2023-02-20 DIAGNOSIS — M5412 Radiculopathy, cervical region: Secondary | ICD-10-CM | POA: Diagnosis not present

## 2023-02-20 DIAGNOSIS — M25562 Pain in left knee: Secondary | ICD-10-CM | POA: Diagnosis not present

## 2023-02-20 DIAGNOSIS — M9901 Segmental and somatic dysfunction of cervical region: Secondary | ICD-10-CM | POA: Diagnosis not present

## 2023-02-20 DIAGNOSIS — R519 Headache, unspecified: Secondary | ICD-10-CM | POA: Diagnosis not present

## 2023-02-20 DIAGNOSIS — M546 Pain in thoracic spine: Secondary | ICD-10-CM | POA: Diagnosis not present

## 2023-02-20 DIAGNOSIS — M25561 Pain in right knee: Secondary | ICD-10-CM | POA: Diagnosis not present

## 2023-02-20 DIAGNOSIS — M9902 Segmental and somatic dysfunction of thoracic region: Secondary | ICD-10-CM | POA: Diagnosis not present

## 2023-02-21 DIAGNOSIS — M25562 Pain in left knee: Secondary | ICD-10-CM | POA: Diagnosis not present

## 2023-02-21 DIAGNOSIS — M9901 Segmental and somatic dysfunction of cervical region: Secondary | ICD-10-CM | POA: Diagnosis not present

## 2023-02-21 DIAGNOSIS — M546 Pain in thoracic spine: Secondary | ICD-10-CM | POA: Diagnosis not present

## 2023-02-21 DIAGNOSIS — R519 Headache, unspecified: Secondary | ICD-10-CM | POA: Diagnosis not present

## 2023-02-21 DIAGNOSIS — M5412 Radiculopathy, cervical region: Secondary | ICD-10-CM | POA: Diagnosis not present

## 2023-02-21 DIAGNOSIS — M9902 Segmental and somatic dysfunction of thoracic region: Secondary | ICD-10-CM | POA: Diagnosis not present

## 2023-02-21 DIAGNOSIS — M25561 Pain in right knee: Secondary | ICD-10-CM | POA: Diagnosis not present

## 2023-02-25 ENCOUNTER — Other Ambulatory Visit (HOSPITAL_COMMUNITY): Payer: Self-pay

## 2023-02-25 DIAGNOSIS — M25562 Pain in left knee: Secondary | ICD-10-CM | POA: Diagnosis not present

## 2023-02-25 DIAGNOSIS — M9902 Segmental and somatic dysfunction of thoracic region: Secondary | ICD-10-CM | POA: Diagnosis not present

## 2023-02-25 DIAGNOSIS — M25561 Pain in right knee: Secondary | ICD-10-CM | POA: Diagnosis not present

## 2023-02-25 DIAGNOSIS — M5412 Radiculopathy, cervical region: Secondary | ICD-10-CM | POA: Diagnosis not present

## 2023-02-25 DIAGNOSIS — M9901 Segmental and somatic dysfunction of cervical region: Secondary | ICD-10-CM | POA: Diagnosis not present

## 2023-02-25 DIAGNOSIS — M546 Pain in thoracic spine: Secondary | ICD-10-CM | POA: Diagnosis not present

## 2023-02-26 DIAGNOSIS — M25562 Pain in left knee: Secondary | ICD-10-CM | POA: Diagnosis not present

## 2023-02-26 DIAGNOSIS — M25561 Pain in right knee: Secondary | ICD-10-CM | POA: Diagnosis not present

## 2023-02-26 DIAGNOSIS — M9902 Segmental and somatic dysfunction of thoracic region: Secondary | ICD-10-CM | POA: Diagnosis not present

## 2023-02-26 DIAGNOSIS — M9901 Segmental and somatic dysfunction of cervical region: Secondary | ICD-10-CM | POA: Diagnosis not present

## 2023-02-26 DIAGNOSIS — M546 Pain in thoracic spine: Secondary | ICD-10-CM | POA: Diagnosis not present

## 2023-02-26 DIAGNOSIS — M5412 Radiculopathy, cervical region: Secondary | ICD-10-CM | POA: Diagnosis not present

## 2023-02-27 DIAGNOSIS — M9902 Segmental and somatic dysfunction of thoracic region: Secondary | ICD-10-CM | POA: Diagnosis not present

## 2023-02-27 DIAGNOSIS — M25562 Pain in left knee: Secondary | ICD-10-CM | POA: Diagnosis not present

## 2023-02-27 DIAGNOSIS — M25561 Pain in right knee: Secondary | ICD-10-CM | POA: Diagnosis not present

## 2023-02-27 DIAGNOSIS — M5412 Radiculopathy, cervical region: Secondary | ICD-10-CM | POA: Diagnosis not present

## 2023-02-27 DIAGNOSIS — M9901 Segmental and somatic dysfunction of cervical region: Secondary | ICD-10-CM | POA: Diagnosis not present

## 2023-02-27 DIAGNOSIS — M546 Pain in thoracic spine: Secondary | ICD-10-CM | POA: Diagnosis not present

## 2023-03-04 ENCOUNTER — Other Ambulatory Visit: Payer: Self-pay

## 2023-03-04 DIAGNOSIS — M546 Pain in thoracic spine: Secondary | ICD-10-CM | POA: Diagnosis not present

## 2023-03-04 DIAGNOSIS — M25562 Pain in left knee: Secondary | ICD-10-CM | POA: Diagnosis not present

## 2023-03-04 DIAGNOSIS — M9901 Segmental and somatic dysfunction of cervical region: Secondary | ICD-10-CM | POA: Diagnosis not present

## 2023-03-04 DIAGNOSIS — M5412 Radiculopathy, cervical region: Secondary | ICD-10-CM | POA: Diagnosis not present

## 2023-03-04 DIAGNOSIS — M9902 Segmental and somatic dysfunction of thoracic region: Secondary | ICD-10-CM | POA: Diagnosis not present

## 2023-03-04 DIAGNOSIS — M25561 Pain in right knee: Secondary | ICD-10-CM | POA: Diagnosis not present

## 2023-04-11 DIAGNOSIS — M546 Pain in thoracic spine: Secondary | ICD-10-CM | POA: Diagnosis not present

## 2023-04-11 DIAGNOSIS — M9902 Segmental and somatic dysfunction of thoracic region: Secondary | ICD-10-CM | POA: Diagnosis not present

## 2023-04-11 DIAGNOSIS — M25562 Pain in left knee: Secondary | ICD-10-CM | POA: Diagnosis not present

## 2023-04-11 DIAGNOSIS — M25561 Pain in right knee: Secondary | ICD-10-CM | POA: Diagnosis not present

## 2023-04-11 DIAGNOSIS — M5412 Radiculopathy, cervical region: Secondary | ICD-10-CM | POA: Diagnosis not present

## 2023-04-11 DIAGNOSIS — M9901 Segmental and somatic dysfunction of cervical region: Secondary | ICD-10-CM | POA: Diagnosis not present

## 2023-06-02 ENCOUNTER — Other Ambulatory Visit: Payer: Self-pay

## 2023-06-02 ENCOUNTER — Encounter: Payer: Self-pay | Admitting: Dermatology

## 2023-06-02 ENCOUNTER — Ambulatory Visit: Payer: 59 | Admitting: Dermatology

## 2023-06-02 VITALS — BP 111/85

## 2023-06-02 DIAGNOSIS — L811 Chloasma: Secondary | ICD-10-CM | POA: Diagnosis not present

## 2023-06-02 DIAGNOSIS — L700 Acne vulgaris: Secondary | ICD-10-CM

## 2023-06-02 MED ORDER — HYDROQUINONE 4 % EX CREA
TOPICAL_CREAM | Freq: Two times a day (BID) | CUTANEOUS | 2 refills | Status: DC
  Filled 2023-06-02: qty 28.35, 30d supply, fill #0

## 2023-06-02 MED ORDER — HYDROQUINONE 4 % EX CREA: TOPICAL_CREAM | Freq: Two times a day (BID) | CUTANEOUS | 2 refills | Status: DC

## 2023-06-02 NOTE — Progress Notes (Signed)
Follow-Up Visit   Subjective  Jennifer Ware is a 52 y.o. female who presents for the following: Acne face 98m f/u, Azelaic acid bid, Tretinoin 0.1% cr qhs, hypochlorous spray prn, acne improved, Melasma face 54m f/u, not using Alastin A luminate did not see a big difference, Azalaic acid bid, Tretinoin 0.1% cr qhs, sunscreen qam, doesn't feel much improvement with melasma.  She has tried SM 12% HQ cream in past, but that was too strong and lightened her skin too much. The patient has spots, moles and lesions to be evaluated, some may be new or changing and the patient may have concern these could be cancer.   The following portions of the chart were reviewed this encounter and updated as appropriate: medications, allergies, medical history  Review of Systems:  No other skin or systemic complaints except as noted in HPI or Assessment and Plan.  Objective  Well appearing patient in no apparent distress; mood and affect are within normal limits.   A focused examination was performed of the following areas: face  Relevant exam findings are noted in the Assessment and Plan.         Assessment & Plan   ACNE VULGARIS face Exam: face clear today  Chronic condition with duration or expected duration over one year. Currently well-controlled.   Treatment Plan: Cont Azelaic acid bid to face Cont Tretinoin 0.1% cream qhs as tolerated. Cont SPF qam  Topical retinoid medications like tretinoin/Retin-A, adapalene/Differin, tazarotene/Fabior, and Epiduo/Epiduo Forte can cause dryness and irritation when first started. Only apply a pea-sized amount to the entire affected area. Avoid applying it around the eyes, edges of mouth and creases at the nose. If you experience irritation, use a good moisturizer first and/or apply the medicine less often. If you are doing well with the medicine, you can increase how often you use it until you are applying every night. Be careful with sun protection  while using this medication as it can make you sensitive to the sun. This medicine should not be used by pregnant women.    MELASMA face Exam: reticulated hyperpigmented patches at upper lip and bil zygoma, no accentuation with Wood's lamp, pt has diffuse darker pigmented skin at forehead  Chronic and persistent condition with duration or expected duration over one year. Condition is symptomatic/ bothersome to patient. Not currently at goal.   Melasma is a chronic; persistent condition of hyperpigmented patches generally on the face, worse in summer due to higher UV exposure.    Heredity; thyroid disease; sun exposure; pregnancy; birth control pills; epilepsy medication and darker skin may predispose to Melasma.   Recommendations include: - Sun avoidance and daily broad spectrum (UVA/UVB) tinted mineral sunscreen SPF 30+, with Zinc or Titanium Dioxide. - Rx topical bleaching creams (i.e. hydroquinone) is a common treatment but should not be used long term.  Hydroquinones may be mixed with retinoids; vitamin C; steroids; Kojic Acid. - Alastin A-luminate, retinoids, vitamin C, topical tranexamic acid, glycolic acid and kojic acid can be used for brightening while on break from hydroquinone - Rx Azelaic Acid is also a treatment option that is safe for pregnancy (Category B). - OTC Heliocare can be helpful in control and prevention. - Oral Rx with Tranexamic Acid 250 mg - 650 mg po daily can be used for moderate to severe cases especially during summer (contraindications include pregnancy; lactation; hx of PE; hx of DVT; clotting disorder; heart disease; anticoagulant use and upcoming long trips)   - Chemical peels (would  need multiple for best result).  - Lasers and  Microdermabrasion may also be helpful adjunct treatments.  Treatment Plan: Cont Azalaic acid bid to face Cont Tretinoin 0.1% cr qhs to face Cont tinted SPF qam (colorscience) Start Hydroquinone 4% bid spot treat to dark spots  face D/C Alastin Luminate  Hydroquinone 4% cream- apply pea sized amount twice daily to dark spots for up to 3 months. This cannot be used more than 3 months due to risk of exogenous ochronosis (permanent dark spots).    Discussed that her melasma may be more resistant since it is dermal (deeper) (had less accentuation on Wood's lamp exam.)   Return for 3-85m Acne Melasma.  I, Ardis Rowan, RMA, am acting as scribe for Willeen Niece, MD .   Documentation: I have reviewed the above documentation for accuracy and completeness, and I agree with the above.  Willeen Niece, MD

## 2023-06-02 NOTE — Patient Instructions (Addendum)
In the morning apply Azalaic Acid first to face Then apply the Hydroquinone to spot treat the dark spots Then apply sunscreen  At night  Apply Azalaic Acid to face Then apply Tretinoin to face Then apply Hydroquinone to spot treat dark spots  Reviewed risk of permanent dark spots (ochronosis) if hydroquinone is used longer than 3 months.   Hydroquinone 4% cream- apply pea sized amount twice daily to dark spots for up to 3 months. This cannot be used more than 3 months due to risk of exogenous ochronosis (permanent dark spots).   Due to recent changes in healthcare laws, you may see results of your pathology and/or laboratory studies on MyChart before the doctors have had a chance to review them. We understand that in some cases there may be results that are confusing or concerning to you. Please understand that not all results are received at the same time and often the doctors may need to interpret multiple results in order to provide you with the best plan of care or course of treatment. Therefore, we ask that you please give [place] 2 business days to thoroughly review all your results before contacting the office for clarification. Should we see a critical lab result, you will be contacted sooner.   If You Need Anything After Your Visit  If you have any questions or concerns for your doctor, please call our main line at [phone] and press option 4 to reach your doctor's medical assistant. If no one answers, please leave a voicemail as directed and we will return your call as soon as possible. Messages left after 4 pm will be answered the following business day.   You may also send [place] a message via MyChart. We typically respond to MyChart messages within 1-2 business days.  For prescription refills, please ask your pharmacy to contact our office. Our fax number is [phone].  If you have an urgent issue when the clinic is closed that cannot wait until the next business day, you can page  your doctor at the number below.    Please note that while we do our best to be available for urgent issues outside of office hours, we are not available 24/7.   If you have an urgent issue and are unable to reach [place], you may choose to seek medical care at your doctor's office, retail clinic, urgent care center, or emergency room.  If you have a medical emergency, please immediately call 911 or go to the emergency department.  Pager Numbers  - Dr. Gwen Pounds: [phone]  - Dr. Roseanne Reno: [phone]  - Dr. Katrinka Blazing: [phone]   In the event of inclement weather, please call our main line at [phone] for an update on the status of any delays or closures.  Dermatology Medication Tips: Please keep the boxes that topical medications come in in order to help keep track of the instructions about where and how to use these. Pharmacies typically print the medication instructions only on the boxes and not directly on the medication tubes.   If your medication is too expensive, please contact our office at [phone] option 4 or send [place] a message through MyChart.   We are unable to tell what your co-pay for medications will be in advance as this is different depending on your insurance coverage. However, we may be able to find a substitute medication at lower cost or fill out paperwork to get insurance to cover a needed medication.   If a prior authorization is required  to get your medication covered by your insurance company, please allow [place] 1-2 business days to complete this process.  Drug prices often vary depending on where the prescription is filled and some pharmacies may offer cheaper prices.  The website www.goodrx.com contains coupons for medications through different pharmacies. The prices here do not account for what the cost may be with help from insurance (it may be cheaper with your insurance), but the website can give you the price if you did not use any insurance.  - You can  print the associated coupon and take it with your prescription to the pharmacy.  - You may also stop by our office during regular business hours and pick up a GoodRx coupon card.  - If you need your prescription sent electronically to a different pharmacy, notify our office through [facility] or by phone at [phone] option 4.     Si Usted Necesita Algo Despus de Su Visita  Tambin puede enviarnos un mensaje a travs de Clinical cytogeneticist. Por lo general respondemos a los mensajes de MyChart en el transcurso de 1 a 2 das hbiles.  Para renovar recetas, por favor pida a su farmacia que se ponga en contacto con nuestra oficina. Annie Sable de fax es [place] [phone].  Si tiene un asunto urgente cuando la clnica est cerrada y que no puede esperar hasta el siguiente da hbil, puede llamar/localizar a su doctor(a) al nmero que aparece a continuacin.   Por favor, tenga en cuenta que aunque hacemos todo lo posible para estar disponibles para asuntos urgentes fuera del horario de [place], no estamos disponibles las 24 horas del da, los 7 [address]  [address] de la [place].   Si tiene un problema urgente y no puede comunicarse con nosotros, puede optar por buscar atencin mdica  en el consultorio de su doctor(a), en una clnica privada, en un centro de atencin urgente o en una sala de emergencias.  Si tiene Engineer, drilling, por favor llame inmediatamente al 911 o vaya a la sala de emergencias.  Nmeros de bper  - Dr. Gwen Pounds: [phone]  - Dra. Roseanne Reno: [phone]  - Dr. Katrinka Blazing: [phone]   En caso de inclemencias del tiempo, por favor llame a Lacy Duverney principal al [phone] para una actualizacin sobre el [place] de cualquier retraso o cierre.  Consejos para la medicacin en dermatologa: Por favor, guarde las cajas en las que vienen los medicamentos de uso tpico para ayudarle a seguir las instrucciones sobre dnde y cmo usarlos. Las farmacias generalmente imprimen las  instrucciones del medicamento slo en las cajas y no directamente en los tubos del [place].   Si su medicamento es muy caro, por favor, pngase en contacto con Rolm Gala llamando al [phone] y presione la opcin 4 o envenos un mensaje a travs de Clinical cytogeneticist.   No podemos decirle cul ser su copago por los medicamentos por adelantado ya que esto es diferente dependiendo de la cobertura de su seguro. Sin embargo, es posible que podamos encontrar un medicamento sustituto a Audiological scientist un formulario para que el seguro cubra el medicamento que se considera necesario.   Si se requiere una autorizacin previa para que su compaa de seguros [place] su medicamento, por favor permtanos de 1 a 2 das hbiles para completar [address]  Los precios de los medicamentos varan con frecuencia dependiendo del Environmental consultant de dnde se surte la receta y alguna farmacias pueden ofrecer precios ms baratos.  El sitio web www.goodrx.com tiene cupones para medicamentos de  diferentes farmacias. Los precios aqu no tienen en cuenta lo que podra costar con la ayuda del seguro (puede ser ms barato con su seguro), pero el sitio web puede darle el precio si no utiliz Tourist information centre manager.  - Puede imprimir el cupn correspondiente y llevarlo con su receta a la farmacia.  - Tambin puede pasar por nuestra oficina durante el horario de atencin regular y Education officer, museum una tarjeta de cupones de GoodRx.  - Si necesita que su receta se enve electrnicamente a una farmacia diferente, informe a nuestra oficina a travs de MyChart de [place] o por telfono llamando al [phone] y presione la opcin 4.

## 2023-06-13 ENCOUNTER — Other Ambulatory Visit: Payer: Self-pay

## 2023-06-13 DIAGNOSIS — J301 Allergic rhinitis due to pollen: Secondary | ICD-10-CM | POA: Diagnosis not present

## 2023-06-13 DIAGNOSIS — L299 Pruritus, unspecified: Secondary | ICD-10-CM | POA: Diagnosis not present

## 2023-06-13 MED ORDER — CLOTRIMAZOLE-BETAMETHASONE 1-0.05 % EX CREA
TOPICAL_CREAM | CUTANEOUS | 3 refills | Status: AC
  Filled 2023-06-13: qty 15, 30d supply, fill #0
  Filled 2023-10-02: qty 15, 30d supply, fill #1

## 2023-06-27 ENCOUNTER — Other Ambulatory Visit: Payer: Self-pay | Admitting: Internal Medicine

## 2023-06-27 DIAGNOSIS — Z1231 Encounter for screening mammogram for malignant neoplasm of breast: Secondary | ICD-10-CM

## 2023-06-30 ENCOUNTER — Encounter: Payer: Self-pay | Admitting: Advanced Practice Midwife

## 2023-07-01 ENCOUNTER — Other Ambulatory Visit (HOSPITAL_COMMUNITY)
Admission: RE | Admit: 2023-07-01 | Discharge: 2023-07-01 | Disposition: A | Discharge status: Home / Self Care | Payer: 59 | Source: Ambulatory Visit | Attending: Advanced Practice Midwife | Admitting: Advanced Practice Midwife

## 2023-07-01 ENCOUNTER — Encounter: Payer: Self-pay | Admitting: Advanced Practice Midwife

## 2023-07-01 ENCOUNTER — Other Ambulatory Visit: Payer: Self-pay

## 2023-07-01 ENCOUNTER — Ambulatory Visit: Payer: 59 | Admitting: Advanced Practice Midwife

## 2023-07-01 VITALS — BP 120/78 | HR 69 | Ht 63.0 in | Wt 144.3 lb

## 2023-07-01 DIAGNOSIS — Z113 Encounter for screening for infections with a predominantly sexual mode of transmission: Secondary | ICD-10-CM | POA: Diagnosis not present

## 2023-07-01 DIAGNOSIS — Z01411 Encounter for gynecological examination (general) (routine) with abnormal findings: Secondary | ICD-10-CM | POA: Diagnosis not present

## 2023-07-01 DIAGNOSIS — N898 Other specified noninflammatory disorders of vagina: Secondary | ICD-10-CM | POA: Diagnosis not present

## 2023-07-01 DIAGNOSIS — Z1151 Encounter for screening for human papillomavirus (HPV): Secondary | ICD-10-CM

## 2023-07-01 DIAGNOSIS — Z124 Encounter for screening for malignant neoplasm of cervix: Secondary | ICD-10-CM | POA: Diagnosis not present

## 2023-07-01 DIAGNOSIS — J301 Allergic rhinitis due to pollen: Secondary | ICD-10-CM | POA: Diagnosis not present

## 2023-07-01 DIAGNOSIS — Z1239 Encounter for other screening for malignant neoplasm of breast: Secondary | ICD-10-CM

## 2023-07-01 NOTE — Progress Notes (Signed)
GYNECOLOGY ANNUAL PREVENTATIVE CARE ENCOUNTER NOTE  History:     Jennifer Ware is a 52 y.o. [id number] female here for a routine annual gynecologic exam.  Current complaints: vaginal dryness. Recently prescribed vaginal estrogen by PCP.   Denies abnormal vaginal bleeding, discharge, pelvic pain, problems with intercourse or other gynecologic concerns.    Gynecologic History Patient's last menstrual period was 09/04/2018. Contraception: post menopausal status Last Pap: 2022. Results were: normal with negative HPV Last mammogram: 2023. Results were: normal  Obstetric History OB History  Gravida Para Term Preterm AB Living  3 3 3     3   SAB IAB Ectopic Multiple Live Births               # Outcome Date GA Lbr Len/2nd Weight Sex Type Anes PTL Lv  3 Term 2005    M CS-LTranv     2 Term 2002    M CS-LTranv     1 Term 1999    F CS-LTranv       Past Medical History:  Diagnosis Date   Abdominal pain 10/29/2011   Amenorrhea 10/22/2011   Anxiety    Chronic kidney disease    chronic UTI    Chronic UTI 12/24/2011   Dyspareunia 04/19/2014   GERD (gastroesophageal reflux disease)    HTN (hypertension) 04/25/2014   Hx gestational diabetes    Irregular periods/menstrual cycles 03/04/2014   Knee crepitus 07/27/2014   Low back pain 06/07/2011   Osteoarthritis of both knees 08/07/2016   Otitis externa 06/07/2011   Palpitations    Sciatica of left side 09/01/2017   SI (sacroiliac) joint dysfunction 09/01/2017   Thyroid disease     Past Surgical History:  Procedure Laterality Date   BREAST BIOPSY Right 08/24/2014   negative, u/s core   CESAREAN SECTION     x3   HERNIA REPAIR     umbilical   UMBILICAL HERNIA REPAIR      Current Outpatient Medications on File Prior to Visit  Medication Sig Dispense Refill   Ascorbic Acid (VITAMIN C) 1000 MG tablet Take 1,000 mg by mouth daily.     Azelaic Acid 15 % gel Apply twice daily to face 50 g 3   clotrimazole-betamethasone (LOTRISONE)  cream Apply a small amount topically to each ear twice weekly as needed for itching 15 g 3   estradiol (ESTRACE) 0.1 MG/GM vaginal cream Estrogen Cream Instruction  Discard applicator  Apply pea sized amount to tip of finger to urethra before bed. Wash hands well after application. Use Monday, Wednesday and Friday 42.5 g 5   famotidine (PEPCID) 40 MG tablet Take 1 tablet (40 mg total) by mouth at bedtime. 30 tablet 1   Fluocinolone Acetonide 0.01 % OIL PLACE 2 DROPS TO AFFECTED EAR, NO MORE THAN 1 WEEK AT A TIME 20 mL 1   hydroquinone 4 % cream Apply 1 application topically 2 (two) times daily to dark spots on face. 28.35 g 2   Multiple Vitamins-Minerals (ANTIOXIDANT PO) Take by mouth daily.     tranexamic acid (LYSTEDA) 650 MG TABS tablet Take 1 tablet (650 mg total) by mouth daily. Watch for signs of blood clots like swelling or pain of the leg or arm or shortness of breath. 30 tablet 1   tretinoin (RETIN-A) 0.1 % cream Apply 1 Application topically at bedtime. 45 g 4   AMBULATORY NON FORMULARY MEDICATION Thigh-high, medium compression, graduated compression stockings. Apply to lower extremities. [employer]ream[employer], Zippered Compression Stockings, medium  circ, long length 2 each 0   Calcium 600-200 MG-UNIT per tablet Take 2 tablets by mouth daily. (Patient not taking: Reported on 07/01/2023)     clindamycin (CLEOCIN T) 1 % SWAB Apply 1 application topically in the morning. (Patient not taking: Reported on 06/02/2023) 60 each 5   cyanocobalamin 1000 MCG tablet Take 100 mcg by mouth daily. (Patient not taking: Reported on 07/01/2023)     diazepam (VALIUM) 10 MG tablet Take 1 tablet (10 mg total) by mouth as needed (Prior to intercourse). Do not use this medication orally.  Please 1 tablet in the vagina 1 hour prior to intercourse.  Please do not drive or use machinery after medication administration. (Patient not taking: Reported on 07/01/2023) 10 tablet 0   diclofenac Sodium (PENNSAID) 2 %  SOLN Apply 2 Pump (40 mg total) topically daily as needed. (Patient not taking: Reported on 07/01/2023) 112 g 5   No current facility-administered medications on file prior to visit.    Allergies  Allergen Reactions   Flagyl [Metronidazole]    Lactose Intolerance (Gi)    Meloxicam    Silver Sulfadiazine    Sulfa Antibiotics    Yeast-Derived Drug Products     Social History:  reports that she has never smoked. She has never been exposed to tobacco smoke. She has never used smokeless tobacco. She reports that she does not currently use alcohol. She reports that she does not use drugs.  Family History  Problem Relation Age of Onset   Asthma Father    Colon cancer Neg Hx    Breast cancer Neg Hx    Colon polyps Neg Hx    Esophageal cancer Neg Hx    Rectal cancer Neg Hx    Stomach cancer Neg Hx     The following portions of the patient's history were reviewed and updated as appropriate: allergies, current medications, past family history, past medical history, past social history, past surgical history and problem list.  Review of Systems Review of Systems  Constitutional:  Negative for chills and fever.  Gastrointestinal:  Negative for abdominal pain.  Genitourinary:  Negative for dyspareunia, dysuria, genital sores, vaginal bleeding and vaginal discharge.       Positive for vaginal dryness.     Physical Exam:  BP 120/78   Pulse 69   Ht 5\' 3"  (1.6 m)   Wt 144 lb 4.8 oz (65.5 kg)   LMP 09/04/2018   BMI 25.56 kg/m  CONSTITUTIONAL: Well-developed, well-nourished female in no acute distress.  HENT:  Normocephalic, atraumatic. Oropharynx is clear and moist EYES: Conjunctivae normal. No scleral icterus.  SKIN: Skin is warm and dry. No rash noted. Not diaphoretic. No erythema. No pallor. MUSCULOSKELETAL: Normal range of motion. No tenderness.  No cyanosis or edema.   NEUROLOGIC: Alert and oriented to person, place, and time. Normal muscle tone coordination.  PSYCHIATRIC: Normal  mood and affect. Normal behavior. Normal judgment and thought content. CARDIOVASCULAR: Normal heart rate noted. RESPIRATORY: Effort and rate normal. BREASTS: BREASTS: No masses, dimpling of skin, lymphadenopathy, retraction of or drainage from nipples. ABDOMEN: Soft, no distention, tenderness, rebound or guarding.  PELVIC: Normal appearing external genitalia; normal appearing postmenopausal vaginal mucosa and cervix. Tissue atrophic, pale.  No abnormal discharge noted.  Pap smear obtained.   Assessment and Plan:    1. Cervical cancer screening  - Cytology - PAP( [place])  2. Breast screening - Mammogram scheduled  3. Routine screening for STI (sexually transmitted infection)   4.  Vaginal dryness - Has Rx Vaginal estrogen.   Will follow up results of pap smear and manage accordingly. Mammogram scheduled Routine preventative health maintenance measures emphasized. Please refer to After Visit Summary for other counseling recommendations.      Dorathy Kinsman, CNM [facility] [employer], [facility]

## 2023-07-01 NOTE — Patient Instructions (Signed)
Ask about routine screening for HIV, Hepatitis B, Hepatitis C, Syphylis

## 2023-07-03 ENCOUNTER — Other Ambulatory Visit: Payer: Self-pay

## 2023-07-03 LAB — CYTOLOGY - PAP
Adequacy: ABSENT
Chlamydia: NEGATIVE
Comment: NEGATIVE
Comment: NEGATIVE
Comment: NEGATIVE
Comment: NORMAL
Diagnosis: NEGATIVE
High risk HPV: NEGATIVE
Neisseria Gonorrhea: NEGATIVE
Trichomonas: NEGATIVE

## 2023-07-03 MED ORDER — EPINEPHRINE 0.3 MG/0.3ML IJ SOAJ
INTRAMUSCULAR | 0 refills | Status: AC
  Filled 2023-07-03: qty 2, 30d supply, fill #0

## 2023-07-08 ENCOUNTER — Encounter: Payer: Self-pay | Admitting: Advanced Practice Midwife

## 2023-07-08 ENCOUNTER — Other Ambulatory Visit: Payer: Self-pay

## 2023-07-08 DIAGNOSIS — J301 Allergic rhinitis due to pollen: Secondary | ICD-10-CM | POA: Diagnosis not present

## 2023-07-13 ENCOUNTER — Other Ambulatory Visit: Payer: Self-pay | Admitting: Physician Assistant

## 2023-07-13 ENCOUNTER — Other Ambulatory Visit: Payer: Self-pay

## 2023-07-13 DIAGNOSIS — R140 Abdominal distension (gaseous): Secondary | ICD-10-CM

## 2023-07-13 DIAGNOSIS — R142 Eructation: Secondary | ICD-10-CM

## 2023-07-14 ENCOUNTER — Other Ambulatory Visit: Payer: Self-pay

## 2023-07-14 DIAGNOSIS — J301 Allergic rhinitis due to pollen: Secondary | ICD-10-CM | POA: Diagnosis not present

## 2023-07-14 MED FILL — Famotidine Tab 40 MG: ORAL | 30 days supply | Qty: 30 | Fill #0 | Status: CN

## 2023-07-17 DIAGNOSIS — J301 Allergic rhinitis due to pollen: Secondary | ICD-10-CM | POA: Diagnosis not present

## 2023-07-24 DIAGNOSIS — J301 Allergic rhinitis due to pollen: Secondary | ICD-10-CM | POA: Diagnosis not present

## 2023-07-28 ENCOUNTER — Other Ambulatory Visit: Payer: Self-pay

## 2023-07-28 DIAGNOSIS — J301 Allergic rhinitis due to pollen: Secondary | ICD-10-CM | POA: Diagnosis not present

## 2023-07-30 DIAGNOSIS — J301 Allergic rhinitis due to pollen: Secondary | ICD-10-CM | POA: Diagnosis not present

## 2023-07-31 DIAGNOSIS — J301 Allergic rhinitis due to pollen: Secondary | ICD-10-CM | POA: Diagnosis not present

## 2023-08-04 ENCOUNTER — Ambulatory Visit
Admission: RE | Admit: 2023-08-04 | Discharge: 2023-08-04 | Disposition: A | Discharge status: Home / Self Care | Payer: 59 | Source: Ambulatory Visit | Attending: Internal Medicine | Admitting: Internal Medicine

## 2023-08-04 DIAGNOSIS — J301 Allergic rhinitis due to pollen: Secondary | ICD-10-CM | POA: Diagnosis not present

## 2023-08-04 DIAGNOSIS — Z1231 Encounter for screening mammogram for malignant neoplasm of breast: Secondary | ICD-10-CM | POA: Diagnosis not present

## 2023-08-07 DIAGNOSIS — J301 Allergic rhinitis due to pollen: Secondary | ICD-10-CM | POA: Diagnosis not present

## 2023-08-11 DIAGNOSIS — J301 Allergic rhinitis due to pollen: Secondary | ICD-10-CM | POA: Diagnosis not present

## 2023-08-14 DIAGNOSIS — J301 Allergic rhinitis due to pollen: Secondary | ICD-10-CM | POA: Diagnosis not present

## 2023-08-18 DIAGNOSIS — J301 Allergic rhinitis due to pollen: Secondary | ICD-10-CM | POA: Diagnosis not present

## 2023-08-21 DIAGNOSIS — J301 Allergic rhinitis due to pollen: Secondary | ICD-10-CM | POA: Diagnosis not present

## 2023-08-28 DIAGNOSIS — J301 Allergic rhinitis due to pollen: Secondary | ICD-10-CM | POA: Diagnosis not present

## 2023-09-01 DIAGNOSIS — J301 Allergic rhinitis due to pollen: Secondary | ICD-10-CM | POA: Diagnosis not present

## 2023-09-03 ENCOUNTER — Other Ambulatory Visit: Payer: Self-pay

## 2023-09-04 ENCOUNTER — Other Ambulatory Visit: Payer: Self-pay

## 2023-09-04 DIAGNOSIS — J301 Allergic rhinitis due to pollen: Secondary | ICD-10-CM | POA: Diagnosis not present

## 2023-09-08 ENCOUNTER — Encounter: Payer: 59 | Admitting: Internal Medicine

## 2023-09-08 DIAGNOSIS — J301 Allergic rhinitis due to pollen: Secondary | ICD-10-CM | POA: Diagnosis not present

## 2023-09-09 ENCOUNTER — Encounter: Payer: Self-pay | Admitting: Internal Medicine

## 2023-09-09 ENCOUNTER — Ambulatory Visit (INDEPENDENT_AMBULATORY_CARE_PROVIDER_SITE_OTHER): Payer: 59 | Admitting: Internal Medicine

## 2023-09-09 VITALS — BP 112/80 | HR 78 | Temp 98.1°F | Ht 62.0 in | Wt 145.0 lb

## 2023-09-09 DIAGNOSIS — K219 Gastro-esophageal reflux disease without esophagitis: Secondary | ICD-10-CM

## 2023-09-09 DIAGNOSIS — Z0000 Encounter for general adult medical examination without abnormal findings: Secondary | ICD-10-CM | POA: Diagnosis not present

## 2023-09-09 DIAGNOSIS — R634 Abnormal weight loss: Secondary | ICD-10-CM | POA: Insufficient documentation

## 2023-09-09 DIAGNOSIS — Z1159 Encounter for screening for other viral diseases: Secondary | ICD-10-CM | POA: Diagnosis not present

## 2023-09-09 LAB — HEPATIC FUNCTION PANEL
ALT: 17 U/L (ref 0–35)
AST: 17 U/L (ref 0–37)
Albumin: 4.7 g/dL (ref 3.5–5.2)
Alkaline Phosphatase: 85 U/L (ref 39–117)
Bilirubin, Direct: 0.1 mg/dL (ref 0.0–0.3)
Total Bilirubin: 0.6 mg/dL (ref 0.2–1.2)
Total Protein: 7.4 g/dL (ref 6.0–8.3)

## 2023-09-09 LAB — LIPID PANEL
Cholesterol: 211 mg/dL — ABNORMAL HIGH (ref 0–200)
HDL: 73.4 mg/dL (ref >39.00)
LDL Cholesterol: 132 mg/dL — ABNORMAL HIGH (ref 0–99)
NonHDL: 137.4
Total CHOL/HDL Ratio: 3
Triglycerides: 29 mg/dL (ref 0.0–149.0)
VLDL: 5.8 mg/dL (ref 0.0–40.0)

## 2023-09-09 LAB — RENAL FUNCTION PANEL
Albumin: 4.7 g/dL (ref 3.5–5.2)
BUN: 17 mg/dL (ref 6–23)
CO2: 30 meq/L (ref 19–32)
Calcium: 10.1 mg/dL (ref 8.4–10.5)
Chloride: 102 meq/L (ref 96–112)
Creatinine, Ser: 0.87 mg/dL (ref 0.40–1.20)
GFR: 76.06 mL/min (ref >60.00)
Glucose, Bld: 85 mg/dL (ref 70–99)
Phosphorus: 3.7 mg/dL (ref 2.3–4.6)
Potassium: 3.8 meq/L (ref 3.5–5.1)
Sodium: 139 meq/L (ref 135–145)

## 2023-09-09 LAB — CBC
HCT: 42.8 % (ref 36.0–46.0)
Hemoglobin: 14 g/dL (ref 12.0–15.0)
MCHC: 32.7 g/dL (ref 30.0–36.0)
MCV: 88.8 fL (ref 78.0–100.0)
Platelets: 140 10*3/uL — ABNORMAL LOW (ref 150.0–400.0)
RBC: 4.82 Mil/uL (ref 3.87–5.11)
RDW: 13.9 % (ref 11.5–15.5)
WBC: 4.6 10*3/uL (ref 4.0–10.5)

## 2023-09-09 LAB — HEMOGLOBIN A1C: Hgb A1c MFr Bld: 5.5 % (ref 4.6–6.5)

## 2023-09-09 LAB — SEDIMENTATION RATE: Sed Rate: 24 mm/h (ref 0–30)

## 2023-09-09 LAB — TSH: TSH: 1 u[IU]/mL (ref 0.35–5.50)

## 2023-09-09 NOTE — Assessment & Plan Note (Signed)
Increased symptoms and likely causing the night cough Take famotidine every night--and consider to PPI

## 2023-09-09 NOTE — Assessment & Plan Note (Signed)
Healthy Colon due 2032 Recent mammogram--gets them yearly Pap at gyn Flu vaccine--prefers no COVID

## 2023-09-09 NOTE — Assessment & Plan Note (Signed)
May just be from diet change Will check labs

## 2023-09-09 NOTE — Progress Notes (Signed)
Subjective:    Patient ID: Jennifer Ware, female    DOB: [birth year], 53 y.o.   MRN: [id number]  HPI Here for physical  Has lost some weight---without trying--down 9# this year Appetite is good Eating more protein--not as much carbs Some exercise---tolerance is good  Has been getting allergy immunotherapy--with Dr Willeen Cass Has been helping---reduced ear itching, etc  Current Outpatient Medications on File Prior to Visit  Medication Sig Dispense Refill   AMBULATORY NON FORMULARY MEDICATION Thigh-high, medium compression, graduated compression stockings. Apply to lower extremities. [email or web address], Zippered Compression Stockings, medium circ, long length 2 each 0   Ascorbic Acid (VITAMIN C) 1000 MG tablet Take 1,000 mg by mouth daily.     Azelaic Acid 15 % gel Apply twice daily to face 50 g 3   Calcium 600-200 MG-UNIT per tablet Take 2 tablets by mouth daily.     clotrimazole-betamethasone (LOTRISONE) cream Apply a small amount topically to each ear twice weekly as needed for itching 15 g 3   cyanocobalamin 1000 MCG tablet Take 100 mcg by mouth daily.     EPINEPHrine (EPIPEN 2-PAK) 0.3 mg/0.3 mL IJ SOAJ injection Inject into thigh once if needed for severe allergic reaction. 2 each 0   estradiol (ESTRACE) 0.1 MG/GM vaginal cream Estrogen Cream Instruction  Discard applicator  Apply pea sized amount to tip of finger to urethra before bed. Wash hands well after application. Use Monday, Wednesday and Friday 42.5 g 5   famotidine (PEPCID) 40 MG tablet Take 1 tablet (40 mg total) by mouth at bedtime. 30 tablet 1   Fluocinolone Acetonide 0.01 % OIL PLACE 2 DROPS TO AFFECTED EAR, NO MORE THAN 1 WEEK AT A TIME 20 mL 1   hydroquinone 4 % cream Apply 1 application topically 2 (two) times daily to dark spots on face. 28.35 g 2   tretinoin (RETIN-A) 0.1 % cream Apply 1 Application topically at bedtime. 45 g 4   No current facility-administered medications on file prior to visit.     Allergies  Allergen Reactions   Flagyl [Metronidazole]    Lactose Intolerance (Gi)    Meloxicam    Silver Sulfadiazine    Sulfa Antibiotics    Yeast-Derived Drug Products     Past Medical History:  Diagnosis Date   Abdominal pain 10/29/2011   Amenorrhea 10/22/2011   Anxiety    Chronic kidney disease    chronic UTI    Chronic UTI 12/24/2011   Dyspareunia 04/19/2014   GERD (gastroesophageal reflux disease)    HTN (hypertension) 04/25/2014   Hx gestational diabetes    Irregular periods/menstrual cycles 03/04/2014   Knee crepitus 07/27/2014   Low back pain 06/07/2011   Osteoarthritis of both knees 08/07/2016   Otitis externa 06/07/2011   Palpitations    Sciatica of left side 09/01/2017   SI (sacroiliac) joint dysfunction 09/01/2017   Thyroid disease     Past Surgical History:  Procedure Laterality Date   BREAST BIOPSY Right 08/24/2014   negative, u/s core   CESAREAN SECTION     x3   HERNIA REPAIR     umbilical   UMBILICAL HERNIA REPAIR      Family History  Problem Relation Age of Onset   Asthma Father    Colon cancer Neg Hx    Breast cancer Neg Hx    Colon polyps Neg Hx    Esophageal cancer Neg Hx    Rectal cancer Neg Hx    Stomach cancer Neg Hx  Social History   Socioeconomic History   Marital status: Married    Spouse name: Yaw   Number of children: 3   Years of education: BSN   Highest education level: Not on file  Occupational History   Occupation: Teacher, adult education: [employer]  Tobacco Use   Smoking status: Never    Passive exposure: Never   Smokeless tobacco: Never  Vaping Use   Vaping status: Never Used  Substance and Sexual Activity   Alcohol use: Not Currently    Comment: once a month    Drug use: No   Sexual activity: Yes    Partners: Male    Birth control/protection: Post-menopausal  Other Topics Concern   Not on file  Social History Narrative   Originally from [place].   Has lived in area for 14 years.   3 children.    Works as a Engineer, civil (consulting) for [employer].        05/09/20   From: [place] - moved to the [place] in 2000   Living: with husband, Yaw (2007) - 2 children at home   Work: [employer] - all the hospital      Family: 3 children - [place], Sharia Reeve, Vicente Serene      Enjoys: movies, music, shopping      Exercise: lift weights, walking   Diet: fruit, veggies, country Social research officer, government belts: Yes    Guns: No   Safe in relationships: Yes    Social Drivers of Corporate investment banker Strain: Not on file  Food Insecurity: Not on file  Transportation Needs: Not on file  Physical Activity: Not on file  Stress: Not on file  Social Connections: Not on file  Intimate Partner Violence: Not on file   Review of Systems  Constitutional:  Negative for fatigue.       Wears seat belt  HENT:  Negative for dental problem, hearing loss, tinnitus and trouble swallowing.        Keeps up with dentist  Eyes:  Negative for visual disturbance.       No diplopia or unilateral vision loss  Respiratory:  Negative for chest tightness and shortness of breath.        Some cough --mostly in sleep  Cardiovascular:  Positive for palpitations. Negative for chest pain and leg swelling.  Gastrointestinal:  Negative for blood in stool and constipation.       Getting lots of heartburn--doesn't take the famotidine daily  Endocrine: Negative for polydipsia and polyuria.  Genitourinary:  Positive for dyspareunia. Negative for difficulty urinating and hematuria.       Using estrogen cream now  Musculoskeletal:  Negative for arthralgias and joint swelling.       Low back/waist pain---chronic  Skin:  Negative for rash.  Allergic/Immunologic: Positive for environmental allergies. Negative for immunocompromised state.  Neurological:  Positive for dizziness. Negative for syncope, light-headedness and headaches.  Hematological:  Negative for adenopathy. Does not bruise/bleed easily.  Psychiatric/Behavioral:  Negative for dysphoric mood and sleep  disturbance. The patient is not nervous/anxious.        Objective:   Physical Exam Constitutional:      Appearance: Normal appearance.  HENT:     Mouth/Throat:     Pharynx: No oropharyngeal exudate or posterior oropharyngeal erythema.  Eyes:     Conjunctiva/sclera: Conjunctivae normal.     Pupils: Pupils are equal, round, and reactive to light.  Cardiovascular:     Rate  and Rhythm: Normal rate and regular rhythm.     Pulses: Normal pulses.     Heart sounds: No murmur heard.    No gallop.  Pulmonary:     Effort: Pulmonary effort is normal.     Breath sounds: Normal breath sounds. No wheezing or rales.  Abdominal:     Palpations: Abdomen is soft.     Tenderness: There is no abdominal tenderness.  Musculoskeletal:     Cervical back: Neck supple.     Right lower leg: No edema.     Left lower leg: No edema.  Lymphadenopathy:     Head:     Right side of head: No submental, submandibular or tonsillar adenopathy.     Left side of head: No submental, submandibular or tonsillar adenopathy.     Cervical: No cervical adenopathy.     Upper Body:     Right upper body: No supraclavicular or axillary adenopathy.     Left upper body: No supraclavicular or axillary adenopathy.     Lower Body: No right inguinal adenopathy. No left inguinal adenopathy.  Skin:    Findings: No rash.  Neurological:     General: No focal deficit present.     Mental Status: She is alert and oriented to person, place, and time.  Psychiatric:        Mood and Affect: Mood normal.        Behavior: Behavior normal.            Assessment & Plan:

## 2023-09-10 ENCOUNTER — Encounter: Payer: Self-pay | Admitting: Internal Medicine

## 2023-09-10 ENCOUNTER — Other Ambulatory Visit: Payer: Self-pay

## 2023-09-10 LAB — HEPATITIS C ANTIBODY: Hepatitis C Ab: NONREACTIVE

## 2023-09-10 MED FILL — Famotidine Tab 40 MG: ORAL | 30 days supply | Qty: 30 | Fill #0 | Status: AC

## 2023-09-11 ENCOUNTER — Other Ambulatory Visit: Payer: Self-pay

## 2023-09-11 DIAGNOSIS — J301 Allergic rhinitis due to pollen: Secondary | ICD-10-CM | POA: Diagnosis not present

## 2023-09-12 ENCOUNTER — Other Ambulatory Visit: Payer: Self-pay

## 2023-09-15 DIAGNOSIS — J301 Allergic rhinitis due to pollen: Secondary | ICD-10-CM | POA: Diagnosis not present

## 2023-09-22 DIAGNOSIS — J301 Allergic rhinitis due to pollen: Secondary | ICD-10-CM | POA: Diagnosis not present

## 2023-09-25 DIAGNOSIS — J301 Allergic rhinitis due to pollen: Secondary | ICD-10-CM | POA: Diagnosis not present

## 2023-09-30 DIAGNOSIS — J301 Allergic rhinitis due to pollen: Secondary | ICD-10-CM | POA: Diagnosis not present

## 2023-10-02 ENCOUNTER — Other Ambulatory Visit: Payer: Self-pay

## 2023-10-06 ENCOUNTER — Other Ambulatory Visit: Payer: Self-pay

## 2023-10-06 DIAGNOSIS — J301 Allergic rhinitis due to pollen: Secondary | ICD-10-CM | POA: Diagnosis not present

## 2023-10-06 MED FILL — Fluocinolone Acetonide (Otic) Oil 0.01%: OTIC | 30 days supply | Qty: 20 | Fill #1 | Status: AC

## 2023-10-06 MED FILL — Fluocinolone Acetonide (Otic) Oil 0.01%: OTIC | 30 days supply | Qty: 20 | Fill #1 | Status: CN

## 2023-10-07 ENCOUNTER — Other Ambulatory Visit: Payer: Self-pay

## 2023-10-07 ENCOUNTER — Ambulatory Visit: Payer: 59 | Admitting: Dermatology

## 2023-10-09 DIAGNOSIS — J301 Allergic rhinitis due to pollen: Secondary | ICD-10-CM | POA: Diagnosis not present

## 2023-10-13 DIAGNOSIS — J301 Allergic rhinitis due to pollen: Secondary | ICD-10-CM | POA: Diagnosis not present

## 2023-10-16 DIAGNOSIS — J301 Allergic rhinitis due to pollen: Secondary | ICD-10-CM | POA: Diagnosis not present

## 2023-10-20 DIAGNOSIS — J301 Allergic rhinitis due to pollen: Secondary | ICD-10-CM | POA: Diagnosis not present

## 2023-10-23 ENCOUNTER — Other Ambulatory Visit: Payer: Self-pay

## 2023-10-23 MED FILL — Famotidine Tab 40 MG: ORAL | 30 days supply | Qty: 30 | Fill #1 | Status: AC

## 2023-10-28 ENCOUNTER — Other Ambulatory Visit: Payer: Self-pay

## 2023-10-28 DIAGNOSIS — J301 Allergic rhinitis due to pollen: Secondary | ICD-10-CM | POA: Diagnosis not present

## 2023-10-28 MED ORDER — "BD TB SYRINGE 27G X 1/2"" 0.5 ML MISC"
4 refills | Status: DC
  Filled 2023-10-28: qty 25, 175d supply, fill #0
  Filled 2023-10-28: qty 25, 25d supply, fill #0

## 2023-12-16 ENCOUNTER — Other Ambulatory Visit: Payer: Self-pay

## 2023-12-16 ENCOUNTER — Other Ambulatory Visit: Payer: Self-pay | Admitting: Physician Assistant

## 2023-12-16 DIAGNOSIS — R140 Abdominal distension (gaseous): Secondary | ICD-10-CM

## 2023-12-16 DIAGNOSIS — R142 Eructation: Secondary | ICD-10-CM

## 2023-12-16 MED ORDER — FAMOTIDINE 40 MG PO TABS
40.0000 mg | ORAL_TABLET | Freq: Every day | ORAL | 1 refills | Status: AC
  Filled 2023-12-16: qty 30, 30d supply, fill #0
  Filled 2024-03-02: qty 30, 30d supply, fill #1

## 2024-02-26 ENCOUNTER — Ambulatory Visit
Admission: EM | Admit: 2024-02-26 | Discharge: 2024-02-26 | Disposition: A | Discharge status: Home / Self Care | Attending: Emergency Medicine | Admitting: Emergency Medicine

## 2024-02-26 DIAGNOSIS — H60502 Unspecified acute noninfective otitis externa, left ear: Secondary | ICD-10-CM | POA: Diagnosis not present

## 2024-02-26 DIAGNOSIS — H9202 Otalgia, left ear: Secondary | ICD-10-CM

## 2024-02-26 MED ORDER — CIPROFLOXACIN-DEXAMETHASONE 0.3-0.1 % OT SUSP: 4.0000 [drp] | Freq: Two times a day (BID) | OTIC | 0 refills | Status: DC

## 2024-02-26 NOTE — ED Provider Notes (Signed)
 Jennifer Ware    CSN: [id number] Arrival date & time: 02/26/24  1913      History   Chief Complaint Chief Complaint  Patient presents with   Otalgia    HPI Jennifer Ware is a 53 y.o. female.   Patient presents with 4-day history of left ear pain and itching along with muffled hearing.  She took ibuprofen this morning.  No fever, ear drainage, sore throat, cough, shortness of breath.  She has been treating her symptoms with fluocinolone  oil that was prescribed last year by an ENT.  The history is provided by the patient and medical records.    Past Medical History:  Diagnosis Date   Abdominal pain 10/29/2011   Amenorrhea 10/22/2011   Anxiety    Chronic kidney disease    chronic UTI    Chronic UTI 12/24/2011   Dyspareunia 04/19/2014   GERD (gastroesophageal reflux disease)    HTN (hypertension) 04/25/2014   Hx gestational diabetes    Irregular periods/menstrual cycles 03/04/2014   Knee crepitus 07/27/2014   Low back pain 06/07/2011   Osteoarthritis of both knees 08/07/2016   Otitis externa 06/07/2011   Palpitations    Sciatica of left side 09/01/2017   SI (sacroiliac) joint dysfunction 09/01/2017   Thyroid  disease     Patient Active Problem List   Diagnosis Date Noted   Weight loss 09/09/2023   IBS (irritable bowel syndrome) 09/03/2022   Synovial cyst of knee 03/26/2022   Post-menopausal bleeding 02/14/2021   Sebaceous cyst 08/16/2020   Postcoital UTI 05/09/2020   Bilateral foot pain 08/16/2019   Hot flashes 08/16/2019   SI (sacroiliac) joint dysfunction 09/01/2017   Sciatica of left side 09/01/2017   Osteoarthritis of both knees 08/07/2016   Gastroesophageal reflux disease 08/07/2016   Routine general medical examination at a health care facility 06/07/2011    Past Surgical History:  Procedure Laterality Date   BREAST BIOPSY Right 08/24/2014   negative, u/s core   CESAREAN SECTION     x3   HERNIA REPAIR     umbilical   UMBILICAL HERNIA  REPAIR      OB History     Gravida  3   Para  3   Term  3   Preterm      AB      Living  3      SAB      IAB      Ectopic      Multiple      Live Births               Home Medications    Prior to Admission medications   Medication Sig Start Date End Date Taking? Authorizing Provider  ciprofloxacin -dexamethasone  (CIPRODEX ) OTIC suspension Place 4 drops into the left ear 2 (two) times daily. 02/26/24  Yes Corlis Burnard DEL, NP  AMBULATORY NON FORMULARY MEDICATION Thigh-high, medium compression, graduated compression stockings. Apply to lower extremities. [email or web address], Zippered Compression Stockings, medium circ, long length 08/18/19   Aron, Talia M, MD  Ascorbic Acid (VITAMIN C) 1000 MG tablet Take 1,000 mg by mouth daily.    [provider]  Azelaic Acid  15 % gel Apply twice daily to face 12/12/22   Moye, [place] , MD  Calcium 600-200 MG-UNIT per tablet Take 2 tablets by mouth daily.    [provider]  clotrimazole -betamethasone  (LOTRISONE ) cream Apply a small amount topically to each ear twice weekly as needed for itching 06/13/23     cyanocobalamin 1000  MCG tablet Take 100 mcg by mouth daily.    [provider]  EPINEPHrine  (EPIPEN  2-PAK) 0.3 mg/0.3 mL IJ SOAJ injection Inject into thigh once if needed for severe allergic reaction. 07/03/23     estradiol  (ESTRACE ) 0.1 MG/GM vaginal cream Estrogen Cream Instruction  Discard applicator  Apply pea sized amount to tip of finger to urethra before bed. Wash hands well after application. Use Monday, Wednesday and Friday 12/30/22   Jimmy Charlie FERNS, MD  famotidine  (PEPCID ) 40 MG tablet Take 1 tablet (40 mg total) by mouth at bedtime. 12/16/23   Craig Alan SAUNDERS, PA-C  hydroquinone  4 % cream Apply 1 application topically 2 (two) times daily to dark spots on face. 06/02/23   Jackquline Sawyer, MD  Tuberculin-Allergy  Syringes (B-D TB SYRINGE .5CC/27GX1/2) 27G X 1/2 0.5 ML MISC Inject under the  skin once a week. 10/28/23       Family History Family History  Problem Relation Age of Onset   Asthma Father    Colon cancer Neg Hx    Breast cancer Neg Hx    Colon polyps Neg Hx    Esophageal cancer Neg Hx    Rectal cancer Neg Hx    Stomach cancer Neg Hx     Social History Social History   Tobacco Use   Smoking status: Never    Passive exposure: Never   Smokeless tobacco: Never  Vaping Use   Vaping status: Never Used  Substance Use Topics   Alcohol use: Not Currently    Comment: once a month    Drug use: No     Allergies   Flagyl [metronidazole], Lactose intolerance (gi), Meloxicam , Silver sulfadiazine, Sulfa antibiotics, and Yeast-derived drug products   Review of Systems Review of Systems  Constitutional:  Negative for chills and fever.  HENT:  Positive for ear pain. Negative for ear discharge and sore throat.   Respiratory:  Negative for cough and shortness of breath.      Physical Exam Triage Vital Signs ED Triage Vitals  Encounter Vitals Group     BP      Girls Systolic BP Percentile      Girls Diastolic BP Percentile      Boys Systolic BP Percentile      Boys Diastolic BP Percentile      Pulse      Resp      Temp      Temp src      SpO2      Weight      Height      Head Circumference      Peak Flow      Pain Score      Pain Loc      Pain Education      Exclude from Growth Chart    No data found.  Updated Vital Signs BP 116/82   Pulse 84   Temp 97.8 F (36.6 C)   Resp 18   LMP 09/04/2018   SpO2 99%   Visual Acuity Right Eye Distance:   Left Eye Distance:   Bilateral Distance:    Right Eye Near:   Left Eye Near:    Bilateral Near:     Physical Exam Constitutional:      General: She is not in acute distress. HENT:     Right Ear: Tympanic membrane and ear canal normal.     Left Ear: Tympanic membrane normal.     Ears:     Comments: Left ear canal  tender with scant purulent drainage.    Nose: Nose normal.      Mouth/Throat:     Mouth: Mucous membranes are moist.     Pharynx: Oropharynx is clear.  Cardiovascular:     Rate and Rhythm: Normal rate and regular rhythm.     Heart sounds: Normal heart sounds.  Pulmonary:     Effort: Pulmonary effort is normal. No respiratory distress.     Breath sounds: Normal breath sounds.  Neurological:     Mental Status: She is alert.      UC Treatments / Results  Labs (all labs ordered are listed, but only abnormal results are displayed) Labs Reviewed - No data to display  EKG   Radiology No results found.  Procedures Procedures (including critical care time)  Medications Ordered in UC Medications - No data to display  Initial Impression / Assessment and Plan / UC Course  I have reviewed the triage vital signs and the nursing notes.  Pertinent labs & imaging results that were available during my care of the patient were reviewed by me and considered in my medical decision making (see chart for details).   Left otalgia and otitis externa.  Treating with Ciprodex  eardrops.  Instructed patient to stop the eardrops she is currently using.  Education provided on otalgia and otitis externa.  Instructed patient to follow-up with her PCP or ENT if her symptoms are not improving.  She agrees to plan of care.   Final Clinical Impressions(s) / UC Diagnoses   Final diagnoses:  Acute otalgia, left  Acute otitis externa of left ear, unspecified type     Discharge Instructions      Use the Ciprodex  eardrops as directed.  Follow-up with your primary care provider or ENT if your symptoms are not improving.      ED Prescriptions     Medication Sig Dispense Auth. Provider   ciprofloxacin -dexamethasone  (CIPRODEX ) OTIC suspension Place 4 drops into the left ear 2 (two) times daily. 7.5 mL Corlis Burnard DEL, NP      PDMP not reviewed this encounter.   Corlis Burnard DEL, NP 02/26/24 1943

## 2024-02-26 NOTE — ED Triage Notes (Signed)
 Patient to Urgent Care with complaints of  left sided ear pulsating pain/ itching. Reports muffled hearing and fullness.   Symptoms x4 days. Denies any fevers.  Alternating motrin/ tylenol .

## 2024-02-26 NOTE — Discharge Instructions (Addendum)
 Use the Ciprodex  eardrops as directed.  Follow-up with your primary care provider or ENT if your symptoms are not improving.

## 2024-03-09 ENCOUNTER — Other Ambulatory Visit: Payer: Self-pay

## 2024-03-09 DIAGNOSIS — H6122 Impacted cerumen, left ear: Secondary | ICD-10-CM | POA: Diagnosis not present

## 2024-03-09 DIAGNOSIS — H6240 Otitis externa in other diseases classified elsewhere, unspecified ear: Secondary | ICD-10-CM | POA: Diagnosis not present

## 2024-03-09 DIAGNOSIS — L299 Pruritus, unspecified: Secondary | ICD-10-CM | POA: Diagnosis not present

## 2024-03-09 DIAGNOSIS — B369 Superficial mycosis, unspecified: Secondary | ICD-10-CM | POA: Diagnosis not present

## 2024-03-09 MED ORDER — HYDROCORTISONE-ACETIC ACID 1-2 % OT SOLN
4.0000 [drp] | Freq: Four times a day (QID) | OTIC | 1 refills | Status: AC
  Filled 2024-03-09: qty 10, 13d supply, fill #0

## 2024-03-10 ENCOUNTER — Other Ambulatory Visit: Payer: Self-pay

## 2024-03-17 DIAGNOSIS — H6122 Impacted cerumen, left ear: Secondary | ICD-10-CM | POA: Diagnosis not present

## 2024-03-17 DIAGNOSIS — L299 Pruritus, unspecified: Secondary | ICD-10-CM | POA: Diagnosis not present

## 2024-03-17 DIAGNOSIS — H6240 Otitis externa in other diseases classified elsewhere, unspecified ear: Secondary | ICD-10-CM | POA: Diagnosis not present

## 2024-03-17 DIAGNOSIS — B369 Superficial mycosis, unspecified: Secondary | ICD-10-CM | POA: Diagnosis not present

## 2024-04-06 ENCOUNTER — Other Ambulatory Visit: Payer: Self-pay

## 2024-04-06 DIAGNOSIS — B369 Superficial mycosis, unspecified: Secondary | ICD-10-CM | POA: Diagnosis not present

## 2024-04-06 DIAGNOSIS — H6242 Otitis externa in other diseases classified elsewhere, left ear: Secondary | ICD-10-CM | POA: Diagnosis not present

## 2024-04-06 DIAGNOSIS — H608X3 Other otitis externa, bilateral: Secondary | ICD-10-CM | POA: Diagnosis not present

## 2024-04-06 DIAGNOSIS — H6122 Impacted cerumen, left ear: Secondary | ICD-10-CM | POA: Diagnosis not present

## 2024-04-06 MED ORDER — TRIAMCINOLONE ACETONIDE 0.5 % EX CREA
TOPICAL_CREAM | CUTANEOUS | 2 refills | Status: AC | PRN
  Filled 2024-04-06: qty 15, 30d supply, fill #0
  Filled 2024-05-19: qty 15, 30d supply, fill #1

## 2024-04-06 MED ORDER — FLUOCINOLONE ACETONIDE 0.01 % OT OIL
1.0000 [drp] | TOPICAL_OIL | Freq: Every day | OTIC | 2 refills | Status: DC | PRN
  Filled 2024-04-06 – 2024-05-27 (×2): qty 20, 30d supply, fill #0

## 2024-04-07 ENCOUNTER — Other Ambulatory Visit: Payer: Self-pay

## 2024-05-06 ENCOUNTER — Telehealth: Payer: Self-pay | Admitting: Internal Medicine

## 2024-05-06 NOTE — Telephone Encounter (Signed)
 Copied from CRM [id number]. Topic: General - Other >> May 06, 2024  4:19 PM Alfonso HERO wrote: Reason for CRM: Patient calling to see if she can get a form for to the be exempt from having to get the FLU Vaccine. Please call her on the mobile number listed in her chart.

## 2024-05-06 NOTE — Telephone Encounter (Signed)
 Copied from CRM [id number]. Topic: General - Other >> May 06, 2024  4:19 PM Jennifer Ware wrote: Reason for CRM: Patient calling to see if she can get a form for to the be exempt from having to get the FLU Vaccine. Please call her on the mobile number listed in her chart.

## 2024-05-07 NOTE — Telephone Encounter (Signed)
 Pt  called back and stated that she was only getting it because it was mandatory but she has been getting sick from it but she was informed that she can be exempt if she has a doctor's order. Please f/u and advise.

## 2024-05-07 NOTE — Telephone Encounter (Signed)
 Left message on VM asking pt to call back. We need to know why she is needing an exemption. She has been getting the flu shot. There are only a few things that can be exemptions.

## 2024-05-11 NOTE — Telephone Encounter (Signed)
 Spoke to pt to see how she wanted to get the note. She said there is a form that needs to be filled out. She said she will bring it to the office if she cannot get attached on MyChart.

## 2024-05-12 NOTE — Telephone Encounter (Signed)
 We were able to print forms out.  Placed in Dr. Sherrel office in box to complete.

## 2024-05-12 NOTE — Telephone Encounter (Signed)
 This is not able to print. I have found form on [place] at work and printed off. The original request was sent to Dr. Avelina and approved by her have placed in her folder for review and wills end this message to her as well.

## 2024-05-13 ENCOUNTER — Telehealth: Payer: Self-pay | Admitting: Internal Medicine

## 2024-05-13 NOTE — Telephone Encounter (Unsigned)
 Copied from CRM [id number]. Topic: General - Other >> May 06, 2024  4:19 PM Alfonso HERO wrote: Reason for CRM: Patient calling to see if she can get a form for to the be exempt from having to get the FLU Vaccine. Please call her on the mobile number listed in her chart.

## 2024-05-13 NOTE — Telephone Encounter (Signed)
 Copied from CRM [id number]. Topic: General - Other >> May 06, 2024  4:19 PM Alfonso HERO wrote: Reason for CRM: Patient calling to see if she can get a form for to the be exempt from having to get the FLU Vaccine. Please call her on the mobile number listed in her chart. >> May 13, 2024 12:14 PM Drema MATSU wrote: Patient is calling to follow up on exemption form.

## 2024-05-13 NOTE — Telephone Encounter (Signed)
 Per previous message. MyChart message was sent to pt that they are up front ready for pickup. We cannot add them to MyChart. Advised her she can pick them up M-F from [phone]. Left message on VM for pt.

## 2024-05-14 NOTE — Telephone Encounter (Signed)
Pt came by and picked up form

## 2024-05-20 ENCOUNTER — Other Ambulatory Visit: Payer: Self-pay

## 2024-05-27 ENCOUNTER — Other Ambulatory Visit: Payer: Self-pay | Admitting: Family Medicine

## 2024-05-27 ENCOUNTER — Other Ambulatory Visit: Payer: Self-pay

## 2024-05-27 DIAGNOSIS — K219 Gastro-esophageal reflux disease without esophagitis: Secondary | ICD-10-CM

## 2024-05-28 ENCOUNTER — Other Ambulatory Visit: Payer: Self-pay

## 2024-05-31 ENCOUNTER — Other Ambulatory Visit: Payer: Self-pay | Admitting: Family Medicine

## 2024-05-31 ENCOUNTER — Other Ambulatory Visit: Payer: Self-pay

## 2024-05-31 DIAGNOSIS — K219 Gastro-esophageal reflux disease without esophagitis: Secondary | ICD-10-CM

## 2024-06-02 ENCOUNTER — Other Ambulatory Visit: Payer: Self-pay

## 2024-06-03 ENCOUNTER — Other Ambulatory Visit: Payer: Self-pay | Admitting: Family Medicine

## 2024-06-03 ENCOUNTER — Other Ambulatory Visit: Payer: Self-pay

## 2024-06-03 DIAGNOSIS — K219 Gastro-esophageal reflux disease without esophagitis: Secondary | ICD-10-CM

## 2024-06-04 ENCOUNTER — Other Ambulatory Visit: Payer: Self-pay

## 2024-06-07 ENCOUNTER — Other Ambulatory Visit: Payer: Self-pay

## 2024-06-07 ENCOUNTER — Other Ambulatory Visit: Payer: Self-pay | Admitting: Family Medicine

## 2024-06-07 DIAGNOSIS — K219 Gastro-esophageal reflux disease without esophagitis: Secondary | ICD-10-CM

## 2024-06-08 ENCOUNTER — Other Ambulatory Visit: Payer: Self-pay

## 2024-06-10 ENCOUNTER — Other Ambulatory Visit: Payer: Self-pay

## 2024-06-11 ENCOUNTER — Other Ambulatory Visit: Payer: Self-pay

## 2024-06-14 ENCOUNTER — Other Ambulatory Visit: Payer: Self-pay

## 2024-06-15 ENCOUNTER — Ambulatory Visit (INDEPENDENT_AMBULATORY_CARE_PROVIDER_SITE_OTHER): Admitting: Allergy

## 2024-06-15 ENCOUNTER — Encounter: Payer: Self-pay | Admitting: Allergy

## 2024-06-15 ENCOUNTER — Other Ambulatory Visit: Payer: Self-pay

## 2024-06-15 VITALS — BP 102/62 | HR 84 | Temp 98.6°F | Resp 16 | Ht 62.6 in | Wt 142.5 lb

## 2024-06-15 DIAGNOSIS — Z888 Allergy status to other drugs, medicaments and biological substances status: Secondary | ICD-10-CM | POA: Diagnosis not present

## 2024-06-15 DIAGNOSIS — T50905A Adverse effect of unspecified drugs, medicaments and biological substances, initial encounter: Secondary | ICD-10-CM

## 2024-06-15 DIAGNOSIS — L299 Pruritus, unspecified: Secondary | ICD-10-CM | POA: Diagnosis not present

## 2024-06-15 NOTE — Progress Notes (Signed)
 New Patient Note  RE: Jennifer Ware MRN: [id number] DOB: [birth year] Date of Office Visit: 06/15/2024  Consult requested by: Jimmy Charlie FERNS, MD Primary care provider: Jimmy Charlie FERNS, MD  Chief Complaint: Establish Care (She states she had reaction of flu shot - itching and chill, and she needs exemption of work. )  History of Present Illness: I had the pleasure of seeing Jennifer Ware for initial evaluation at the [facility] on 06/15/2024. She is a 53 y.o. female, who is referred here by Jimmy Charlie FERNS, MD for the evaluation of flu vaccine issues.  Discussed the use of AI scribe software for clinical note transcription with the patient, who gave verbal consent to proceed.    For the past five years, she has experienced issues after getting the flu vaccine, including generalized itching that becomes intense, particularly affecting her whole body and ears. These symptoms typically begin the day after vaccination and can last for weeks to months. She denies having large localized reactions at the injection site. She does admit to having some baseline pruritus even without the vaccines though with no clear trigger identified. She also reports dyspnea on exertion and wheezing on one occasion, although it did not require emergency care. Additionally, she experiences prolonged chills following the flu vaccine.  She has been receiving the flu vaccine for years due to job requirements, despite these symptoms. She manages the symptoms with Benadryl and Tylenol , and occasionally uses over-the-counter 1% prednisone for itching. She has not been advised to premedicate with Zyrtec or other antihistamines before receiving the vaccine.  She has a history of generalized itching and has undergone allergy  testing at an ENT clinic, where she received allergy  shots from November last year until June, but discontinued due to lack of improvement. She avoids certain foods like milk,  peanuts, and sugary items due to adverse reactions such as chills and leg pain. She has no history of asthma or regular inhaler use, and no significant issues with other vaccines except for mild chills with the COVID vaccine.  She avoids eating out due to concerns about food reactions. She has no known issues with latex, hives, or eczema, although an ENT suggested she might have eczema due to intense ear itching. She recalls a severe reaction to a bee sting in childhood but has not experienced similar incidents recently. She does not have pets and reports no recent fevers or chills, maintaining normal eating and bathroom habits.      Symptoms persisted for weeks.  Treatment included: benadryl, tylenol  with some benefit.  Mucosal involvement: no.  Previous work up includes: none. Previous history of rash/hives: no. Previous history of drug reactions: has some side effects with flagyl, mobic , silvadene.  She had some chills with covid vaccine.  Patient has history of itching and was on AIT by ENT for allergic rhinitis with no benefit.  No issues with latex.   Assessment and Plan: Bethann is a 53 y.o. female with: Concerns for influenza vaccine reaction Discussed at length that some of her symptoms are not typical of an IgE mediated allergic reaction to the vaccine and the timeline of events seem delayed and prolonged. Patient already has some underlying pruritus which seems to hasten afterwards. No issues with other vaccines, latex.  Our office does offer skin prick testing to vaccines which only checks for the IgE mediated reactions. Sometimes premedicating with zyrtec 10mg  twice a day, pepcid  20mg  twice a day and take ibuprofen the day  before, the day of and the day after injections can help to lessen the symptoms. True anaphylaxis to vaccines are extremely rare and usually occurs within 30 min to a few hours of vaccine administration.   Offered skin prick testing and vaccine challenge in  the office. You must be off antihistamines for 3-5 days before. Must be in good health and not ill. No vaccines/injections/antibiotics within the past 7 days. Plan on being in the office for 2-3 hours. You must call to schedule an appointment and specify it's for a flu vaccine skin testing and challenge.   Chronic pruritus Generalized itching, particularly in ears, with no improvement from previous allergy  testing and immunotherapy. Possible eczema suggested by ENT. Monitor symptoms. Consider labs in future if interested in further evaluation.   Return if symptoms worsen or fail to improve.  No orders of the defined types were placed in this encounter.  Lab Orders  No laboratory test(s) ordered today    Other allergy  screening: Asthma: no Rhino conjunctivitis: yes Saw ENT and had some type of testing done and was on AIT with no benefit.  Food allergy : Currently avoiding dairy products, peanuts, sugary candy due to leg pain? Making her sick. No issues with eggs.  Hymenoptera allergy :  Some swelling when bit in [place].  Urticaria: no Eczema:no History of recurrent infections suggestive of immunodeficency: no  Diagnostics: None.   Past Medical History: Patient Active Problem List   Diagnosis Date Noted   Weight loss 09/09/2023   IBS (irritable bowel syndrome) 09/03/2022   Synovial cyst of knee 03/26/2022   Post-menopausal bleeding 02/14/2021   Sebaceous cyst 08/16/2020   Postcoital UTI 05/09/2020   Bilateral foot pain 08/16/2019   Hot flashes 08/16/2019   SI (sacroiliac) joint dysfunction 09/01/2017   Sciatica of left side 09/01/2017   Osteoarthritis of both knees 08/07/2016   Gastroesophageal reflux disease 08/07/2016   Routine general medical examination at a health care facility 06/07/2011   Past Medical History:  Diagnosis Date   Abdominal pain 10/29/2011   Amenorrhea 10/22/2011   Anxiety    Chronic kidney disease    chronic UTI    Chronic UTI 12/24/2011    Dyspareunia 04/19/2014   GERD (gastroesophageal reflux disease)    HTN (hypertension) 04/25/2014   Hx gestational diabetes    Irregular periods/menstrual cycles 03/04/2014   Knee crepitus 07/27/2014   Low back pain 06/07/2011   Osteoarthritis of both knees 08/07/2016   Otitis externa 06/07/2011   Palpitations    Sciatica of left side 09/01/2017   SI (sacroiliac) joint dysfunction 09/01/2017   Thyroid  disease    Past Surgical History: Past Surgical History:  Procedure Laterality Date   BREAST BIOPSY Right 08/24/2014   negative, u/s core   CESAREAN SECTION     x3   HERNIA REPAIR     umbilical   UMBILICAL HERNIA REPAIR     Medication List:  Current Outpatient Medications  Medication Sig Dispense Refill   clotrimazole -betamethasone  (LOTRISONE ) cream Apply a small amount topically to each ear twice weekly as needed for itching 15 g 3   estradiol  (ESTRACE ) 0.1 MG/GM vaginal cream Estrogen Cream Instruction  Discard applicator  Apply pea sized amount to tip of finger to urethra before bed. Wash hands well after application. Use Monday, Wednesday and Friday 42.5 g 5   famotidine  (PEPCID ) 40 MG tablet Take 1 tablet (40 mg total) by mouth at bedtime. 30 tablet 1   triamcinolone  cream (KENALOG ) 0.5 % Apply a thin  layer to the affected portion of your outer ear 1-2 times daily as needed for itching/flaking 15 g 2   EPINEPHrine  (EPIPEN  2-PAK) 0.3 mg/0.3 mL IJ SOAJ injection Inject into thigh once if needed for severe allergic reaction. (Patient not taking: Reported on 06/15/2024) 2 each 0   Tuberculin-Allergy  Syringes (B-D TB SYRINGE .5CC/27GX1/2) 27G X 1/2 0.5 ML MISC Inject under the skin once a week. (Patient not taking: Reported on 06/15/2024) 25 each 4   No current facility-administered medications for this visit.   Allergies: Allergies  Allergen Reactions   Flagyl [Metronidazole]    Lactose Intolerance (Gi)    Meloxicam     Silver Sulfadiazine    Sulfa Antibiotics     Yeast-Derived Drug Products    Social History: Social History   Socioeconomic History   Marital status: Married    Spouse name: Yaw   Number of children: 3   Years of education: BSN   Highest education level: Not on file  Occupational History   Occupation: Teacher, Adult Education: [employer]  Tobacco Use   Smoking status: Never    Passive exposure: Never   Smokeless tobacco: Never  Vaping Use   Vaping status: Never Used  Substance and Sexual Activity   Alcohol use: Not Currently    Comment: once a month    Drug use: No   Sexual activity: Yes    Partners: Male    Birth control/protection: Post-menopausal  Other Topics Concern   Not on file  Social History Narrative   Originally from [place].   Has lived in area for 14 years.   3 children.   Works as a engineer, civil (consulting) for [employer].        05/09/20   From: [place] - moved to the [place]  in 2000   Living: with husband, Yaw (2007) - 2 children at home   Work: [employer] - all the hospital      Family: 3 children - Marylin, Sidra, Aloha      Enjoys: movies, music, shopping      Exercise: lift weights, walking   Diet: fruit, veggies, country Social Research Officer, Government belts: Yes    Guns: No   Safe in relationships: Yes    Social Drivers of Corporate Investment Banker Strain: Not on file  Food Insecurity: Not on file  Transportation Needs: Not on file  Physical Activity: Not on file  Stress: Not on file  Social Connections: Not on file   Lives in a house. 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  HENT:  Negative for congestion and rhinorrhea.   Eyes:  Negative for itching.  Respiratory:  Negative for cough, chest tightness, shortness of breath and wheezing.   Cardiovascular:  Negative for chest pain.  Gastrointestinal:  Negative for abdominal pain.  Genitourinary:  Negative for difficulty urinating.  Skin:  Negative for rash.  Neurological:  Negative for headaches.    Objective: BP 102/62 (BP Location: Left Arm, Patient Position: Sitting, Cuff Size: Normal)   Pulse 84   Temp 98.6 F (37 C) (Temporal)   Resp 16   Ht 5' 2.6 (1.59 m)   Wt 142 lb 8 oz (64.6 kg)   LMP 09/04/2018   SpO2 98%   BMI 25.57 kg/m  Body mass index is 25.57 kg/m. Physical Exam Vitals and nursing note reviewed.  Constitutional:      Appearance: Normal appearance. She is well-developed.  HENT:     Head: Normocephalic and atraumatic.     Right Ear: Tympanic membrane and external ear normal.     Left Ear: Tympanic membrane and external ear normal.     Nose: Nose normal.     Mouth/Throat:     Mouth: Mucous membranes are moist.     Pharynx: Oropharynx is clear.  Eyes:     Conjunctiva/sclera: Conjunctivae normal.  Cardiovascular:     Rate and Rhythm: Normal rate and regular rhythm.     Heart sounds: Normal heart sounds. No murmur heard.    No friction rub. No gallop.  Pulmonary:     Effort: Pulmonary effort is normal.     Breath sounds: Normal breath sounds. No wheezing, rhonchi or rales.  Musculoskeletal:     Cervical back: Neck supple.  Skin:    General: Skin is warm.     Findings: No rash.  Neurological:     Mental Status: She is alert and oriented to person, place, and time.  Psychiatric:        Behavior: Behavior normal.    The plan was reviewed with the patient/family, and all questions/concerned were addressed.  It was my pleasure to see Atiana today and participate in her care. Please feel free to contact me with any questions or  concerns.  Sincerely,  Orlan Cramp, DO Allergy  & Immunology  [facility]  [place] office: [phone] [facility] office: [phone]

## 2024-06-15 NOTE — Patient Instructions (Addendum)
 Flu vaccine Some of your symptoms are not typical of an allergic reaction. I can only check for IgE mediated reaction to flu vaccine via skin testing.  You can also try to premedicate with zyrtec 10mg  twice a day, pepcid  20mg  twice a day and take ibuprofen the day before, the day of and the day after injections to minimize symptoms.  Consider flu vaccine skin testing and in office drug challenge in the future.  You must be off antihistamines for 3-5 days before. Must be in good health and not ill. No vaccines/injections/antibiotics within the past 7 days. Plan on being in the office for 2-3 hours. You must call to schedule an appointment and specify it's for a flu vaccine skin testing and challenge.   Follow up as needed.

## 2024-06-22 ENCOUNTER — Ambulatory Visit (INDEPENDENT_AMBULATORY_CARE_PROVIDER_SITE_OTHER)

## 2024-06-22 DIAGNOSIS — Z23 Encounter for immunization: Secondary | ICD-10-CM | POA: Diagnosis not present

## 2024-06-22 NOTE — Progress Notes (Signed)
 Per orders of Dr. Reuben Burkes , injection of Flu Vaccine given by Nellie Hummer in left deltoid. Patient tolerated injection well.

## 2024-06-24 ENCOUNTER — Ambulatory Visit: Admitting: Allergy & Immunology

## 2024-08-13 ENCOUNTER — Other Ambulatory Visit: Payer: Self-pay

## 2024-08-13 DIAGNOSIS — L700 Acne vulgaris: Secondary | ICD-10-CM

## 2024-08-13 MED ORDER — ESTRADIOL 0.01 % VA CREA
TOPICAL_CREAM | VAGINAL | 0 refills | Status: AC
  Filled 2024-08-13: qty 42.5, 90d supply, fill #0

## 2024-08-13 MED ORDER — TRETINOIN 0.1 % EX CREA
1.0000 | TOPICAL_CREAM | Freq: Every day | CUTANEOUS | 0 refills | Status: AC
  Filled 2024-08-13: qty 45, 60d supply, fill #0

## 2024-08-24 ENCOUNTER — Other Ambulatory Visit: Payer: Self-pay

## 2024-09-09 ENCOUNTER — Encounter

## 2024-09-16 ENCOUNTER — Ambulatory Visit: Payer: Self-pay

## 2024-09-16 ENCOUNTER — Other Ambulatory Visit: Payer: Self-pay | Admitting: Medical Genetics

## 2024-09-16 VITALS — BP 102/70 | HR 74 | Temp 97.9°F | Ht 62.5 in | Wt 140.0 lb

## 2024-09-16 DIAGNOSIS — R300 Dysuria: Secondary | ICD-10-CM

## 2024-09-16 DIAGNOSIS — E7849 Other hyperlipidemia: Secondary | ICD-10-CM

## 2024-09-16 DIAGNOSIS — Z131 Encounter for screening for diabetes mellitus: Secondary | ICD-10-CM

## 2024-09-16 DIAGNOSIS — E559 Vitamin D deficiency, unspecified: Secondary | ICD-10-CM

## 2024-09-16 DIAGNOSIS — N898 Other specified noninflammatory disorders of vagina: Secondary | ICD-10-CM

## 2024-09-16 DIAGNOSIS — D696 Thrombocytopenia, unspecified: Secondary | ICD-10-CM

## 2024-09-16 DIAGNOSIS — E538 Deficiency of other specified B group vitamins: Secondary | ICD-10-CM

## 2024-09-16 LAB — URINALYSIS, ROUTINE W REFLEX MICROSCOPIC
Bilirubin Urine: NEGATIVE
Hgb urine dipstick: NEGATIVE
Ketones, ur: NEGATIVE
Leukocytes,Ua: NEGATIVE
Nitrite: NEGATIVE
Specific Gravity, Urine: 1.015 (ref 1.000–1.030)
Total Protein, Urine: NEGATIVE
Urine Glucose: NEGATIVE
Urobilinogen, UA: 0.2 (ref 0.0–1.0)
pH: 7 (ref 5.0–8.0)

## 2024-09-16 LAB — CBC WITH DIFFERENTIAL/PLATELET
Basophils Absolute: 0 10*3/uL (ref 0.0–0.1)
Basophils Relative: 0.4 % (ref 0.0–3.0)
Eosinophils Absolute: 0.1 10*3/uL (ref 0.0–0.7)
Eosinophils Relative: 2.3 % (ref 0.0–5.0)
HCT: 42 % (ref 36.0–46.0)
Hemoglobin: 14 g/dL (ref 12.0–15.0)
Lymphocytes Relative: 51.1 % — ABNORMAL HIGH (ref 12.0–46.0)
Lymphs Abs: 2.1 10*3/uL (ref 0.7–4.0)
MCHC: 33.3 g/dL (ref 30.0–36.0)
MCV: 86 fl (ref 78.0–100.0)
Monocytes Absolute: 0.4 10*3/uL (ref 0.1–1.0)
Monocytes Relative: 8.4 % (ref 3.0–12.0)
Neutro Abs: 1.6 10*3/uL (ref 1.4–7.7)
Neutrophils Relative %: 37.8 % — ABNORMAL LOW (ref 43.0–77.0)
Platelets: 129 10*3/uL — ABNORMAL LOW (ref 150.0–400.0)
RBC: 4.88 Mil/uL (ref 3.87–5.11)
RDW: 13.5 % (ref 11.5–15.5)
WBC: 4.2 10*3/uL (ref 4.0–10.5)

## 2024-09-16 LAB — LIPID PANEL
Cholesterol: 200 mg/dL (ref 28–200)
HDL: 65.7 mg/dL
LDL Cholesterol: 126 mg/dL — ABNORMAL HIGH (ref 10–99)
NonHDL: 134.29
Total CHOL/HDL Ratio: 3
Triglycerides: 39 mg/dL (ref 10.0–149.0)
VLDL: 7.8 mg/dL (ref 0.0–40.0)

## 2024-09-16 LAB — COMPREHENSIVE METABOLIC PANEL WITH GFR
ALT: 21 U/L (ref 3–35)
AST: 19 U/L (ref 5–37)
Albumin: 4.1 g/dL (ref 3.5–5.2)
Alkaline Phosphatase: 107 U/L (ref 39–117)
BUN: 12 mg/dL (ref 6–23)
CO2: 33 meq/L — ABNORMAL HIGH (ref 19–32)
Calcium: 9.7 mg/dL (ref 8.4–10.5)
Chloride: 103 meq/L (ref 96–112)
Creatinine, Ser: 0.87 mg/dL (ref 0.40–1.20)
GFR: 75.51 mL/min
Glucose, Bld: 87 mg/dL (ref 70–99)
Potassium: 3.9 meq/L (ref 3.5–5.1)
Sodium: 141 meq/L (ref 135–145)
Total Bilirubin: 1 mg/dL (ref 0.2–1.2)
Total Protein: 6.8 g/dL (ref 6.0–8.3)

## 2024-09-16 LAB — HEMOGLOBIN A1C: Hgb A1c MFr Bld: 5.4 % (ref 4.6–6.5)

## 2024-09-16 LAB — POCT URINE DIPSTICK
Bilirubin, UA: NEGATIVE
Blood, UA: NEGATIVE
Glucose, UA: NEGATIVE mg/dL
Ketones, POC UA: NEGATIVE mg/dL
Leukocytes, UA: NEGATIVE
Nitrite, UA: NEGATIVE
POC PROTEIN,UA: NEGATIVE
Spec Grav, UA: 1.02
Urobilinogen, UA: 0.2 U/dL
pH, UA: 6.5

## 2024-09-16 LAB — VITAMIN D 25 HYDROXY (VIT D DEFICIENCY, FRACTURES): VITD: 74.92 ng/mL (ref 30.00–100.00)

## 2024-09-16 LAB — VITAMIN B12: Vitamin B-12: >1500 pg/mL — ABNORMAL HIGH (ref 211–911)

## 2024-09-16 NOTE — Patient Instructions (Signed)
 Thank you for visiting [facility] today!

## 2024-09-16 NOTE — Progress Notes (Signed)
 "   Subjective:   This visit was conducted in person. The patient gave informed consent to the use of Abridge AI technology to record the contents of the encounter as documented below.   Patient ID: Jennifer Ware, female    DOB: 09-12-1970, 54 y.o.   MRN: [id number]   Discussed the use of AI scribe software for clinical note transcription with the patient, who gave verbal consent to proceed.  History of Present Illness Jennifer Ware is a 54 year old female who presents for a follow-up visit.  She experiences intermittent heartburn, which she manages with Pepcid  at bedtime on a PRN basis. She reports that her irritable bowel syndrome is still present and manageable. She reports that her sciatica on the left side is still present but manageable, and that she has arthritis in both knees. She reports no recent bleeding and describes a history of bleeding during her menopausal transition in 2022, which prompted her to seek a checkup at that time.  She has a cyst on her lower back that has been present for years without significant change in size, although it emits a bad smell when squeezed. She has not pursued removal. Additionally, she has a cyst behind her knee that is still present.  She experiences foot pain that is manageable and has improved with exercise. She uses an EpiPen  due to a recommendation from a specialist when receiving certain shots, although she cannot recall the specific shot. She uses Lotrisone  cream for itching in her ears, which was prescribed by an ear specialist. She also uses vaginal cream for dryness as needed.  She reports a recent episode of abnormal discharge and occasional symptoms suggestive of a urinary tract infection, such as burning during urination and increased frequency under stress. Her husband, who is allergic to yeast, has previously noticed reactions after intercourse, prompting her to check for yeast infections.  She has a history of thyroid  issues from  over twenty years ago, for which she was on Synthroid, but she has not been diagnosed with any thyroid  problems since then. She takes vitamin D  (5000 IU), vitamin B12, zinc, magnesium , and occasionally calcium. She also takes Percocet.      Review of Systems      Allergies[1]  Medications Ordered Prior to Encounter[2]  BP 102/70 (BP Location: Left Arm, Patient Position: Sitting, Cuff Size: Normal)   Pulse 74   Temp 97.9 F (36.6 C) (Oral)   Ht 5' 2.5 (1.588 m)   Wt 140 lb (63.5 kg)   LMP 09/04/2018   SpO2 98%   BMI 25.20 kg/m   Objective:      Physical Exam GENERAL: Alert, cooperative, well developed, no acute distress. HEAD: Normocephalic atraumatic. EYES: Extraocular movements intact BL, pupils round, equal and reactive to light BL, conjunctivae normal BL. EXTREMITIES: No cyanosis or edema. NEUROLOGICAL: Oriented to person, place and time, no gait abnormalities, moves all extremities without gross motor or sensory deficit.         Assessment & Plan:   Assessment & Plan Vaginal discharge Dysuria Thick, creamy discharge raises concern for vaginal candidiasis, ordered wet prep.  Also ordering urine given dysuria to determine needs for antibiotics.  Orders:   Urinalysis, Routine w reflex microscopic   Urine Culture   POCT URINE DIPSTICK   WET PREP BY MOLECULAR PROBE  Thrombocytopenia Noted on multiple prior CBCs, no past workups or interventions, she has ranged between 120s to 140 over the past 3 years, most recently at 129 last  year, no reported history of bleeding, will repeat now, need for further workup TBD based on results.  Will also rule out liver dysfunction as a potential cause using CMP.  Orders:   CBC with Differential   Comprehensive metabolic panel with GFR  Vitamin D  deficiency Currently on supplementation, will order levels for monitoring  - Continue current vitamin D  supplementation Orders:   VITAMIN D  25 Hydroxy (Vit-D Deficiency,  Fractures)  B12 deficiency No recent levels, ordering for monitoring.  Need for supplementation TBD based on results.  Orders:   Vitamin B12  Other hyperlipidemia Noted on lipid panel last year, ASCVD based on those labs was 1%, no statin was indicated.  Will repeat at this time for monitoring.  Orders:   Lipid panel  Diabetes mellitus screening See age-appropriate screening below. Orders:   Hemoglobin A1c       Return in about 4 weeks (around 10/14/2024) for CPE and well woman.   Jennifer Ware Jennifer [name], MD  09/16/24     Contains text generated by Abridge.        [1]  Allergies Allergen Reactions   Flagyl [Metronidazole]    Lactose Intolerance (Gi)    Meloxicam     Silver Sulfadiazine    Sulfa Antibiotics    Yeast-Derived Drug Products   [2]  Current Outpatient Medications on File Prior to Visit  Medication Sig Dispense Refill   cholecalciferol (VITAMIN D3) 25 MCG (1000 UNIT) tablet Take 5,000 Units by mouth daily.     clotrimazole -betamethasone  (LOTRISONE ) cream Apply a small amount topically to each ear twice weekly as needed for itching 15 g 3   EPINEPHrine  (EPIPEN  2-PAK) 0.3 mg/0.3 mL IJ SOAJ injection Inject into thigh once if needed for severe allergic reaction. 2 each 0   estradiol  (ESTRACE ) 0.01 % CREA vaginal cream Apply pea sized amount to tip of finger to urethra before bed. Wash hands well after application. Use Monday, Wednesday and Friday 42.5 g 0   famotidine  (PEPCID ) 40 MG tablet Take 1 tablet (40 mg total) by mouth at bedtime. 30 tablet 1   tretinoin  (RETIN-A ) 0.1 % cream Apply 1 Application topically at bedtime. 45 g 0   triamcinolone  cream (KENALOG ) 0.5 % Apply a thin layer to the affected portion of your outer ear 1-2 times daily as needed for itching/flaking 15 g 2   No current facility-administered medications on file prior to visit.   "

## 2024-09-17 LAB — URINE CULTURE
MICRO NUMBER:: 17553653
Result:: NO GROWTH
SPECIMEN QUALITY:: ADEQUATE

## 2024-09-17 LAB — WET PREP BY MOLECULAR PROBE
Candida species: NOT DETECTED
Gardnerella vaginalis: NOT DETECTED
MICRO NUMBER:: 17553654
SPECIMEN QUALITY:: ADEQUATE
Trichomonas vaginosis: NOT DETECTED

## 2024-09-21 ENCOUNTER — Ambulatory Visit

## 2024-10-13 ENCOUNTER — Ambulatory Visit

## 2024-10-14 ENCOUNTER — Ambulatory Visit

## 2024-10-25 ENCOUNTER — Encounter

## 2025-10-14 ENCOUNTER — Encounter
